# Patient Record
Sex: Female | Born: 1985 | Race: Black or African American | Hispanic: No | Marital: Married | Smoking: Never smoker
Health system: Southern US, Community
[De-identification: ages and names within clinical notes are randomized; demographics above are authoritative.]

## PROBLEM LIST (undated history)

## (undated) ENCOUNTER — Inpatient Hospital Stay (HOSPITAL_COMMUNITY): Payer: Self-pay

## (undated) DIAGNOSIS — F909 Attention-deficit hyperactivity disorder, unspecified type: Secondary | ICD-10-CM

## (undated) DIAGNOSIS — Z8619 Personal history of other infectious and parasitic diseases: Secondary | ICD-10-CM

## (undated) DIAGNOSIS — R519 Headache, unspecified: Secondary | ICD-10-CM

## (undated) DIAGNOSIS — Z8669 Personal history of other diseases of the nervous system and sense organs: Secondary | ICD-10-CM

## (undated) DIAGNOSIS — B019 Varicella without complication: Secondary | ICD-10-CM

## (undated) DIAGNOSIS — R51 Headache: Secondary | ICD-10-CM

## (undated) HISTORY — DX: Attention-deficit hyperactivity disorder, unspecified type: F90.9

## (undated) HISTORY — DX: Varicella without complication: B01.9

## (undated) HISTORY — DX: Headache, unspecified: R51.9

## (undated) HISTORY — DX: Headache: R51

## (undated) HISTORY — PX: CHOLECYSTECTOMY: SHX55

## (undated) SURGERY — Surgical Case
Anesthesia: *Unknown

---

## 2012-03-23 ENCOUNTER — Emergency Department (HOSPITAL_COMMUNITY)
Admission: EM | Admit: 2012-03-23 | Discharge: 2012-03-23 | Disposition: A | Discharge status: Home / Self Care | Payer: Self-pay | Attending: Emergency Medicine | Admitting: Emergency Medicine

## 2012-03-23 ENCOUNTER — Encounter (HOSPITAL_COMMUNITY): Payer: Self-pay | Admitting: Emergency Medicine

## 2012-03-23 DIAGNOSIS — G43909 Migraine, unspecified, not intractable, without status migrainosus: Secondary | ICD-10-CM | POA: Insufficient documentation

## 2012-03-23 HISTORY — DX: Personal history of other diseases of the nervous system and sense organs: Z86.69

## 2012-03-23 MED ORDER — SODIUM CHLORIDE 0.9 % IV BOLUS (SEPSIS)
1000.0000 mL | Freq: Once | INTRAVENOUS | Status: AC
  Administered 2012-03-23: 1000 mL via INTRAVENOUS

## 2012-03-23 MED ORDER — DIPHENHYDRAMINE HCL 50 MG/ML IJ SOLN
25.0000 mg | Freq: Once | INTRAMUSCULAR | Status: AC
  Administered 2012-03-23: 25 mg via INTRAVENOUS
  Filled 2012-03-23: qty 1

## 2012-03-23 MED ORDER — METOCLOPRAMIDE HCL 5 MG/ML IJ SOLN
10.0000 mg | Freq: Once | INTRAMUSCULAR | Status: AC
  Administered 2012-03-23: 10 mg via INTRAVENOUS
  Filled 2012-03-23: qty 2

## 2012-03-23 MED ORDER — SUMATRIPTAN SUCCINATE 50 MG PO TABS: 50.0000 mg | ORAL_TABLET | ORAL | Status: DC | PRN

## 2012-03-23 MED ORDER — KETOROLAC TROMETHAMINE 30 MG/ML IJ SOLN
30.0000 mg | Freq: Once | INTRAMUSCULAR | Status: AC
  Administered 2012-03-23: 30 mg via INTRAVENOUS
  Filled 2012-03-23: qty 1

## 2012-03-23 NOTE — ED Notes (Signed)
Vital signs stable. 

## 2012-03-23 NOTE — ED Notes (Signed)
Anxiety noted upon arrival.

## 2012-03-23 NOTE — ED Notes (Signed)
Family at bedside. 

## 2012-03-23 NOTE — ED Notes (Signed)
MD at bedside. 

## 2012-03-23 NOTE — ED Provider Notes (Signed)
History     CSN: [id number]  Arrival date & time 03/23/12  1144   First MD Initiated Contact with Patient 03/23/12 1147      Chief Complaint  Patient presents with  . Migraine    HPI The patient presents to the emergency room with complaints of migraine headache. She has a history of these headaches and generally just takes ibuprofen. Occasionally the headaches will be more severe like this episode today. She has been to the emergency room for a headache that has been this bad. She denies fever or , vomiting or diarrhea. She does have nausea. This headache started today gradually and has become more severe. It is primarily located on the left side. She's also had some nausea blurred vision and dizziness. Past Medical History  Diagnosis Date  . Hx of migraines     History reviewed. No pertinent past surgical history.  History reviewed. No pertinent family history.  History  Substance Use Topics  . Smoking status: Never Smoker   . Smokeless tobacco: Not on file  . Alcohol Use: No    OB History    Grav Para Term Preterm Abortions TAB SAB Ect Mult Living                  Review of Systems  Constitutional: Negative for fever.  HENT: Negative for neck stiffness.   Neurological: Positive for numbness and headaches. Negative for seizures.  All other systems reviewed and are negative.    Allergies  Review of patient's allergies indicates no known allergies.  Home Medications  No current outpatient prescriptions on file.  BP 130/65  Pulse 79  Resp 16  SpO2 100%  LMP 02/08/2012  Physical Exam  Nursing note and vitals reviewed. Constitutional: She appears well-developed and well-nourished. She appears distressed.  HENT:  Head: Normocephalic and atraumatic.  Right Ear: External ear normal.  Left Ear: External ear normal.  Eyes: Conjunctivae are normal. Right eye exhibits no discharge. Left eye exhibits no discharge. No scleral icterus.  Neck: Neck supple. No  tracheal deviation present.  Cardiovascular: Normal rate, regular rhythm and intact distal pulses.   Pulmonary/Chest: Breath sounds normal. No stridor. No respiratory distress. She has no wheezes. She has no rales.       Hyperventilating  Abdominal: Soft. Bowel sounds are normal. She exhibits no distension. There is no tenderness. There is no rebound and no guarding.  Musculoskeletal: She exhibits no edema and no tenderness.  Neurological: She is alert. She has normal strength. No sensory deficit. Cranial nerve deficit:  no gross defecits noted. She exhibits normal muscle tone. She displays no seizure activity. Coordination normal.       Equal grip strength and plantar flexion strength bilaterally, sensation of light touch is intact throughout  Skin: Skin is warm and dry. No rash noted.  Psychiatric: She has a normal mood and affect.    ED Course  Procedures (including critical care time)  Labs Reviewed - No data to display No results found.   1. Migraine       MDM  Patient is feeling better after treatment at this time.  Her symptoms are consistent with a migraine headache. Doubt subarachnoid hemorrhage, meningitis or other acute neurological emergency. She was discharged home with a prescription for Imitrex. I recommended followup with a primary care Dr.       Celene Kras, MD 03/23/12 1350

## 2012-03-23 NOTE — ED Notes (Signed)
Per EMS: Pt's husband called EMS for wife's migraine.  Pt has hx of same.  Starts with numbness/tingling in mouth.  C/o blurred vision, nausea, dizziness.  Pain is centered over left eye.  Pt states it started when she woke up around 1045.  Took phenergan for nausea.  Denies taking any other meds today.

## 2013-10-18 LAB — OB RESULTS CONSOLE RUBELLA ANTIBODY, IGM: Rubella: IMMUNE

## 2013-10-18 LAB — OB RESULTS CONSOLE RPR: RPR: NONREACTIVE

## 2013-10-18 LAB — OB RESULTS CONSOLE ABO/RH: RH Type: POSITIVE

## 2013-10-18 LAB — OB RESULTS CONSOLE HIV ANTIBODY (ROUTINE TESTING): HIV: NONREACTIVE

## 2013-10-18 LAB — OB RESULTS CONSOLE HEPATITIS B SURFACE ANTIGEN: Hepatitis B Surface Ag: NEGATIVE

## 2013-10-18 LAB — OB RESULTS CONSOLE ANTIBODY SCREEN: Antibody Screen: NEGATIVE

## 2014-01-18 ENCOUNTER — Emergency Department (HOSPITAL_COMMUNITY)
Admission: EM | Admit: 2014-01-18 | Discharge: 2014-01-18 | Disposition: A | Discharge status: Home / Self Care | Payer: Managed Care, Other (non HMO) | Attending: Emergency Medicine | Admitting: Emergency Medicine

## 2014-01-18 ENCOUNTER — Encounter (HOSPITAL_COMMUNITY): Payer: Self-pay | Admitting: Emergency Medicine

## 2014-01-18 DIAGNOSIS — O9989 Other specified diseases and conditions complicating pregnancy, childbirth and the puerperium: Secondary | ICD-10-CM | POA: Insufficient documentation

## 2014-01-18 DIAGNOSIS — Z79899 Other long term (current) drug therapy: Secondary | ICD-10-CM | POA: Insufficient documentation

## 2014-01-18 DIAGNOSIS — G43909 Migraine, unspecified, not intractable, without status migrainosus: Secondary | ICD-10-CM | POA: Insufficient documentation

## 2014-01-18 DIAGNOSIS — K6289 Other specified diseases of anus and rectum: Secondary | ICD-10-CM

## 2014-01-18 LAB — URINALYSIS, ROUTINE W REFLEX MICROSCOPIC
Bilirubin Urine: NEGATIVE
Glucose, UA: NEGATIVE mg/dL
Hgb urine dipstick: NEGATIVE
Ketones, ur: NEGATIVE mg/dL
Leukocytes, UA: NEGATIVE
Nitrite: NEGATIVE
Protein, ur: NEGATIVE mg/dL
Specific Gravity, Urine: 1.021 (ref 1.005–1.030)
Urobilinogen, UA: 0.2 mg/dL (ref 0.0–1.0)
pH: 6.5 (ref 5.0–8.0)

## 2014-01-18 NOTE — Progress Notes (Signed)
Spoke to Dr Harrington Challenger. To keep next scheduled appointment.  Pt has been cleared obstetrically.

## 2014-01-18 NOTE — ED Notes (Signed)
Pt escorted to discharge window. Verbalized understanding discharge instructions. In no acute distress.   

## 2014-01-18 NOTE — ED Notes (Signed)
Pt is [redacted] weeks pregnant, no problems, regular prenatal care

## 2014-01-18 NOTE — ED Notes (Signed)
Pt complains of rectal pressure since Tuesday, now she complains of rectal pain

## 2014-01-18 NOTE — ED Notes (Signed)
Pt alert and oriented x4. Respirations even and unlabored, bilateral symmetrical rise and fall of chest. Skin warm and dry. In no acute distress. Denies needs.   

## 2014-01-18 NOTE — Progress Notes (Signed)
OB Rapid Response: Pt 25 weeks c/o pressure in the rectum, not like a bowel movement, pain when passing gas for last 24-48 hours. G3P2 (two previous c/sections). No c/o uterine contractions, ROM, vag bleeding. Reports baby very active. FH 150 with minimal variability but pt states she has not eaten since last pm, no contractions noted on ext monitor. Gets routine prenatal care at [facility].

## 2014-01-18 NOTE — ED Notes (Signed)
TOCO monitor applied to patient Dr. [name] Punt at bedside

## 2014-01-18 NOTE — ED Provider Notes (Signed)
CSN: [id number]     Arrival date & time 01/18/14  9678 History   First MD Initiated Contact with Patient 01/18/14 [phone]     Chief Complaint  Patient presents with  . Rectal Pain     (Consider location/radiation/quality/duration/timing/severity/associated sxs/prior Treatment) HPI Comments: Patient who is [redacted] weeks pregnant, presents with rectal pressure. She states it started 2 days ago with pressure in her vaginal area and now she feels it's more pressure in her rectum. She denies any pain with bowel movements. She does see she's having normal bowel movements with no constipation or hard stools. She denies any abdominal pain or cramping. She denies any vaginal discharge or leakage of fluid. She denies any nausea or vomiting. She denies any fevers or chills. She denies any history of hemorrhoids.   Past Medical History  Diagnosis Date  . Hx of migraines    Past Surgical History  Procedure Laterality Date  . Cesarean section     History reviewed. No pertinent family history. History  Substance Use Topics  . Smoking status: Never Smoker   . Smokeless tobacco: Not on file  . Alcohol Use: No   OB History   Grav Para Term Preterm Abortions TAB SAB Ect Mult Living   3 2             Review of Systems  Constitutional: Negative for fever, chills, diaphoresis and fatigue.  HENT: Negative for congestion, rhinorrhea and sneezing.   Eyes: Negative.   Respiratory: Negative for cough, chest tightness and shortness of breath.   Cardiovascular: Negative for chest pain and leg swelling.  Gastrointestinal: Positive for rectal pain. Negative for nausea, vomiting, abdominal pain, diarrhea and blood in stool.  Genitourinary: Negative for frequency, hematuria, flank pain and difficulty urinating.  Musculoskeletal: Negative for arthralgias and back pain.  Skin: Negative for rash.  Neurological: Negative for dizziness, speech difficulty, weakness, numbness and headaches.      Allergies  Review  of patient's allergies indicates no known allergies.  Home Medications   Prior to Admission medications   Medication Sig Start Date End Date Taking? Authorizing Provider  Prenatal Vit-Fe Fumarate-FA (PRENATAL MULTIVITAMIN) TABS tablet Take 1 tablet by mouth daily at 12 noon.   Yes Historical Provider, MD  SUMAtriptan (IMITREX) 50 MG tablet Take 50 mg by mouth every 2 (two) hours as needed for migraine or headache. May repeat in 2 hours if headache persists or recurs.   Yes Historical Provider, MD  SUMAtriptan (IMITREX) 50 MG tablet Take 1 tablet (50 mg total) by mouth every 2 (two) hours as needed for migraine (max 2 doses per 24 hours). 03/23/12 03/23/13  Kathalene Frames, MD   BP 129/74  Pulse 97  Temp(Src) 98.9 F (37.2 C) (Oral)  Resp 20  Ht 5\' 6"  (1.676 m)  Wt 243 lb (110.224 kg)  BMI 39.24 kg/m2  SpO2 100% Physical Exam  Constitutional: She is oriented to person, place, and time. She appears well-developed and well-nourished.  HENT:  Head: Normocephalic and atraumatic.  Eyes: Pupils are equal, round, and reactive to light.  Neck: Normal range of motion. Neck supple.  Cardiovascular: Normal rate, regular rhythm and normal heart sounds.   Pulmonary/Chest: Effort normal and breath sounds normal. No respiratory distress. She has no wheezes. She has no rales. She exhibits no tenderness.  Abdominal: Soft. Bowel sounds are normal. There is no tenderness. There is no rebound and no guarding.  Genitourinary:  Rectal exam reveals no hemorrhoids. There's no pain on  rectal exam. There is no pain to the anus or the. Rectal area. There's no significant stool burden in the rectal vault.  Stool is soft and brown without gross blood.  Musculoskeletal: Normal range of motion. She exhibits no edema.  Lymphadenopathy:    She has no cervical adenopathy.  Neurological: She is alert and oriented to person, place, and time.  Skin: Skin is warm and dry. No rash noted.  Psychiatric: She has a normal mood and  affect.    ED Course  Procedures (including critical care time) Labs Review Labs Reviewed  URINALYSIS, ROUTINE W REFLEX MICROSCOPIC - Abnormal; Notable for the following:    APPearance CLOUDY (*)    All other components within normal limits    Imaging Review No results found.   EKG Interpretation None      MDM   Final diagnoses:  Rectal pain    Patient presents with rectal pressure/pain. She had no pain on rectal exam. There is no findings that would be more consistent with a perirectal abscess. I don't visualize any hemorrhoids. There's no pain to be more suggestive of an anal fissure. She was placed on the fetal monitor and the proper response OB nurse has assessed the patient. She is discussed the patient with Dr. Vanessa Kick who is the patient's primary OB/GYN. The patient has been cleared to go home from an OB standpoint. She has an appointment to followup with Dr. Harrington Challenger on May 19. She's been advised to call for sooner appointment if her symptoms are not improving or followup at the [facility] if she has any worsening symptoms. Dr. Harrington Challenger called in a prescription for lidocaine jelly for the patient in case she had some hemorrhoids that were higher up.    Malvin Johns, MD 01/18/14 [phone]

## 2014-01-18 NOTE — ED Notes (Signed)
Rapid response nurse on way

## 2014-01-18 NOTE — ED Notes (Signed)
Patient states that she is [redacted] weeks pregnant and has regular prenatal care with Dr. Ouida Sills at [facility] Per patient, she had her glucose tolerance test last Tuesday and began experiencing both rectal and vaginal pain/pressure Patient states that vaginal pain/pressure has since resolved, but that she continues to experience rectal pressure which she states has become "more pain, than pressure." Patient currently rates pain 6/10 on pain scale Patient states that she has normal BM, but that "it hurts to pass gas." Patient denies rectal bleeding, vaginal bleeding, vaginal discharge or leaking of fluid from the vagina Rapid response OB nurse called and made aware of patient Patient complaints d/w Delsa Sale, RN and per [facility] RN, fetal heart tones via doppler to be obtained and documented

## 2014-02-07 ENCOUNTER — Encounter: Payer: Managed Care, Other (non HMO) | Attending: Obstetrics and Gynecology

## 2014-02-07 VITALS — Ht 66.0 in | Wt 244.9 lb

## 2014-02-07 DIAGNOSIS — Z713 Dietary counseling and surveillance: Secondary | ICD-10-CM | POA: Insufficient documentation

## 2014-02-07 DIAGNOSIS — O99810 Abnormal glucose complicating pregnancy: Secondary | ICD-10-CM | POA: Insufficient documentation

## 2014-02-08 NOTE — Progress Notes (Signed)
  Patient was seen on 02/07/14 for Gestational Diabetes self-management class at the [facility]. The following learning objectives were met by the patient during this course:   States the definition of Gestational Diabetes  States why dietary management is important in controlling blood glucose  Describes the effects of carbohydrates on blood glucose levels  Demonstrates ability to create a balanced meal plan  Demonstrates carbohydrate counting   States when to check blood glucose levels  Demonstrates proper blood glucose monitoring techniques  States the effect of stress and exercise on blood glucose levels  States the importance of limiting caffeine and abstaining from alcohol and smoking  Plan:  Aim for 2 Carb Choices per meal (30 grams) +/- 1 either way for breakfast Aim for 3 Carb Choices per meal (45 grams) +/- 1 either way from lunch and dinner Aim for 1-2 Carbs per snack Begin reading food labels for Total Carbohydrate and sugar grams of foods Consider  increasing your activity level by walking daily as tolerated Begin checking BG before breakfast and 1-2 hours after first bit of breakfast, lunch and dinner after  as directed by MD  Take medication  as directed by MD  Blood glucose monitor given:  One Touch Ultra Mini Self Monitoring Kit Lot # [id number] X Exp: 11/2014 Blood glucose reading: $RemoveBeforeDE'77mg'[id number]$ /dl  Patient instructed to monitor glucose levels: FBS: 60 - <90 2 hour: <120  Patient received the following handouts:  Nutrition Diabetes and Pregnancy  Carbohydrate Counting List  Meal Planning worksheet  Patient will be seen for follow-up as needed.

## 2014-04-16 ENCOUNTER — Encounter (HOSPITAL_COMMUNITY): Payer: Self-pay | Admitting: Pharmacist

## 2014-04-21 DIAGNOSIS — G510 Bell's palsy: Secondary | ICD-10-CM | POA: Insufficient documentation

## 2014-04-27 ENCOUNTER — Encounter (HOSPITAL_COMMUNITY)
Admission: RE | Admit: 2014-04-27 | Discharge: 2014-04-27 | Disposition: A | Discharge status: Home / Self Care | Payer: Managed Care, Other (non HMO) | Source: Ambulatory Visit | Attending: Obstetrics and Gynecology | Admitting: Obstetrics and Gynecology

## 2014-04-27 ENCOUNTER — Encounter (HOSPITAL_COMMUNITY): Payer: Self-pay

## 2014-04-27 LAB — ABO/RH: ABO/RH(D): B POS

## 2014-04-27 LAB — CBC
HCT: 39.2 % (ref 36.0–46.0)
Hemoglobin: 13.2 g/dL (ref 12.0–15.0)
MCH: 26.5 pg (ref 26.0–34.0)
MCHC: 33.7 g/dL (ref 30.0–36.0)
MCV: 78.7 fL (ref 78.0–100.0)
Platelets: 142 10*3/uL — ABNORMAL LOW (ref 150–400)
RBC: 4.98 MIL/uL (ref 3.87–5.11)
RDW: 14.5 % (ref 11.5–15.5)
WBC: 8.9 10*3/uL (ref 4.0–10.5)

## 2014-04-27 LAB — TYPE AND SCREEN
ABO/RH(D): B POS
Antibody Screen: NEGATIVE

## 2014-04-27 NOTE — Patient Instructions (Signed)
[place]  04/27/2014   Your procedure is scheduled on:  04/30/14  Enter through the Main Entrance of [facility] at [place] up the phone at the desk and dial [phone].   Call this number if you have problems the morning of surgery: [phone]   Remember:   Do not eat food:After Midnight.  Do not drink clear liquids: 4 Hours before arrival.  Take these medicines the morning of surgery with A SIP OF WATER: NA   Do not wear jewelry, make-up or nail polish.  Do not wear lotions, powders, or perfumes. You may wear deodorant.  Do not shave 48 hours prior to surgery.  Do not bring valuables to the hospital.  [facility] is not   responsible for any belongings or valuables brought to the hospital.  Contacts, dentures or bridgework may not be worn into surgery.  Leave suitcase in the car. After surgery it may be brought to your room.  For patients admitted to the hospital, checkout time is 11:00 AM the day of              discharge.   Patients discharged the day of surgery will not be allowed to drive             home.  Name and phone number of your driver: NA  Special Instructions:      Please read over the following fact sheets that you were given:   Surgical Site Infection Prevention

## 2014-04-27 NOTE — Pre-Procedure Instructions (Signed)
Platelet count of 142 called to Rudean Curt, MD. No orders given.

## 2014-04-28 LAB — RPR

## 2014-04-30 ENCOUNTER — Inpatient Hospital Stay (HOSPITAL_COMMUNITY)
Admission: RE | Admit: 2014-04-30 | Discharge: 2014-05-02 | DRG: 765 | Disposition: A | Discharge status: Home / Self Care | Payer: Managed Care, Other (non HMO) | Source: Ambulatory Visit | Attending: Obstetrics and Gynecology | Admitting: Obstetrics and Gynecology

## 2014-04-30 ENCOUNTER — Encounter (HOSPITAL_COMMUNITY): Payer: Managed Care, Other (non HMO) | Admitting: Anesthesiology

## 2014-04-30 ENCOUNTER — Inpatient Hospital Stay (HOSPITAL_COMMUNITY): Payer: Managed Care, Other (non HMO) | Admitting: Anesthesiology

## 2014-04-30 ENCOUNTER — Encounter (HOSPITAL_COMMUNITY): Payer: Self-pay | Admitting: Anesthesiology

## 2014-04-30 ENCOUNTER — Encounter (HOSPITAL_COMMUNITY)
Admission: RE | Disposition: A | Discharge status: Home / Self Care | Payer: Self-pay | Source: Ambulatory Visit | Attending: Obstetrics and Gynecology

## 2014-04-30 DIAGNOSIS — E669 Obesity, unspecified: Secondary | ICD-10-CM | POA: Diagnosis present

## 2014-04-30 DIAGNOSIS — O99214 Obesity complicating childbirth: Secondary | ICD-10-CM

## 2014-04-30 DIAGNOSIS — O34219 Maternal care for unspecified type scar from previous cesarean delivery: Principal | ICD-10-CM | POA: Diagnosis present

## 2014-04-30 DIAGNOSIS — Z6841 Body Mass Index (BMI) 40.0 and over, adult: Secondary | ICD-10-CM

## 2014-04-30 DIAGNOSIS — O99814 Abnormal glucose complicating childbirth: Secondary | ICD-10-CM | POA: Diagnosis present

## 2014-04-30 LAB — PLATELET COUNT: Platelets: 154 10*3/uL (ref 150–400)

## 2014-04-30 LAB — GLUCOSE, CAPILLARY
Glucose-Capillary: 85 mg/dL (ref 70–99)
Glucose-Capillary: 71 mg/dL (ref 70–99)

## 2014-04-30 SURGERY — Surgical Case
Anesthesia: Spinal | Site: Abdomen

## 2014-04-30 MED ORDER — LACTATED RINGERS IV SOLN
INTRAVENOUS | Status: DC | PRN
  Administered 2014-04-30: 12:00:00 via INTRAVENOUS

## 2014-04-30 MED ORDER — SCOPOLAMINE 1 MG/3DAYS TD PT72
1.0000 | MEDICATED_PATCH | Freq: Once | TRANSDERMAL | Status: DC
  Administered 2014-04-30: 1.5 mg via TRANSDERMAL

## 2014-04-30 MED ORDER — KETOROLAC TROMETHAMINE 30 MG/ML IJ SOLN
30.0000 mg | Freq: Four times a day (QID) | INTRAMUSCULAR | Status: AC | PRN
  Administered 2014-04-30: 30 mg via INTRAMUSCULAR

## 2014-04-30 MED ORDER — METOCLOPRAMIDE HCL 5 MG/ML IJ SOLN
INTRAMUSCULAR | Status: DC | PRN
  Administered 2014-04-30 (×2): 5 mg via INTRAVENOUS

## 2014-04-30 MED ORDER — PHENYLEPHRINE HCL 10 MG/ML IJ SOLN
INTRAMUSCULAR | Status: DC | PRN
  Administered 2014-04-30: 40 ug via INTRAVENOUS

## 2014-04-30 MED ORDER — DIPHENHYDRAMINE HCL 50 MG/ML IJ SOLN: 25.0000 mg | INTRAMUSCULAR | Status: DC | PRN

## 2014-04-30 MED ORDER — TETANUS-DIPHTH-ACELL PERTUSSIS 5-2.5-18.5 LF-MCG/0.5 IM SUSP
0.5000 mL | Freq: Once | INTRAMUSCULAR | Status: AC
  Administered 2014-05-01: 0.5 mL via INTRAMUSCULAR
  Filled 2014-04-30: qty 0.5

## 2014-04-30 MED ORDER — KETOROLAC TROMETHAMINE 30 MG/ML IJ SOLN
INTRAMUSCULAR | Status: AC
  Filled 2014-04-30: qty 1

## 2014-04-30 MED ORDER — LACTATED RINGERS IV SOLN
INTRAVENOUS | Status: DC
  Administered 2014-04-30 (×3): via INTRAVENOUS

## 2014-04-30 MED ORDER — MEPERIDINE HCL 25 MG/ML IJ SOLN
INTRAMUSCULAR | Status: AC
  Administered 2014-04-30: 6.25 mg via INTRAVENOUS
  Filled 2014-04-30: qty 1

## 2014-04-30 MED ORDER — NALBUPHINE HCL 10 MG/ML IJ SOLN
5.0000 mg | INTRAMUSCULAR | Status: DC | PRN
  Administered 2014-04-30: 10 mg via SUBCUTANEOUS

## 2014-04-30 MED ORDER — SIMETHICONE 80 MG PO CHEW: 80.0000 mg | CHEWABLE_TABLET | ORAL | Status: DC | PRN

## 2014-04-30 MED ORDER — MENTHOL 3 MG MT LOZG: 1.0000 | LOZENGE | OROMUCOSAL | Status: DC | PRN

## 2014-04-30 MED ORDER — DIPHENHYDRAMINE HCL 50 MG/ML IJ SOLN
INTRAMUSCULAR | Status: DC | PRN
  Administered 2014-04-30: 25 mg via INTRAVENOUS

## 2014-04-30 MED ORDER — ONDANSETRON HCL 4 MG PO TABS: 4.0000 mg | ORAL_TABLET | ORAL | Status: DC | PRN

## 2014-04-30 MED ORDER — CEFAZOLIN SODIUM-DEXTROSE 2-3 GM-% IV SOLR
2.0000 g | INTRAVENOUS | Status: AC
  Administered 2014-04-30: 2 g via INTRAVENOUS
  Filled 2014-04-30: qty 50

## 2014-04-30 MED ORDER — SIMETHICONE 80 MG PO CHEW
80.0000 mg | CHEWABLE_TABLET | Freq: Three times a day (TID) | ORAL | Status: DC
  Administered 2014-05-01 – 2014-05-02 (×4): 80 mg via ORAL
  Filled 2014-04-30 (×3): qty 1

## 2014-04-30 MED ORDER — NALOXONE HCL 1 MG/ML IJ SOLN: 1.0000 ug/kg/h | INTRAVENOUS | Status: DC | PRN

## 2014-04-30 MED ORDER — LACTATED RINGERS IV SOLN
INTRAVENOUS | Status: DC
  Administered 2014-04-30: 21:00:00 via INTRAVENOUS

## 2014-04-30 MED ORDER — SCOPOLAMINE 1 MG/3DAYS TD PT72
MEDICATED_PATCH | TRANSDERMAL | Status: AC
  Filled 2014-04-30: qty 1

## 2014-04-30 MED ORDER — DIBUCAINE 1 % RE OINT
1.0000 | TOPICAL_OINTMENT | RECTAL | Status: DC | PRN
Start: 2014-04-30 — End: 2014-05-02

## 2014-04-30 MED ORDER — BUPIVACAINE IN DEXTROSE 0.75-8.25 % IT SOLN
INTRATHECAL | Status: DC | PRN
  Administered 2014-04-30: 1.7 mL via INTRATHECAL

## 2014-04-30 MED ORDER — WITCH HAZEL-GLYCERIN EX PADS
1.0000 | MEDICATED_PAD | CUTANEOUS | Status: DC | PRN
Start: 2014-04-30 — End: 2014-05-02

## 2014-04-30 MED ORDER — LANOLIN HYDROUS EX OINT
1.0000 | TOPICAL_OINTMENT | CUTANEOUS | Status: DC | PRN
Start: 2014-04-30 — End: 2014-05-02

## 2014-04-30 MED ORDER — MORPHINE SULFATE 0.5 MG/ML IJ SOLN
INTRAMUSCULAR | Status: AC
  Filled 2014-04-30: qty 10

## 2014-04-30 MED ORDER — KETOROLAC TROMETHAMINE 30 MG/ML IJ SOLN: 30.0000 mg | Freq: Four times a day (QID) | INTRAMUSCULAR | Status: AC | PRN

## 2014-04-30 MED ORDER — FENTANYL CITRATE 0.05 MG/ML IJ SOLN: 25.0000 ug | INTRAMUSCULAR | Status: DC | PRN

## 2014-04-30 MED ORDER — MORPHINE SULFATE (PF) 0.5 MG/ML IJ SOLN
INTRAMUSCULAR | Status: DC | PRN
  Administered 2014-04-30: .15 mg via INTRATHECAL

## 2014-04-30 MED ORDER — OXYCODONE-ACETAMINOPHEN 5-325 MG PO TABS
1.0000 | ORAL_TABLET | ORAL | Status: DC | PRN
  Administered 2014-05-01 – 2014-05-02 (×5): 1 via ORAL
  Filled 2014-04-30 (×5): qty 1

## 2014-04-30 MED ORDER — FENTANYL CITRATE 0.05 MG/ML IJ SOLN
INTRAMUSCULAR | Status: DC | PRN
  Administered 2014-04-30: 25 ug via INTRATHECAL

## 2014-04-30 MED ORDER — ONDANSETRON HCL 4 MG/2ML IJ SOLN: 4.0000 mg | Freq: Three times a day (TID) | INTRAMUSCULAR | Status: DC | PRN

## 2014-04-30 MED ORDER — PHENYLEPHRINE 8 MG IN D5W 100 ML (0.08MG/ML) PREMIX OPTIME
INJECTION | INTRAVENOUS | Status: DC | PRN
  Administered 2014-04-30: 40 ug/min via INTRAVENOUS

## 2014-04-30 MED ORDER — ONDANSETRON HCL 4 MG/2ML IJ SOLN
INTRAMUSCULAR | Status: DC | PRN
  Administered 2014-04-30: 4 mg via INTRAVENOUS

## 2014-04-30 MED ORDER — OXYTOCIN 10 UNIT/ML IJ SOLN
INTRAMUSCULAR | Status: AC
  Filled 2014-04-30: qty 4

## 2014-04-30 MED ORDER — DIPHENHYDRAMINE HCL 25 MG PO CAPS
25.0000 mg | ORAL_CAPSULE | Freq: Four times a day (QID) | ORAL | Status: DC | PRN
  Administered 2014-05-01 (×2): 25 mg via ORAL
  Filled 2014-04-30 (×2): qty 1

## 2014-04-30 MED ORDER — NALBUPHINE HCL 10 MG/ML IJ SOLN: 5.0000 mg | INTRAMUSCULAR | Status: DC | PRN

## 2014-04-30 MED ORDER — NALBUPHINE HCL 10 MG/ML IJ SOLN
INTRAMUSCULAR | Status: AC
  Administered 2014-04-30: 10 mg via SUBCUTANEOUS
  Filled 2014-04-30: qty 1

## 2014-04-30 MED ORDER — LACTATED RINGERS IV SOLN
40.0000 [IU] | INTRAVENOUS | Status: DC | PRN
  Administered 2014-04-30: 40 [IU] via INTRAVENOUS

## 2014-04-30 MED ORDER — METOCLOPRAMIDE HCL 5 MG/ML IJ SOLN: 10.0000 mg | Freq: Three times a day (TID) | INTRAMUSCULAR | Status: DC | PRN

## 2014-04-30 MED ORDER — DIPHENHYDRAMINE HCL 50 MG/ML IJ SOLN
12.5000 mg | INTRAMUSCULAR | Status: DC | PRN
Start: 2014-04-30 — End: 2014-05-02
  Administered 2014-05-01: 12.5 mg via INTRAVENOUS
  Filled 2014-04-30: qty 1

## 2014-04-30 MED ORDER — DIPHENHYDRAMINE HCL 25 MG PO CAPS: 25.0000 mg | ORAL_CAPSULE | ORAL | Status: DC | PRN

## 2014-04-30 MED ORDER — PHENYLEPHRINE 8 MG IN D5W 100 ML (0.08MG/ML) PREMIX OPTIME
INJECTION | INTRAVENOUS | Status: AC
  Filled 2014-04-30: qty 100

## 2014-04-30 MED ORDER — ONDANSETRON HCL 4 MG/2ML IJ SOLN
INTRAMUSCULAR | Status: AC
  Filled 2014-04-30: qty 2

## 2014-04-30 MED ORDER — NALOXONE HCL 0.4 MG/ML IJ SOLN: 0.4000 mg | INTRAMUSCULAR | Status: DC | PRN

## 2014-04-30 MED ORDER — OXYTOCIN 40 UNITS IN LACTATED RINGERS INFUSION - SIMPLE MED: 62.5000 mL/h | INTRAVENOUS | Status: AC

## 2014-04-30 MED ORDER — MEPERIDINE HCL 25 MG/ML IJ SOLN
6.2500 mg | INTRAMUSCULAR | Status: DC | PRN
  Administered 2014-04-30: 6.25 mg via INTRAVENOUS

## 2014-04-30 MED ORDER — ONDANSETRON HCL 4 MG/2ML IJ SOLN: 4.0000 mg | INTRAMUSCULAR | Status: DC | PRN

## 2014-04-30 MED ORDER — IBUPROFEN 600 MG PO TABS
600.0000 mg | ORAL_TABLET | Freq: Four times a day (QID) | ORAL | Status: DC
  Administered 2014-04-30 – 2014-05-02 (×7): 600 mg via ORAL
  Filled 2014-04-30 (×7): qty 1

## 2014-04-30 MED ORDER — SENNOSIDES-DOCUSATE SODIUM 8.6-50 MG PO TABS
2.0000 | ORAL_TABLET | ORAL | Status: DC
  Administered 2014-04-30 – 2014-05-01 (×2): 2 via ORAL
  Filled 2014-04-30 (×2): qty 2

## 2014-04-30 MED ORDER — CEFAZOLIN SODIUM-DEXTROSE 2-3 GM-% IV SOLR
INTRAVENOUS | Status: AC
  Filled 2014-04-30: qty 50

## 2014-04-30 MED ORDER — SIMETHICONE 80 MG PO CHEW
80.0000 mg | CHEWABLE_TABLET | ORAL | Status: DC
  Administered 2014-04-30 – 2014-05-01 (×2): 80 mg via ORAL
  Filled 2014-04-30 (×2): qty 1

## 2014-04-30 MED ORDER — FENTANYL CITRATE 0.05 MG/ML IJ SOLN
INTRAMUSCULAR | Status: AC
  Filled 2014-04-30: qty 2

## 2014-04-30 MED ORDER — ZOLPIDEM TARTRATE 5 MG PO TABS: 5.0000 mg | ORAL_TABLET | Freq: Every evening | ORAL | Status: DC | PRN

## 2014-04-30 MED ORDER — SODIUM CHLORIDE 0.9 % IJ SOLN: 3.0000 mL | INTRAMUSCULAR | Status: DC | PRN

## 2014-04-30 MED ORDER — PRENATAL MULTIVITAMIN CH
1.0000 | ORAL_TABLET | Freq: Every day | ORAL | Status: DC
  Administered 2014-05-01 – 2014-05-02 (×2): 1 via ORAL
  Filled 2014-04-30 (×2): qty 1

## 2014-04-30 MED ORDER — DIPHENHYDRAMINE HCL 50 MG/ML IJ SOLN
INTRAMUSCULAR | Status: AC
  Filled 2014-04-30: qty 1

## 2014-04-30 SURGICAL SUPPLY — 33 items
BLADE SURG 10 STRL SS (BLADE) ×4 IMPLANT
CLAMP CORD UMBIL (MISCELLANEOUS) IMPLANT
CLOTH BEACON ORANGE TIMEOUT ST (SAFETY) ×2 IMPLANT
DRAPE LG THREE QUARTER DISP (DRAPES) IMPLANT
DRSG OPSITE POSTOP 4X10 (GAUZE/BANDAGES/DRESSINGS) ×2 IMPLANT
DRSG TELFA 3X8 NADH (GAUZE/BANDAGES/DRESSINGS) IMPLANT
DURAPREP 26ML APPLICATOR (WOUND CARE) ×2 IMPLANT
ELECT REM PT RETURN 9FT ADLT (ELECTROSURGICAL) ×2
ELECTRODE REM PT RTRN 9FT ADLT (ELECTROSURGICAL) ×1 IMPLANT
EXTRACTOR VACUUM M CUP 4 TUBE (SUCTIONS) IMPLANT
GLOVE BIOGEL PI IND STRL 6.5 (GLOVE) ×1 IMPLANT
GLOVE BIOGEL PI INDICATOR 6.5 (GLOVE) ×1
GLOVE ECLIPSE 6.5 STRL STRAW (GLOVE) ×2 IMPLANT
GOWN STRL REUS W/TWL LRG LVL3 (GOWN DISPOSABLE) ×4 IMPLANT
HEMOSTAT SURGICEL 4X8 (HEMOSTASIS) ×2 IMPLANT
KIT ABG SYR 3ML LUER SLIP (SYRINGE) IMPLANT
NEEDLE HYPO 25X5/8 SAFETYGLIDE (NEEDLE) IMPLANT
NS IRRIG 1000ML POUR BTL (IV SOLUTION) ×2 IMPLANT
PACK C SECTION WH (CUSTOM PROCEDURE TRAY) ×2 IMPLANT
PAD ABD 7.5X8 STRL (GAUZE/BANDAGES/DRESSINGS) IMPLANT
PAD OB MATERNITY 4.3X12.25 (PERSONAL CARE ITEMS) ×2 IMPLANT
RTRCTR C-SECT PINK 25CM LRG (MISCELLANEOUS) ×2 IMPLANT
STAPLER VISISTAT 35W (STAPLE) IMPLANT
SUT MON AB 2-0 CT1 27 (SUTURE) ×2 IMPLANT
SUT MON AB 4-0 PS1 27 (SUTURE) IMPLANT
SUT PDS AB 0 CTX 60 (SUTURE) IMPLANT
SUT PLAIN 2 0 XLH (SUTURE) IMPLANT
SUT VIC AB 0 CTX 36 (SUTURE) ×4
SUT VIC AB 0 CTX36XBRD ANBCTRL (SUTURE) ×4 IMPLANT
SUT VIC AB 4-0 KS 27 (SUTURE) IMPLANT
TOWEL OR 17X24 6PK STRL BLUE (TOWEL DISPOSABLE) ×2 IMPLANT
TRAY FOLEY CATH 14FR (SET/KITS/TRAYS/PACK) ×2 IMPLANT
WATER STERILE IRR 1000ML POUR (IV SOLUTION) ×2 IMPLANT

## 2014-04-30 NOTE — Progress Notes (Signed)
Ur chart review completed.  

## 2014-04-30 NOTE — Anesthesia Preprocedure Evaluation (Signed)
Anesthesia Evaluation  Patient identified by MRN, date of birth, ID band Patient awake    Reviewed: Allergy & Precautions, H&P , NPO status , Patient's Chart, lab work & pertinent test results  Airway Mallampati: III TM Distance: >3 FB Neck ROM: Full    Dental no notable dental hx. (+) Teeth Intact   Pulmonary neg pulmonary ROS,  breath sounds clear to auscultation  Pulmonary exam normal       Cardiovascular negative cardio ROS  Rhythm:Regular Rate:Normal     Neuro/Psych negative neurological ROS  negative psych ROS   GI/Hepatic negative GI ROS, Neg liver ROS,   Endo/Other  diabetes, GestationalMorbid obesityDiet controlled  Renal/GU negative Renal ROS  negative genitourinary   Musculoskeletal   Abdominal Normal abdominal exam  (+) + obese,   Peds  Hematology   Anesthesia Other Findings   Reproductive/Obstetrics (+) Pregnancy Previous C/Section x 2                           Anesthesia Physical Anesthesia Plan  ASA: III  Anesthesia Plan: Spinal   Post-op Pain Management:    Induction:   Airway Management Planned: Natural Airway  Additional Equipment:   Intra-op Plan:   Post-operative Plan:   Informed Consent: I have reviewed the patients History and Physical, chart, labs and discussed the procedure including the risks, benefits and alternatives for the proposed anesthesia with the patient or authorized representative who has indicated his/her understanding and acceptance.     Plan Discussed with: Anesthesiologist and CRNA  Anesthesia Plan Comments:         Anesthesia Quick Evaluation

## 2014-04-30 NOTE — Transfer of Care (Signed)
Immediate Anesthesia Transfer of Care Note  Patient: [name]  Procedure(s) Performed: Procedure(s): REPEAT CESAREAN SECTION (N/A)  Patient Location: PACU  Anesthesia Type:Spinal  Level of Consciousness: awake, alert  and oriented  Airway & Oxygen Therapy: Patient Spontanous Breathing  Post-op Assessment: Report given to PACU RN and Post -op Vital signs reviewed and stable  Post vital signs: Reviewed and stable  Complications: No apparent anesthesia complications

## 2014-04-30 NOTE — Anesthesia Postprocedure Evaluation (Signed)
  Anesthesia Post-op Note  Patient: [name]  Procedure(s) Performed: Procedure(s): REPEAT CESAREAN SECTION (N/A)  Patient Location: 103  Anesthesia Type:Spinal  Level of Consciousness: awake  Airway and Oxygen Therapy: Patient Spontanous Breathing  Post-op Pain: mild  Post-op Assessment: Patient's Cardiovascular Status Stable and Respiratory Function Stable  Post-op Vital Signs: stable  Last Vitals:  Filed Vitals:   04/30/14 1433  BP: 124/57  Pulse: 73  Temp: 37 C  Resp: 18    Complications: No apparent anesthesia complications

## 2014-04-30 NOTE — Anesthesia Procedure Notes (Signed)
Spinal  Patient location during procedure: OR Start time: 04/30/2014 11:23 AM Staffing Anesthesiologist: Osha Errico A. Performed by: anesthesiologist  Preanesthetic Checklist Completed: patient identified, site marked, surgical consent, pre-op evaluation, timeout performed, IV checked, risks and benefits discussed and monitors and equipment checked Spinal Block Patient position: sitting Prep: site prepped and draped and DuraPrep Patient monitoring: heart rate, cardiac monitor, continuous pulse ox and blood pressure Approach: midline Location: L3-4 Injection technique: single-shot Needle Needle type: Sprotte  Needle gauge: 24 G Needle length: 9 cm Needle insertion depth: 7 cm Assessment Sensory level: T3 Additional Notes Patient tolerated procedure well. Adequate sensory level.

## 2014-04-30 NOTE — Brief Op Note (Signed)
04/30/2014  12:35 PM  PATIENT:  [name]  28 y.o. female  PRE-OPERATIVE DIAGNOSIS:  REPEAT x 3  POST-OPERATIVE DIAGNOSIS:  same  PROCEDURE:  Procedure(s): REPEAT CESAREAN SECTION (N/A)  SURGEON:  Surgeon(s) and Role:    * Allyn Kenner, DO - Primary    * W Delene Loll, MD - Assisting  ANESTHESIA:   spinal  EBL:  Total I/O In: 2000 [I.V.:2000] Out: 1300 [Urine:100; Blood:1200]  SPECIMEN:  Source of Specimen:  cord blood  DISPOSITION OF SPECIMEN:  N/A  COUNTS:  YES  PLAN OF CARE: Admit to inpatient   PATIENT DISPOSITION:  PACU - hemodynamically stable.   FINDINGS: female infant, cephalic presentation, clear fluid, normal tubes and ovaries bilaterally, abundant scar tissue, anterior uterus to parietal peritoneum, involving bladder.

## 2014-04-30 NOTE — Addendum Note (Signed)
Addendum created 04/30/14 1531 by Ignacia Bayley, CRNA   Modules edited: Notes Section   Notes Section:  File: [id number]

## 2014-04-30 NOTE — Anesthesia Postprocedure Evaluation (Signed)
Anesthesia Post Note  Patient: [name]  Procedure(s) Performed: Procedure(s) (LRB): REPEAT CESAREAN SECTION (N/A)  Anesthesia type: Spinal  Patient location: PACU  Post pain: Pain level controlled  Post assessment: Post-op Vital signs reviewed  Last Vitals:  Filed Vitals:   04/30/14 1315  BP: 123/50  Pulse: 85  Temp:   Resp: 22    Post vital signs: Reviewed  Level of consciousness: awake  Complications: No apparent anesthesia complications

## 2014-04-30 NOTE — Anesthesia Postprocedure Evaluation (Signed)
  Anesthesia Post-op Note  Patient: [name]  Procedure(s) Performed: Procedure(s): REPEAT CESAREAN SECTION (N/A)  Patient Location: PACU  Anesthesia Type:Spinal  Level of Consciousness: awake, alert  and oriented  Airway and Oxygen Therapy: Patient Spontanous Breathing  Post-op Pain: none  Post-op Assessment: Post-op Vital signs reviewed, Patient's Cardiovascular Status Stable, Respiratory Function Stable, Patent Airway, No signs of Nausea or vomiting, Pain level controlled, No headache and No backache  Post-op Vital Signs: Reviewed and stable  Last Vitals:  Filed Vitals:   04/30/14 1315  BP: 123/50  Pulse: 85  Temp:   Resp: 22    Complications: No apparent anesthesia complications

## 2014-04-30 NOTE — H&P (Addendum)
28 y.o. G3P2002 at 39.5 presents for repeat c/s x 3.   Past Medical History  Diagnosis Date  . Hx of migraines   . Gestational diabetes mellitus, antepartum    Past Surgical History  Procedure Laterality Date  . Cesarean section      History   Social History  . Marital Status: Married    Spouse Name: N/A    Number of Children: N/A  . Years of Education: N/A   Occupational History  . Not on file.   Social History Main Topics  . Smoking status: Never Smoker   . Smokeless tobacco: Not on file  . Alcohol Use: No  . Drug Use: No  . Sexual Activity: Yes    Birth Control/ Protection: Implant   Other Topics Concern  . Not on file   Social History Narrative  . No narrative on file    No current facility-administered medications on file prior to encounter.   Current Outpatient Prescriptions on File Prior to Encounter  Medication Sig Dispense Refill  . Prenatal Vit-Fe Fumarate-FA (PRENATAL MULTIVITAMIN) TABS tablet Take 1 tablet by mouth daily at 12 noon.        No Known Allergies  Prenatal care:  GDMA 1, well controlled  Lungs: clear to ascultation Cor:  RRR Abdomen:  Soft, gravid Ex:  no cords, erythema Pelvic: def to OR  Prenatal Transfer tool: Diabetic: GDMA 1 Genetic screening - performed, wnl Fetal US: wnl Additional  [place]: none Medications: none Testing: GBS neg  A:  Repeat cesarean section  All risks, benefits and alternatives d/w patient and she desires to proceed. Ancef 2g pree-op. SCDs to be placed in OR.  Other routine pre-op care.Allyn Kenner

## 2014-05-01 ENCOUNTER — Encounter (HOSPITAL_COMMUNITY): Payer: Self-pay | Admitting: Obstetrics and Gynecology

## 2014-05-01 LAB — CBC
HCT: 34.7 % — ABNORMAL LOW (ref 36.0–46.0)
Hemoglobin: 11.6 g/dL — ABNORMAL LOW (ref 12.0–15.0)
MCH: 26.3 pg (ref 26.0–34.0)
MCHC: 33.4 g/dL (ref 30.0–36.0)
MCV: 78.7 fL (ref 78.0–100.0)
Platelets: 126 10*3/uL — ABNORMAL LOW (ref 150–400)
RBC: 4.41 MIL/uL (ref 3.87–5.11)
RDW: 14.7 % (ref 11.5–15.5)
WBC: 11.5 10*3/uL — ABNORMAL HIGH (ref 4.0–10.5)

## 2014-05-01 LAB — BIRTH TISSUE RECOVERY COLLECTION (PLACENTA DONATION)

## 2014-05-01 NOTE — Progress Notes (Signed)
  Patient is eating, ambulating, voiding.  Pain control is good.  Filed Vitals:   04/30/14 2009 04/30/14 2211 04/30/14 2350 05/01/14 0405  BP: 121/70 130/65 112/53 128/51  Pulse: 106 96 93 96  Temp:  98.6 F (37 C)    TempSrc:  Oral    Resp:      Weight:      SpO2: 98% 96% 97% 96%    lungs:   clear to auscultation cor:    RRR Abdomen:  soft, appropriate tenderness, incisions intact and without erythema or exudate ex:    no cords   Lab Results  Component Value Date   WBC 11.5* 05/01/2014   HGB 11.6* 05/01/2014   HCT 34.7* 05/01/2014   MCV 78.7 05/01/2014   PLT 126* 05/01/2014    --/--/B POS, B POS (08/07 1120)/RI  A/P    Post operative day 1.  Routine post op and postpartum care.  Expect d/c routine.  Percocet for pain control.

## 2014-05-02 ENCOUNTER — Encounter (HOSPITAL_COMMUNITY): Payer: Self-pay | Admitting: *Deleted

## 2014-05-02 MED ORDER — OXYCODONE-ACETAMINOPHEN 5-325 MG PO TABS: 1.0000 | ORAL_TABLET | ORAL | Status: DC | PRN

## 2014-05-02 NOTE — Discharge Summary (Signed)
Obstetric Discharge Summary Reason for Admission: cesarean section Prenatal Procedures: ultrasound Intrapartum Procedures: cesarean: low cervical, transverse Postpartum Procedures: none Complications-Operative and Postpartum: none Hemoglobin  Date Value Ref Range Status  05/01/2014 11.6* 12.0 - 15.0 g/dL Final     HCT  Date Value Ref Range Status  05/01/2014 34.7* 36.0 - 46.0 % Final    Physical Exam:  General: alert and cooperative Lochia: appropriate Uterine Fundus: firm Incision: healing well, no significant drainage, no significant erythema DVT Evaluation: No evidence of DVT seen on physical exam.  Discharge Diagnoses: Term Pregnancy-delivered  Discharge Information: Date: 05/02/2014 Activity: pelvic rest Diet: routine Medications: PNV, Ibuprofen, Colace and Percocet Condition: stable Instructions: refer to practice specific booklet Discharge to: home Follow-up Information   Follow up with Chamille Werntz, DO In 2 weeks.   Specialty:  Obstetrics and Gynecology   Contact information:   [address] [place] [address] [phone]       Newborn Data: Live born female  Birth Weight: 9 lb 12.6 oz (4440 g) APGAR: 9, 9  Home with mother.  [name] 05/02/2014, 10:23 AM

## 2014-05-02 NOTE — Op Note (Signed)
[name], White NO.:  [id number]  MEDICAL RECORD NO.:  [id number]  LOCATION:  9103                          FACILITY:  [facility]  PHYSICIAN:  Allyn Kenner, DO    DATE OF BIRTH:  Feb 14, 1986  DATE OF PROCEDURE:  04/30/2014 DATE OF DISCHARGE:                              OPERATIVE REPORT   PREOPERATIVE DIAGNOSIS:  Repeat cesarean section x 3  POSTOPERATIVE DIAGNOSIS:  Repeat cesarean section x3  PROCEDURE:  Low-transverse cesarean section, lysis of adhesions  SURGEON:  Allyn Kenner, DO.  ASSISTANT:  Barbaraann Rondo, M.D.  ANESTHESIA:  Spinal.  IV FLUIDS:  2000 mL.  URINE OUTPUT:  100 mL.  ESTIMATED BLOOD LOSS:  1200 mL.  SPECIMENS:  Cord blood.  FINDINGS:  Female infant, cephalic presentation, clear fluid.  Normal tubes and ovaries bilaterally.  Evident scar tissue from anterior uterus to parietal peritoneum involving the bladder.  COMPLICATIONS:  None.  CONDITION:  Stable to PACU.  DESCRIPTION OF PROCEDURE:  The patient was taken to the operating room, where spinal anesthesia was administered and found to be adequate.  She was prepped and draped in the normal sterile fashion in dorsal supine position with a leftward tilt.  Pfannenstiel skin incision was made with a scalpel and carried down to the underlying layer of fascia with Bovie cautery.  Fascia was incised at the midline.  Rectus muscles were not well visualized.  Abundant scar tissue was present.  Hemostats were used to separate scar tissue and blunt dissection was used to dissect into the peritoneum.  Clearly the peritoneum was taken down with Bovie cautery and Metzenbaum scissors and the scar tissue including the fascia was extended laterally by manual traction.  The abdomen was manually surveyed and no scar tissue was noted at the fundus of the uterus, however, adhesions were present from the anterior uterus to the anterior abdominal wall.  These were taken down with Bovie  cautery. Vesicouterine peritoneum was involving abundant scar tissue as well. Alexis self retractor was placed and with good visualization of bladder, a low-transverse cesarean incision was created.  Allis clamps were used to elevate the inferior and superior aspect of the uterine incision and this was extended  by cephalic and caudal traction.  Amniotic sac emanated from this and was entered bluntly.  Infant's head was located, elevated, and delivered without difficulty followed by the remainder of the infant's body.  The cord was clamped and cut and infant was handed off to awaiting Neonatology.  Gentle traction was placed on the umbilical cord and external uterine massage was performed.  The umbilical cord did avulse somewhat and placental bleeding was noted temporarily, this was controlled with placement of a Kelly clamp and the remaining amniotic sac was removed.  The uterine cavity was cleared of all clots and debris and the uterine incision was reapproximated and closed with Vicryl in a running, locked fashion.  Second layer of horizontal Lembert imbrication was performed and Bovie cautery was used to control multiple small bleeders along the uterine surface where scar tissue had been present.  The incision was examined and found to be hemostatic.  Both ovaries and tubes were visualized  and found to be normal.  The peritoneum was involved with scar tissue to the fascia. Attempt at closure was performed of both the peritoneum and the uterine scar tissue.  Fascia was then reapproximated and closed with looped PDS in a running fashion.  Subcutaneous tissue was irrigated, dried, and minimal use of Bovie cautery was needed for control of any bleeding. Hemostasis was achieved.  Chromic was used to place 4 interrupted sutures to close the dead space.  Skin was then reapproximated and closed with staples.  The patient tolerated the procedure well.  Sponge, lap, and needle counts were  correct x2.  The patient was taken to recovery in stable condition.          ______________________________ Allyn Kenner, DO     /MEDQ  D:  05/01/2014  T:  05/02/2014  Job:  [id number]

## 2014-07-23 ENCOUNTER — Encounter (HOSPITAL_COMMUNITY): Payer: Self-pay | Admitting: *Deleted

## 2014-08-07 LAB — HM PAP SMEAR

## 2014-11-06 ENCOUNTER — Emergency Department (HOSPITAL_COMMUNITY): Payer: Managed Care, Other (non HMO)

## 2014-11-06 ENCOUNTER — Encounter (HOSPITAL_COMMUNITY): Payer: Self-pay

## 2014-11-06 ENCOUNTER — Emergency Department (HOSPITAL_COMMUNITY)
Admission: EM | Admit: 2014-11-06 | Discharge: 2014-11-06 | Disposition: A | Discharge status: Home / Self Care | Payer: Managed Care, Other (non HMO) | Attending: Emergency Medicine | Admitting: Emergency Medicine

## 2014-11-06 DIAGNOSIS — Z3202 Encounter for pregnancy test, result negative: Secondary | ICD-10-CM | POA: Diagnosis not present

## 2014-11-06 DIAGNOSIS — R1032 Left lower quadrant pain: Secondary | ICD-10-CM | POA: Insufficient documentation

## 2014-11-06 DIAGNOSIS — R1030 Lower abdominal pain, unspecified: Secondary | ICD-10-CM

## 2014-11-06 DIAGNOSIS — R109 Unspecified abdominal pain: Secondary | ICD-10-CM

## 2014-11-06 DIAGNOSIS — Z8632 Personal history of gestational diabetes: Secondary | ICD-10-CM | POA: Diagnosis not present

## 2014-11-06 DIAGNOSIS — R1031 Right lower quadrant pain: Secondary | ICD-10-CM | POA: Insufficient documentation

## 2014-11-06 DIAGNOSIS — Z8679 Personal history of other diseases of the circulatory system: Secondary | ICD-10-CM | POA: Diagnosis not present

## 2014-11-06 DIAGNOSIS — Z79899 Other long term (current) drug therapy: Secondary | ICD-10-CM | POA: Diagnosis not present

## 2014-11-06 DIAGNOSIS — Z9889 Other specified postprocedural states: Secondary | ICD-10-CM | POA: Diagnosis not present

## 2014-11-06 LAB — COMPREHENSIVE METABOLIC PANEL
ALT: 32 U/L (ref 0–35)
AST: 21 U/L (ref 0–37)
Albumin: 3.8 g/dL (ref 3.5–5.2)
Alkaline Phosphatase: 73 U/L (ref 39–117)
Anion gap: 6 (ref 5–15)
BUN: 13 mg/dL (ref 6–23)
CO2: 25 mmol/L (ref 19–32)
Calcium: 9.5 mg/dL (ref 8.4–10.5)
Chloride: 108 mmol/L (ref 96–112)
Creatinine, Ser: 0.57 mg/dL (ref 0.50–1.10)
GFR calc Af Amer: >90 mL/min (ref >90)
GFR calc non Af Amer: >90 mL/min (ref >90)
Glucose, Bld: 128 mg/dL — ABNORMAL HIGH (ref 70–99)
Potassium: 3.9 mmol/L (ref 3.5–5.1)
Sodium: 139 mmol/L (ref 135–145)
Total Bilirubin: 0.3 mg/dL (ref 0.3–1.2)
Total Protein: 7.5 g/dL (ref 6.0–8.3)

## 2014-11-06 LAB — URINALYSIS, ROUTINE W REFLEX MICROSCOPIC
Bilirubin Urine: NEGATIVE
Glucose, UA: NEGATIVE mg/dL
Hgb urine dipstick: NEGATIVE
Ketones, ur: NEGATIVE mg/dL
Leukocytes, UA: NEGATIVE
Nitrite: NEGATIVE
Protein, ur: NEGATIVE mg/dL
Specific Gravity, Urine: 1.023 (ref 1.005–1.030)
Urobilinogen, UA: 0.2 mg/dL (ref 0.0–1.0)
pH: 6 (ref 5.0–8.0)

## 2014-11-06 LAB — CBC
HCT: 42.7 % (ref 36.0–46.0)
Hemoglobin: 14 g/dL (ref 12.0–15.0)
MCH: 25.6 pg — ABNORMAL LOW (ref 26.0–34.0)
MCHC: 32.8 g/dL (ref 30.0–36.0)
MCV: 78.2 fL (ref 78.0–100.0)
Platelets: 269 10*3/uL (ref 150–400)
RBC: 5.46 MIL/uL — ABNORMAL HIGH (ref 3.87–5.11)
RDW: 12.5 % (ref 11.5–15.5)
WBC: 6.3 10*3/uL (ref 4.0–10.5)

## 2014-11-06 LAB — WET PREP, GENITAL
Clue Cells Wet Prep HPF POC: NONE SEEN
Trich, Wet Prep: NONE SEEN
Yeast Wet Prep HPF POC: NONE SEEN

## 2014-11-06 LAB — POC URINE PREG, ED: Preg Test, Ur: NEGATIVE

## 2014-11-06 LAB — LIPASE, BLOOD: Lipase: 28 U/L (ref 11–59)

## 2014-11-06 MED ORDER — OXYCODONE-ACETAMINOPHEN 5-325 MG PO TABS: 1.0000 | ORAL_TABLET | Freq: Four times a day (QID) | ORAL | Status: DC | PRN

## 2014-11-06 MED ORDER — IOHEXOL 300 MG/ML  SOLN
100.0000 mL | Freq: Once | INTRAMUSCULAR | Status: AC | PRN
  Administered 2014-11-06: 100 mL via INTRAVENOUS

## 2014-11-06 MED ORDER — HYDROMORPHONE HCL 1 MG/ML IJ SOLN
1.0000 mg | Freq: Once | INTRAMUSCULAR | Status: AC
  Administered 2014-11-06: 1 mg via INTRAVENOUS
  Filled 2014-11-06: qty 1

## 2014-11-06 MED ORDER — IOHEXOL 300 MG/ML  SOLN
50.0000 mL | Freq: Once | INTRAMUSCULAR | Status: AC | PRN
  Administered 2014-11-06: 50 mL via ORAL

## 2014-11-06 MED ORDER — OXYCODONE-ACETAMINOPHEN 5-325 MG PO TABS
2.0000 | ORAL_TABLET | Freq: Once | ORAL | Status: AC
  Administered 2014-11-06: 2 via ORAL
  Filled 2014-11-06: qty 2

## 2014-11-06 NOTE — ED Notes (Signed)
Pt states lower abdominal pain since 5:30 am.  Has not taken any meds for pain.  Nausea no vomiting.  No change in urination or bowels.  No vaginal discharge or odor

## 2014-11-06 NOTE — ED Provider Notes (Signed)
CSN: [id number]     Arrival date & time 11/06/14  4680 History   First MD Initiated Contact with Patient 11/06/14 [phone]     Chief Complaint  Patient presents with  . Abdominal Pain     (Consider location/radiation/quality/duration/timing/severity/associated sxs/prior Treatment) Patient is a 29 y.o. female presenting with abdominal pain. The history is provided by the patient.  Abdominal Pain Pain location:  Suprapubic, LLQ and RLQ Pain quality: tugging   Pain radiates to:  Does not radiate Pain severity:  Moderate Onset quality:  Sudden Timing:  Constant Progression:  Unchanged Chronicity:  New Context: awakening from sleep   Relieved by:  Nothing Worsened by:  Nothing tried Associated symptoms: no cough, no fever, no nausea, no shortness of breath and no vomiting   Risk factors: multiple surgeries (2 prior C-sections) and obesity     Past Medical History  Diagnosis Date  . Hx of migraines   . Gestational diabetes mellitus, antepartum    Past Surgical History  Procedure Laterality Date  . Cesarean section    . Cesarean section N/A 04/30/2014    Procedure: REPEAT CESAREAN SECTION;  Surgeon: Allyn Kenner, DO;  Location: [facility];  Service: Obstetrics;  Laterality: N/A;   Family History  Problem Relation Age of Onset  . Hypertension Other   . Hyperlipidemia Other   . Diabetes Other   . Obesity Other    History  Substance Use Topics  . Smoking status: Never Smoker   . Smokeless tobacco: Not on file  . Alcohol Use: No   OB History    Gravida Para Term Preterm AB TAB SAB Ectopic Multiple Living   4 3 1       1      Review of Systems  Constitutional: Negative for fever.  Respiratory: Negative for cough and shortness of breath.   Gastrointestinal: Positive for abdominal pain. Negative for nausea and vomiting.  All other systems reviewed and are negative.     Allergies  Review of patient's allergies indicates no known allergies.  Home Medications   Prior to  Admission medications   Medication Sig Start Date End Date Taking? Authorizing Provider  etonogestrel (NEXPLANON) 68 MG IMPL implant 1 each by Subdermal route once. 09/2014   Yes Historical Provider, MD  ibuprofen (ADVIL,MOTRIN) 200 MG tablet Take 800 mg by mouth every 4 (four) hours as needed for fever, headache, moderate pain or cramping.   Yes Historical Provider, MD  Prenatal Vit-Fe Fumarate-FA (PRENATAL MULTIVITAMIN) TABS tablet Take 1 tablet by mouth daily.    Yes Historical Provider, MD  oxyCODONE-acetaminophen (PERCOCET/ROXICET) 5-325 MG per tablet Take 1-2 tablets by mouth every 4 (four) hours as needed for severe pain (moderate - severe pain). Patient not taking: Reported on 11/06/2014 05/02/14   Allyn Kenner, DO   BP 125/58 mmHg  Pulse 88  Temp(Src) 98.5 F (36.9 C) (Oral)  Resp 18  SpO2 100%  LMP 09/05/2014 (Approximate) Physical Exam  Constitutional: She is oriented to person, place, and time. She appears well-developed and well-nourished. No distress.  HENT:  Head: Normocephalic and atraumatic.  Mouth/Throat: Oropharynx is clear and moist.  Eyes: EOM are normal. Pupils are equal, round, and reactive to light.  Neck: Normal range of motion. Neck supple.  Cardiovascular: Normal rate and regular rhythm.  Exam reveals no friction rub.   No murmur heard. Pulmonary/Chest: Effort normal and breath sounds normal. No respiratory distress. She has no wheezes. She has no rales.  Abdominal: Soft. She exhibits no distension.  There is no tenderness. There is no rebound.  Genitourinary: There is no rash, tenderness or lesion on the right labia. There is tenderness on the left labia. There is no rash or lesion on the left labia.  Musculoskeletal: Normal range of motion. She exhibits no edema.  Neurological: She is alert and oriented to person, place, and time.  Skin: Skin is warm and dry. No rash noted. She is not diaphoretic.  Nursing note and vitals reviewed.   ED Course   Procedures (including critical care time) Labs Review Labs Reviewed  WET PREP, GENITAL  CBC  COMPREHENSIVE METABOLIC PANEL  LIPASE, BLOOD  URINALYSIS, ROUTINE W REFLEX MICROSCOPIC  GC/CHLAMYDIA PROBE AMP ([place])    Imaging Review US Transvaginal Non-ob  11/06/2014   CLINICAL DATA:  Left adnexal pain.  EXAM: TRANSABDOMINAL AND TRANSVAGINAL ULTRASOUND OF PELVIS  DOPPLER ULTRASOUND OF OVARIES  TECHNIQUE: Both transabdominal and transvaginal ultrasound examinations of the pelvis were performed. Transabdominal technique was performed for global imaging of the pelvis including uterus, ovaries, adnexal regions, and pelvic cul-de-sac.  It was necessary to proceed with endovaginal exam following the transabdominal exam to visualize the uterus and ovaries. Color and duplex Doppler ultrasound was utilized to evaluate blood flow to the ovaries.  COMPARISON:  None.  FINDINGS: Uterus  Measurements: 8.1 x 4.0 x 5.3 cm. No fibroids or other mass visualized.  Endometrium  Thickness: 9.4 mm.  No focal abnormality visualized.  Right ovary  Measurements: 4.0 x 1.9 x 2.6 cm. Small 1 cm follicular cyst. No significant abnormality. Tiny adjacent ovarian punctate calcification with no associated mass. This is nonspecific.  Left ovary  Measurements: 4.3 x 2.1 x 2.5 cm. Normal appearance/no adnexal mass.  Pulsed Doppler evaluation of both ovaries demonstrates normal low-resistance arterial and venous waveforms.  Other findings  No free fluid.  IMPRESSION: No significant abnormality identified.   Electronically Signed   By: Marcello Moores  Register   On: 11/06/2014 09:53   US Pelvis Complete  11/06/2014   CLINICAL DATA:  Left adnexal pain.  EXAM: TRANSABDOMINAL AND TRANSVAGINAL ULTRASOUND OF PELVIS  DOPPLER ULTRASOUND OF OVARIES  TECHNIQUE: Both transabdominal and transvaginal ultrasound examinations of the pelvis were performed. Transabdominal technique was performed for global imaging of the pelvis including uterus, ovaries,  adnexal regions, and pelvic cul-de-sac.  It was necessary to proceed with endovaginal exam following the transabdominal exam to visualize the uterus and ovaries. Color and duplex Doppler ultrasound was utilized to evaluate blood flow to the ovaries.  COMPARISON:  None.  FINDINGS: Uterus  Measurements: 8.1 x 4.0 x 5.3 cm. No fibroids or other mass visualized.  Endometrium  Thickness: 9.4 mm.  No focal abnormality visualized.  Right ovary  Measurements: 4.0 x 1.9 x 2.6 cm. Small 1 cm follicular cyst. No significant abnormality. Tiny adjacent ovarian punctate calcification with no associated mass. This is nonspecific.  Left ovary  Measurements: 4.3 x 2.1 x 2.5 cm. Normal appearance/no adnexal mass.  Pulsed Doppler evaluation of both ovaries demonstrates normal low-resistance arterial and venous waveforms.  Other findings  No free fluid.  IMPRESSION: No significant abnormality identified.   Electronically Signed   By: Marcello Moores  Register   On: 11/06/2014 09:53   Ct Abdomen Pelvis W Contrast  11/06/2014   CLINICAL DATA:  Left lower quadrant pain since early this morning.  EXAM: CT ABDOMEN AND PELVIS WITH CONTRAST  TECHNIQUE: Multidetector CT imaging of the abdomen and pelvis was performed using the standard protocol following bolus administration of intravenous contrast.  CONTRAST:  33mL OMNIPAQUE IOHEXOL 300 MG/ML SOLN, 19mL OMNIPAQUE IOHEXOL 300 MG/ML SOLN  COMPARISON:  None.  FINDINGS: The liver, biliary tree, spleen, pancreas, adrenal glands, and kidneys are normal. The bowel is normal including the terminal ileum and appendix. Uterus and ovaries and bladder are normal. No adenopathy. No free air or free fluid. No acute osseous abnormality. Small central disc protrusion at L4-5. Slight degenerative changes of the sacroiliac joints, probably related to child birth.  There is stretching of the linea alba in the midline at the level of the umbilicus but there is no true defect in the anterior abdominal wall.   IMPRESSION: Benign appearing abdomen and pelvis. Small disc protrusion at L4-5 with the midline.   Electronically Signed   By: Lorriane Shire M.D.   On: 11/06/2014 11:53   [place] Art/ven Flow Abd Pelv Doppler  11/06/2014   CLINICAL DATA:  Left adnexal pain.  EXAM: TRANSABDOMINAL AND TRANSVAGINAL ULTRASOUND OF PELVIS  DOPPLER ULTRASOUND OF OVARIES  TECHNIQUE: Both transabdominal and transvaginal ultrasound examinations of the pelvis were performed. Transabdominal technique was performed for global imaging of the pelvis including uterus, ovaries, adnexal regions, and pelvic cul-de-sac.  It was necessary to proceed with endovaginal exam following the transabdominal exam to visualize the uterus and ovaries. Color and duplex Doppler ultrasound was utilized to evaluate blood flow to the ovaries.  COMPARISON:  None.  FINDINGS: Uterus  Measurements: 8.1 x 4.0 x 5.3 cm. No fibroids or other mass visualized.  Endometrium  Thickness: 9.4 mm.  No focal abnormality visualized.  Right ovary  Measurements: 4.0 x 1.9 x 2.6 cm. Small 1 cm follicular cyst. No significant abnormality. Tiny adjacent ovarian punctate calcification with no associated mass. This is nonspecific.  Left ovary  Measurements: 4.3 x 2.1 x 2.5 cm. Normal appearance/no adnexal mass.  Pulsed Doppler evaluation of both ovaries demonstrates normal low-resistance arterial and venous waveforms.  Other findings  No free fluid.  IMPRESSION: No significant abnormality identified.   Electronically Signed   By: Marcello Moores  Register   On: 11/06/2014 09:53     EKG Interpretation None      MDM   Final diagnoses:  Lower abdominal pain    71F here with abdominal pain. Awoke her from sleep. Described as pulling sensation in her lower abdomen. Does not radiate. No nausea, vomiting, diarrhea, constipation, hematuria, hematochezia, vaginal discharge, vaginal bleeding. Recently had a next will not put in last month. She is 6 months post a C-section. No fevers and vitals are  stable here. On exam is diffuse lower abdominal pain. We'll perform pelvic exam. Pelvic with mild L adnexal tenderness. Pelvic US normal. CT normal.  Patient given pain medicine. Instructed to f/u with pain medicine.      Evelina Bucy, MD 11/06/14 [phone]

## 2014-11-06 NOTE — Discharge Instructions (Signed)
Abdominal Pain, Women °Abdominal (stomach, pelvic, or belly) pain can be caused by many things. It is important to tell your doctor: °· The location of the pain. °· Does it come and go or is it present all the time? °· Are there things that start the pain (eating certain foods, exercise)? °· Are there other symptoms associated with the pain (fever, nausea, vomiting, diarrhea)? °All of this is helpful to know when trying to find the cause of the pain. °CAUSES  °· Stomach: virus or bacteria infection, or ulcer. °· Intestine: appendicitis (inflamed appendix), regional ileitis (Crohn's disease), ulcerative colitis (inflamed colon), irritable bowel syndrome, diverticulitis (inflamed diverticulum of the colon), or cancer of the stomach or intestine. °· Gallbladder disease or stones in the gallbladder. °· Kidney disease, kidney stones, or infection. °· Pancreas infection or cancer. °· Fibromyalgia (pain disorder). °· Diseases of the female organs: °¨ Uterus: fibroid (non-cancerous) tumors or infection. °¨ Fallopian tubes: infection or tubal pregnancy. °¨ Ovary: cysts or tumors. °¨ Pelvic adhesions (scar tissue). °¨ Endometriosis (uterus lining tissue growing in the pelvis and on the pelvic organs). °¨ Pelvic congestion syndrome (female organs filling up with blood just before the menstrual period). °¨ Pain with the menstrual period. °¨ Pain with ovulation (producing an egg). °¨ Pain with an IUD (intrauterine device, birth control) in the uterus. °¨ Cancer of the female organs. °· Functional pain (pain not caused by a disease, may improve without treatment). °· Psychological pain. °· Depression. °DIAGNOSIS  °Your doctor will decide the seriousness of your pain by doing an examination. °· Blood tests. °· X-rays. °· Ultrasound. °· CT scan (computed tomography, special type of X-ray). °· MRI (magnetic resonance imaging). °· Cultures, for infection. °· Barium enema (dye inserted in the large intestine, to better view it with  X-rays). °· Colonoscopy (looking in intestine with a lighted tube). °· Laparoscopy (minor surgery, looking in abdomen with a lighted tube). °· Major abdominal exploratory surgery (looking in abdomen with a large incision). °TREATMENT  °The treatment will depend on the cause of the pain.  °· Many cases can be observed and treated at home. °· Over-the-counter medicines recommended by your caregiver. °· Prescription medicine. °· Antibiotics, for infection. °· Birth control pills, for painful periods or for ovulation pain. °· Hormone treatment, for endometriosis. °· Nerve blocking injections. °· Physical therapy. °· Antidepressants. °· Counseling with a psychologist or psychiatrist. °· Minor or major surgery. °HOME CARE INSTRUCTIONS  °· Do not take laxatives, unless directed by your caregiver. °· Take over-the-counter pain medicine only if ordered by your caregiver. Do not take aspirin because it can cause an upset stomach or bleeding. °· Try a clear liquid diet (broth or water) as ordered by your caregiver. Slowly move to a bland diet, as tolerated, if the pain is related to the stomach or intestine. °· Have a thermometer and take your temperature several times a day, and record it. °· Bed rest and sleep, if it helps the pain. °· Avoid sexual intercourse, if it causes pain. °· Avoid stressful situations. °· Keep your follow-up appointments and tests, as your caregiver orders. °· If the pain does not go away with medicine or surgery, you may try: °¨ Acupuncture. °¨ Relaxation exercises (yoga, meditation). °¨ Group therapy. °¨ Counseling. °SEEK MEDICAL CARE IF:  °· You notice certain foods cause stomach pain. °· Your home care treatment is not helping your pain. °· You need stronger pain medicine. °· You want your IUD removed. °· You feel faint or   lightheaded.  You develop nausea and vomiting.  You develop a rash.  You are having side effects or an allergy to your medicine. SEEK IMMEDIATE MEDICAL CARE IF:   Your  pain does not go away or gets worse.  You have a fever.  Your pain is felt only in portions of the abdomen. The right side could possibly be appendicitis. The left lower portion of the abdomen could be colitis or diverticulitis.  You are passing blood in your stools (bright red or black tarry stools, with or without vomiting).  You have blood in your urine.  You develop chills, with or without a fever.  You pass out. MAKE SURE YOU:   Understand these instructions.  Will watch your condition.  Will get help right away if you are not doing well or get worse. Document Released: 07/05/2007 Document Revised: 01/22/2014 Document Reviewed: 07/25/2009 [facility] Patient Information 2015 [place], [place]. This information is not intended to replace advice given to you by your health care provider. Make sure you discuss any questions you have with your health care provider.   Emergency Department Resource Guide 1) Find a Doctor and Pay Out of Pocket Although you won't have to find out who is covered by your insurance plan, it is a good idea to ask around and get recommendations. You will then need to call the office and see if the doctor you have chosen will accept you as a new patient and what types of options they offer for patients who are self-pay. Some doctors offer discounts or will set up payment plans for their patients who do not have insurance, but you will need to ask so you aren't surprised when you get to your appointment.  2) Contact Your Local Health Department Not all health departments have doctors that can see patients for sick visits, but many do, so it is worth a call to see if yours does. If you don't know where your local health department is, you can check in your phone book. The CDC also has a tool to help you locate your state's health department, and many state websites also have listings of all of their local health departments.  3) Find a [facility] If your illness  is not likely to be very severe or complicated, you may want to try a walk in clinic. These are popping up all over the country in pharmacies, drugstores, and shopping centers. They're usually staffed by nurse practitioners or physician assistants that have been trained to treat common illnesses and complaints. They're usually fairly quick and inexpensive. However, if you have serious medical issues or chronic medical problems, these are probably not your best option.  No Primary Care Doctor: - Call Health Connect at  [phone] - they can help you locate a primary care doctor that  accepts your insurance, provides certain services, etc. - Physician Referral Service- [phone]  Chronic Pain Problems: Organization         Address  Phone   Notes  [facility]  [phone] Patients need to be referred by their primary care doctor.   Medication Assistance: Organization         Address  Phone   Notes  [facility] Medication [facility] [address] [place], [place] [address] [phone] --Must be a resident of [facility] -- Must have NO insurance coverage whatsoever (no Medicaid/ Medicare, etc.) -- The pt. MUST have a primary care doctor that directs their care  regularly and follows them in the community   MedAssist  [phone]   [employer]  [phone]    Agencies that provide inexpensive medical care: Organization         Address  Phone   Notes  [place]  [phone]   [facility]    [phone]   [facility] [place], [place] [address] [phone]   [place] [address] [place] [phone]   Planned Parenthood    [phone]   [facility]    [phone]   [place] and [place] [address], [place] Phone:  [phone], Fax:  [phone] Hours of Operation:  9 am - 6  pm, M-F.  Also accepts Medicaid/Medicare and self-pay.  [facility] for [place] [address], [address], [place] Phone: [phone], Fax: [phone]. Hours of Operation:  8:30 am - 5:30 pm, M-F.  Also accepts Medicaid and self-pay.  [facility] [address] [place] Phone: [phone]   [place], [place], [place] [phone], Ext. [phone] Mondays & Thursdays: 7-9 AM.  First 15 patients are seen on a first come, first serve basis.    [place] Providers:  Organization         Address  Phone   Notes  [facility] [address], [place] [phone] Also accepts self-pay patients.  [facility] [address], [place]  [phone]   [place], [address], [place] [phone]   [facility] [address] [place] [phone]   Lucianne Lei [address], [place]   [phone] Only accepts [place] Access [place] patients after they have their name applied to their card.   Self-Pay (no insurance) in [facility]:  Organization         Address  Phone   Notes  Sickle Cell Patients, [facility] [place] [phone]   [facility] [place] [phone]   [facility] [place]  [place], [place], [place] [phone]   [facility]/Dr. Osei-Bonsu  [address], [place] or [address], [place] [phone] Phone number for both [place] and [place] locations is the same.  [facility] and [facility] [address] [place] [phone]   [facility] [address] [place] or [address] [phone]   [facility] [address], [place] [phone], phone; [phone], fax Sees patients 1st and 3rd Saturday of every month.  Must not qualify for public or private insurance (i.e. Medicaid, Medicare, [place] Health Choice, Veterans' Benefits)  Household income should be no more than 200% of the poverty level The clinic cannot treat you if you are pregnant or think you are pregnant  Sexually transmitted diseases are not treated at the clinic.    Dental Care: Organization         Address  Phone  Notes  [facility] Department of [facility] [address] [place], [place] [phone] Accepts children up to age 37  who are enrolled in Medicaid or [place] Health Choice; pregnant women with a Medicaid card; and children who have applied for Medicaid or [place] Health Choice, but were declined, whose parents can pay a reduced fee at time of service.  [facility] Department of [facility]  [address], [place] [phone] Accepts children up to age 50 who are enrolled in [place] or [place]; pregnant women with a Medicaid card; and children who have applied for Medicaid or [place] Health Choice, but were declined, whose parents can pay a reduced fee at time of service.  [place] Adult Dental Access PROGRAM  [place] [phone] Patients are seen by appointment only. Walk-ins are not accepted. [place] will see patients 34 years of age and older. Monday - Tuesday (8am-5pm) Most Wednesdays (8:30-5pm) $30 per visit, cash only  [facility] Adult Dental Access PROGRAM  [address], [facility] [phone] Patients are seen by appointment only. Walk-ins are not accepted. [place] will see patients 55 years of age and older. One Wednesday Evening (Monthly: Volunteer Based).  $30 per visit, cash only  [place]  [phone] for adults; Children under age 55, call Graduate Pediatric Dentistry at [phone]. Children aged 46-14, please call [phone] to request a pediatric application.  Dental services are provided in all areas of dental care including fillings, crowns and bridges, complete and partial dentures, implants, gum treatment, root canals, and extractions. Preventive care is also provided. Treatment is provided to both adults and children. Patients are selected via a lottery and there is often a waiting list.   [facility] [address] [place]  [phone] [email or web address]   [facility] [address] [place], [place] [phone], Ext. [phone] Second and Fourth Thursday of each month, opens at 6:30 AM; Clinic ends at 9 AM.  Patients are seen on a first-come first-served basis, and a limited number are seen during each clinic.   [facility]  [address] [place] Danker [place], [place] [phone]   Eligibility Requirements You must have lived in [place], [place], or [place] counties for at least the last three months.   You cannot be eligible for state or federal sponsored [employer], including [employer], [place], or [employer].   You generally cannot be eligible for healthcare insurance through your employer.    How to apply: Eligibility screenings are held every Tuesday and Wednesday afternoon from 1:00 pm until 4:00 pm. You do not need an appointment for the interview!  [facility] [address], [place], [place]   [place]  [place] Department  [place]  [phone]    Behavioral Health Resources in the Community: Intensive Outpatient Programs Organization         Address  Phone  Notes  [place]. [address] [place], [place] [phone]   [facility] Outpatient [address], [place], [place]   ADS: Alcohol & Drug Svcs  [address], [place], [place]   [place] [address],  [place], [place] or [phone]   Substance Abuse Resources Organization         Address  Phone  Notes  Al[facility] Drug Services  [phone]   [facility]  [place]   Chinita Pester  [phone]   Residential & Outpatient Substance Abuse Program  [phone]   Psychological Services Organization         Address  Phone  Notes  [place]  [place]  [phone]   [place] [address] [place] or [phone]    Mobile Crisis Teams Organization         Address  Phone  Notes  [facility], Mobile Crisis Care Unit  [phone]   [facility]  [address] [place], [place]   [place] [address], [place] [phone]    Self-Help/Support Groups Organization         Address  Phone             Notes  [place] of [place] - variety of support groups  [place] Call for more information  Narcotics Anonymous (NA), [facility] [address], [employer] [place]  2 meetings at this location   Special educational needs teacher         Address  Phone  Notes  [facility] [place],    [place]  [phone]   [facility]  [address] [place] [phone], [place], [place]   [place], [place] [phone] Admissions: 8am-3pm M-F  Incentives Substance [place] [address]    [place], [place] [phone]   [facility] [address] [place], [place], [place]   [facility] [address],  [place], [place]   [facility] - Intensive Outpatient [address], [place], [place]   [facility] ([place] [address].,  [place], [place] [phone] or [phone]   [facility] (RTS) [address], [place], [place] Accepts Medicaid  [place] [address],  [place] [phone] Substance Abuse/Addiction Treatment   [facility] Organization         Address  Phone  Notes  [facility]  [phone]   Domenic Schwab, PhD [address] [place], [place]   [phone] or [phone]   [place], [place] [phone]   [facility] [address], [place], [place] [phone] Insurance/Medicaid/sponsorship through [facility] and Families [address]                                    [place], [place] [phone] [place] [address], [place] [phone]    Dr. Adele Schilder  [phone]   [facility] Dept. 1) [address], [place] 2) [place] 3)  [place] [address], [place] [phone]  [phone]   [place] [phone] or [phone] (After Hours)

## 2014-11-07 LAB — GC/CHLAMYDIA PROBE AMP (~~LOC~~) NOT AT ARMC
Chlamydia: NEGATIVE
Neisseria Gonorrhea: NEGATIVE

## 2015-08-18 ENCOUNTER — Emergency Department (HOSPITAL_COMMUNITY)
Admission: EM | Admit: 2015-08-18 | Discharge: 2015-08-18 | Disposition: A | Discharge status: Home / Self Care | Payer: Managed Care, Other (non HMO) | Attending: Emergency Medicine | Admitting: Emergency Medicine

## 2015-08-18 ENCOUNTER — Encounter (HOSPITAL_COMMUNITY): Payer: Self-pay | Admitting: Nurse Practitioner

## 2015-08-18 DIAGNOSIS — Z8679 Personal history of other diseases of the circulatory system: Secondary | ICD-10-CM | POA: Diagnosis not present

## 2015-08-18 DIAGNOSIS — Z8632 Personal history of gestational diabetes: Secondary | ICD-10-CM | POA: Insufficient documentation

## 2015-08-18 DIAGNOSIS — R0981 Nasal congestion: Secondary | ICD-10-CM | POA: Diagnosis present

## 2015-08-18 DIAGNOSIS — Z79899 Other long term (current) drug therapy: Secondary | ICD-10-CM | POA: Diagnosis not present

## 2015-08-18 DIAGNOSIS — J069 Acute upper respiratory infection, unspecified: Secondary | ICD-10-CM | POA: Diagnosis not present

## 2015-08-18 MED ORDER — PSEUDOEPHEDRINE-GUAIFENESIN ER 60-600 MG PO TB12: 1.0000 | ORAL_TABLET | Freq: Two times a day (BID) | ORAL | Status: DC

## 2015-08-18 NOTE — Discharge Instructions (Signed)
Follow-up with your doctor as needed for reevaluation. Take your medications as prescribed. Return to ED for any new or worsened symptoms.  Upper Respiratory Infection, Adult Most upper respiratory infections (URIs) are a viral infection of the air passages leading to the lungs. A URI affects the nose, throat, and upper air passages. The most common type of URI is nasopharyngitis and is typically referred to as "the common cold." URIs run their course and usually go away on their own. Most of the time, a URI does not require medical attention, but sometimes a bacterial infection in the upper airways can follow a viral infection. This is called a secondary infection. Sinus and middle ear infections are common types of secondary upper respiratory infections. Bacterial pneumonia can also complicate a URI. A URI can worsen asthma and chronic obstructive pulmonary disease (COPD). Sometimes, these complications can require emergency medical care and may be life threatening.  CAUSES Almost all URIs are caused by viruses. A virus is a type of germ and can spread from one person to another.  RISKS FACTORS You may be at risk for a URI if:   You smoke.   You have chronic heart or lung disease.  You have a weakened defense (immune) system.   You are very young or very old.   You have nasal allergies or asthma.  You work in crowded or poorly ventilated areas.  You work in health care facilities or schools. SIGNS AND SYMPTOMS  Symptoms typically develop 2-3 days after you come in contact with a cold virus. Most viral URIs last 7-10 days. However, viral URIs from the influenza virus (flu virus) can last 14-18 days and are typically more severe. Symptoms may include:   Runny or stuffy (congested) nose.   Sneezing.   Cough.   Sore throat.   Headache.   Fatigue.   Fever.   Loss of appetite.   Pain in your forehead, behind your eyes, and over your cheekbones (sinus pain).  Muscle  aches.  DIAGNOSIS  Your health care provider may diagnose a URI by:  Physical exam.  Tests to check that your symptoms are not due to another condition such as:  Strep throat.  Sinusitis.  Pneumonia.  Asthma. TREATMENT  A URI goes away on its own with time. It cannot be cured with medicines, but medicines may be prescribed or recommended to relieve symptoms. Medicines may help:  Reduce your fever.  Reduce your cough.  Relieve nasal congestion. HOME CARE INSTRUCTIONS   Take medicines only as directed by your health care provider.   Gargle warm saltwater or take cough drops to comfort your throat as directed by your health care provider.  Use a warm mist humidifier or inhale steam from a shower to increase air moisture. This may make it easier to breathe.  Drink enough fluid to keep your urine clear or pale yellow.   Eat soups and other clear broths and maintain good nutrition.   Rest as needed.   Return to work when your temperature has returned to normal or as your health care provider advises. You may need to stay home longer to avoid infecting others. You can also use a face mask and careful hand washing to prevent spread of the virus.  Increase the usage of your inhaler if you have asthma.   Do not use any tobacco products, including cigarettes, chewing tobacco, or electronic cigarettes. If you need help quitting, ask your health care provider. PREVENTION  The best way  to protect yourself from getting a cold is to practice good hygiene.   Avoid oral or hand contact with people with cold symptoms.   Wash your hands often if contact occurs.  There is no clear evidence that vitamin C, vitamin E, echinacea, or exercise reduces the chance of developing a cold. However, it is always recommended to get plenty of rest, exercise, and practice good nutrition.  SEEK MEDICAL CARE IF:   You are getting worse rather than better.   Your symptoms are not controlled by  medicine.   You have chills.  You have worsening shortness of breath.  You have brown or red mucus.  You have yellow or brown nasal discharge.  You have pain in your face, especially when you bend forward.  You have a fever.  You have swollen neck glands.  You have pain while swallowing.  You have white areas in the back of your throat. SEEK IMMEDIATE MEDICAL CARE IF:   You have severe or persistent:  Headache.  Ear pain.  Sinus pain.  Chest pain.  You have chronic lung disease and any of the following:  Wheezing.  Prolonged cough.  Coughing up blood.  A change in your usual mucus.  You have a stiff neck.  You have changes in your:  Vision.  Hearing.  Thinking.  Mood. MAKE SURE YOU:   Understand these instructions.  Will watch your condition.  Will get help right away if you are not doing well or get worse.   This information is not intended to replace advice given to you by your health care provider. Make sure you discuss any questions you have with your health care provider.   Document Released: 03/03/2001 Document Revised: 01/22/2015 Document Reviewed: 12/13/2013 Elsevier Interactive Patient Education [employer].

## 2015-08-18 NOTE — ED Provider Notes (Signed)
CSN: [id number]     Arrival date & time 08/18/15  1136 History  By signing my name below, I, Starleen Arms, attest that this documentation has been prepared under the direction and in the presence of [employer], PA-C. Electronically Signed: Starleen Arms ED Scribe. 08/18/2015. 11:57 AM.    Chief Complaint  Patient presents with  . URI   The history is provided by the patient. No language interpreter was used.   HPI Comments: [name] is a 29 y.o. female with no chronic conditions who presents to the Emergency Department complaining of worsening nasal congestion onset 2 days ago with associated sore throat, an intermittently productive cough, and chest discomfort with cough.  The patient's young child was seen in the ED today for similar symptoms and dx'd with pneumonia.  At that time, it was suggested the patient check-in to the ED and be evaluated  Patient has taken Mucinex with some improvement as well as Airborne.  She denies recent travel, surgery.  She denies hx of DVT/PE.  She denies SOB, fever.  NKA.   Past Medical History  Diagnosis Date  . Hx of migraines   . Gestational diabetes mellitus, antepartum    Past Surgical History  Procedure Laterality Date  . Cesarean section    . Cesarean section N/A 04/30/2014    Procedure: REPEAT CESAREAN SECTION;  Surgeon: Allyn Kenner, DO;  Location: [facility];  Service: Obstetrics;  Laterality: N/A;   Family History  Problem Relation Age of Onset  . Hypertension Other   . Hyperlipidemia Other   . Diabetes Other   . Obesity Other    Social History  Substance Use Topics  . Smoking status: Never Smoker   . Smokeless tobacco: None  . Alcohol Use: No   OB History    Gravida Para Term Preterm AB TAB SAB Ectopic Multiple Living   4 3 1       1      Review of Systems A complete 10 system review of systems was obtained and all systems are negative except as noted in the HPI and PMH.   Allergies  Review of patient's allergies  indicates no known allergies.  Home Medications   Prior to Admission medications   Medication Sig Start Date End Date Taking? Authorizing Provider  etonogestrel (NEXPLANON) 68 MG IMPL implant 1 each by Subdermal route once. 09/2014    Historical Provider, MD  ibuprofen (ADVIL,MOTRIN) 200 MG tablet Take 800 mg by mouth every 4 (four) hours as needed for fever, headache, moderate pain or cramping.    Historical Provider, MD  oxyCODONE-acetaminophen (PERCOCET/ROXICET) 5-325 MG per tablet Take 1-2 tablets by mouth every 4 (four) hours as needed for severe pain (moderate - severe pain). Patient not taking: Reported on 11/06/2014 05/02/14   Allyn Kenner, DO  oxyCODONE-acetaminophen (PERCOCET/ROXICET) 5-325 MG per tablet Take 1 tablet by mouth every 6 (six) hours as needed for severe pain. 11/06/14   Evelina Bucy, MD  Prenatal Vit-Fe Fumarate-FA (PRENATAL MULTIVITAMIN) TABS tablet Take 1 tablet by mouth daily.     Historical Provider, MD   BP 131/75 mmHg  Pulse 86  Temp(Src) 97.7 F (36.5 C) (Oral)  Resp 16  Ht 5\' 6"  (1.676 m)  Wt 247 lb 4.8 oz (112.175 kg)  BMI 39.93 kg/m2  SpO2 100%  LMP 08/15/2015 Physical Exam  Constitutional: She is oriented to person, place, and time. She appears well-developed and well-nourished. No distress.  HENT:  Head: Normocephalic and atraumatic.  Mouth/Throat: Oropharynx  is clear and moist.  Eyes: Conjunctivae and EOM are normal.  Neck: Neck supple. No tracheal deviation present.  No meningismus or nuchal rigidity.   Cardiovascular: Normal rate, regular rhythm and normal heart sounds.   Pulmonary/Chest: Effort normal. No respiratory distress.  Lungs CTA.  Abdominal: Soft. There is no tenderness.  Musculoskeletal: Normal range of motion. She exhibits no edema.  No peripheral edema.   Neurological: She is alert and oriented to person, place, and time.  Skin: Skin is warm and dry.  Psychiatric: She has a normal mood and affect. Her behavior is normal.   Nursing note and vitals reviewed.   ED Course  Procedures (including critical care time)  DIAGNOSTIC STUDIES: Oxygen Saturation is 100% on RA, normal by my interpretation.    COORDINATION OF CARE:  12:09 PM Discussed suspicion of viral etiology.  Will prescribe Mucinex D.  Patient should f/u with her PCP.  Patient acknowledges and agrees with plan.    Labs Review Labs Reviewed - No data to display  Imaging Review No results found. I have personally reviewed and evaluated these images and lab results as part of my medical decision-making.   EKG Interpretation None     Meds given in ED:  Medications - No data to display  New Prescriptions   PSEUDOEPHEDRINE-GUAIFENESIN (MUCINEX D) 60-600 MG 12 HR TABLET    Take 1 tablet by mouth every 12 (twelve) hours.   Filed Vitals:   08/18/15 1149  BP: 131/75  Pulse: 86  Temp: 97.7 F (36.5 C)  TempSrc: Oral  Resp: 16  Height: 5\' 6"  (1.676 m)  Weight: 112.175 kg  SpO2: 100%    MDM  [name] is a 29 y.o. female who presents for evaluation of URI symptoms. She was concerned because her daughter was just diagnosed with pneumonia one to get checked out. On arrival, patient is hemodynamically stable, normal vital signs and is afebrile. She has a benign cardiopulmonary exam and physical exam is otherwise unremarkable. Low suspicion for pneumonia, meningitis or other bacterial infection. Continue symptomatically care home with Tylenol and Motrin, we will discharge with Mucinex D. Follow-up with PCP as needed for reevaluation. Strict return precautions given. Patient verbalizes understanding and agrees with this plan. Overall, patient appears well, nontoxic, hemodynamically stable with normal vital signs is afebrile and is appropriate for discharge. Final diagnoses:  None    I personally performed the services described in this documentation, which was scribed in my presence. The recorded information has been reviewed and is  accurate.    Comer Locket, PA-C 08/18/15 [place], MD 08/24/15 [phone]

## 2015-08-18 NOTE — ED Notes (Signed)
She c/o 2 day history headaches, body aches, cough. Denies fevers. Her daughter was just dx with pneumonia and staff told her she should be checked.

## 2015-08-18 NOTE — ED Notes (Signed)
In to see pt with PA.

## 2015-10-21 ENCOUNTER — Other Ambulatory Visit: Payer: Self-pay | Admitting: Obstetrics and Gynecology

## 2015-10-23 DIAGNOSIS — E669 Obesity, unspecified: Secondary | ICD-10-CM | POA: Insufficient documentation

## 2015-10-23 DIAGNOSIS — E66812 Obesity, class 2: Secondary | ICD-10-CM | POA: Insufficient documentation

## 2015-10-23 DIAGNOSIS — Z6836 Body mass index (BMI) 36.0-36.9, adult: Secondary | ICD-10-CM | POA: Insufficient documentation

## 2015-11-14 LAB — OB RESULTS CONSOLE ANTIBODY SCREEN: Antibody Screen: NEGATIVE

## 2015-11-14 LAB — OB RESULTS CONSOLE ABO/RH: RH Type: POSITIVE

## 2015-11-14 LAB — OB RESULTS CONSOLE HIV ANTIBODY (ROUTINE TESTING): HIV: NONREACTIVE

## 2015-11-14 LAB — OB RESULTS CONSOLE GC/CHLAMYDIA
Chlamydia: NEGATIVE
Gonorrhea: NEGATIVE

## 2015-11-14 LAB — OB RESULTS CONSOLE HEPATITIS B SURFACE ANTIGEN: Hepatitis B Surface Ag: NEGATIVE

## 2015-11-14 LAB — OB RESULTS CONSOLE RPR: RPR: NONREACTIVE

## 2015-11-14 LAB — OB RESULTS CONSOLE RUBELLA ANTIBODY, IGM: Rubella: IMMUNE

## 2015-11-26 ENCOUNTER — Encounter (HOSPITAL_COMMUNITY): Payer: Self-pay | Admitting: *Deleted

## 2015-11-26 ENCOUNTER — Inpatient Hospital Stay (HOSPITAL_COMMUNITY)
Admission: AD | Admit: 2015-11-26 | Discharge: 2015-11-26 | Disposition: A | Discharge status: Home / Self Care | Payer: Managed Care, Other (non HMO) | Source: Ambulatory Visit | Attending: Obstetrics and Gynecology | Admitting: Obstetrics and Gynecology

## 2015-11-26 DIAGNOSIS — O26892 Other specified pregnancy related conditions, second trimester: Secondary | ICD-10-CM | POA: Diagnosis present

## 2015-11-26 DIAGNOSIS — R109 Unspecified abdominal pain: Secondary | ICD-10-CM | POA: Insufficient documentation

## 2015-11-26 DIAGNOSIS — N949 Unspecified condition associated with female genital organs and menstrual cycle: Secondary | ICD-10-CM | POA: Diagnosis not present

## 2015-11-26 DIAGNOSIS — O9989 Other specified diseases and conditions complicating pregnancy, childbirth and the puerperium: Secondary | ICD-10-CM | POA: Diagnosis not present

## 2015-11-26 DIAGNOSIS — Z3A14 14 weeks gestation of pregnancy: Secondary | ICD-10-CM | POA: Diagnosis not present

## 2015-11-26 LAB — URINALYSIS, ROUTINE W REFLEX MICROSCOPIC
Bilirubin Urine: NEGATIVE
Glucose, UA: NEGATIVE mg/dL
Hgb urine dipstick: NEGATIVE
Ketones, ur: NEGATIVE mg/dL
Leukocytes, UA: NEGATIVE
Nitrite: NEGATIVE
Protein, ur: NEGATIVE mg/dL
Specific Gravity, Urine: 1.02 (ref 1.005–1.030)
pH: 6 (ref 5.0–8.0)

## 2015-11-26 LAB — WET PREP, GENITAL
Sperm: NONE SEEN
Trich, Wet Prep: NONE SEEN
Yeast Wet Prep HPF POC: NONE SEEN

## 2015-11-26 NOTE — MAU Note (Signed)
Pt reports cramping in right lower abd that radiates down in her groin since 2am, denies bleeding.

## 2015-11-26 NOTE — Discharge Instructions (Signed)

## 2015-11-26 NOTE — MAU Provider Note (Signed)
History     CSN: [id number]  Arrival date and time: 11/26/15 2000   First Provider Initiated Contact with Patient 11/26/15 2024      No chief complaint on file.  HPI Ms. [name] is a 30 y.o. G4P1003 at [redacted]w[redacted]d who presents to MAU today with complaint of lower abdominal cramping. The patient states pain started this morning around 0200. She has not taken anything for pain. She rates pain at 6/10 now. She denies vaginal bleeding, discharge, UTI symptoms, N/V/D or constipation or fever.    OB History    Gravida Para Term Preterm AB TAB SAB Ectopic Multiple Living   4 3 1       3       Past Medical History  Diagnosis Date  . Hx of migraines   . Gestational diabetes mellitus, antepartum     Past Surgical History  Procedure Laterality Date  . Cesarean section    . Cesarean section N/A 04/30/2014    Procedure: REPEAT CESAREAN SECTION;  Surgeon: Allyn Kenner, DO;  Location: [facility];  Service: Obstetrics;  Laterality: N/A;    Family History  Problem Relation Age of Onset  . Hypertension Other   . Hyperlipidemia Other   . Diabetes Other   . Obesity Other     Social History  Substance Use Topics  . Smoking status: Never Smoker   . Smokeless tobacco: None  . Alcohol Use: No    Allergies: No Known Allergies  Prescriptions prior to admission  Medication Sig Dispense Refill Last Dose  . butalbital-acetaminophen-caffeine (FIORICET, ESGIC) 50-325-40 MG tablet Take 1 tablet by mouth every 4 (four) hours as needed for headache.   Past Week at Unknown time    Review of Systems  Constitutional: Negative for fever and malaise/fatigue.  Gastrointestinal: Positive for abdominal pain. Negative for nausea, vomiting, diarrhea and constipation.  Genitourinary: Negative for dysuria, urgency and frequency.       Neg -vaginal bleeding, discharge   Physical Exam   Blood pressure 123/72, pulse 87, temperature 98.5 F (36.9 C), temperature source Oral, resp. rate 18, height 5\' 6"   (1.676 m), weight 250 lb (113.399 kg), last menstrual period 08/15/2015, SpO2 100 %, unknown if currently breastfeeding.  Physical Exam  Nursing note and vitals reviewed. Constitutional: She is oriented to person, place, and time. She appears well-developed and well-nourished. No distress.  HENT:  Head: Normocephalic and atraumatic.  Cardiovascular: Normal rate.   Respiratory: Effort normal.  GI: Soft. She exhibits no distension and no mass. There is tenderness (mild lower abdominal tenderness to palpation). There is no rebound and no guarding.  Genitourinary: Uterus is enlarged. Uterus is not tender. Cervix exhibits no motion tenderness, no discharge and no friability. No bleeding in the vagina. Vaginal discharge (small amount of thin, white discharge without odor) found.  Neurological: She is alert and oriented to person, place, and time.  Skin: Skin is warm and dry. No erythema.  Psychiatric: She has a normal mood and affect.    Results for orders placed or performed during the hospital encounter of 11/26/15 (from the past 24 hour(s))  Urinalysis, Routine w reflex microscopic (not at [facility])     Status: None   Collection Time: 11/26/15  8:06 PM  Result Value Ref Range   Color, Urine YELLOW YELLOW   APPearance CLEAR CLEAR   Specific Gravity, Urine 1.020 1.005 - 1.030   pH 6.0 5.0 - 8.0   Glucose, UA NEGATIVE NEGATIVE mg/dL   Hgb urine dipstick  NEGATIVE NEGATIVE   Bilirubin Urine NEGATIVE NEGATIVE   Ketones, ur NEGATIVE NEGATIVE mg/dL   Protein, ur NEGATIVE NEGATIVE mg/dL   Nitrite NEGATIVE NEGATIVE   Leukocytes, UA NEGATIVE NEGATIVE  Wet prep, genital     Status: Abnormal   Collection Time: 11/26/15  8:32 PM  Result Value Ref Range   Yeast Wet Prep HPF POC NONE SEEN NONE SEEN   Trich, Wet Prep NONE SEEN NONE SEEN   Clue Cells Wet Prep HPF POC PRESENT (A) NONE SEEN   WBC, Wet Prep HPF POC MODERATE (A) NONE SEEN   Sperm NONE SEEN     MAU Course  Procedures None  MDM FHR -  160 bpm with doppler Declines pain medication Discussed patient with Dr. Rogue Bussing. Agrees with plan for discharge at this time. Will wait to treat BV at this time as patient is asymptomatic.   Assessment and Plan  A: SIUP at [redacted]w[redacted]d Abdominal pain in pregnancy  P: Discharge home Second trimester precautions discussed Patient advised to try abdominal binder, warm bath/shower and Tylenol PRN for pain Patient advised to follow-[facility] with [facility] as scheduled for routine prenatal care Patient may return to MAU as needed or if her condition were to change or worsen   Luvenia Redden, PA-C  11/26/2015, 8:45 PM

## 2015-11-26 NOTE — MAU Note (Addendum)
PT SAYS SHE STARTED  CRAMPING THAT COMES/ GOES   AT 0200   TODAY. -  NO MEDS.-      DID NOT  CALL DR    .  LAST VISIT  AT OFFICE  11-14-2015-   ALL OK.     NO VAG  BLEEDING.   LAST SEX-   FEB.         CRAMPS  ARE  [place] / GOES.

## 2016-03-11 ENCOUNTER — Encounter: Payer: Managed Care, Other (non HMO) | Attending: Obstetrics and Gynecology | Admitting: Skilled Nursing Facility1

## 2016-03-11 VITALS — Ht 66.0 in | Wt 257.0 lb

## 2016-03-11 DIAGNOSIS — O24410 Gestational diabetes mellitus in pregnancy, diet controlled: Secondary | ICD-10-CM

## 2016-03-11 DIAGNOSIS — O99810 Abnormal glucose complicating pregnancy: Secondary | ICD-10-CM | POA: Diagnosis not present

## 2016-03-12 ENCOUNTER — Encounter: Payer: Self-pay | Admitting: Skilled Nursing Facility1

## 2016-03-12 NOTE — Progress Notes (Signed)
  Patient was seen on 03/11/2016 for Gestational Diabetes self-management class at the [facility]. The following learning objectives were met by the patient during this course:   States the definition of Gestational Diabetes  States why dietary management is important in controlling blood glucose  Describes the effects each nutrient has on blood glucose levels  Demonstrates ability to create a balanced meal plan  Demonstrates carbohydrate counting   States when to check blood glucose levels involving a total of 4 separate occurences in a day  Demonstrates proper blood glucose monitoring techniques  States the effect of stress and exercise on blood glucose levels  States the importance of limiting caffeine and abstaining from alcohol and smoking  Demonstrates the knowledge the glucometer provided in class may not be covered by their insurance and to call their insurance provider immediately after class to know which glucometer their insurance provider does cover as well as calling their physician the next day for a prescription to the glucometer their insurance does cover (if the one provided is not) as well as the lancets and strips for that meter.  Blood glucose monitor given: Pt has her own meter already: reading 91  Patient instructed to monitor glucose levels: FBS: 60 - <90 1 hour: <140 2 hour: <120  *Patient received handouts:  Nutrition Diabetes and Pregnancy  Carbohydrate Counting List  Patient will be seen for follow-up as needed.

## 2016-04-22 ENCOUNTER — Other Ambulatory Visit: Payer: Self-pay | Admitting: Obstetrics and Gynecology

## 2016-04-22 LAB — OB RESULTS CONSOLE GBS: GBS: NEGATIVE

## 2016-05-07 ENCOUNTER — Encounter (HOSPITAL_COMMUNITY): Payer: Self-pay

## 2016-05-14 ENCOUNTER — Encounter (HOSPITAL_COMMUNITY)
Admission: RE | Admit: 2016-05-14 | Discharge: 2016-05-14 | Disposition: A | Discharge status: Home / Self Care | Payer: Managed Care, Other (non HMO) | Source: Ambulatory Visit | Attending: Obstetrics and Gynecology | Admitting: Obstetrics and Gynecology

## 2016-05-14 HISTORY — DX: Personal history of other infectious and parasitic diseases: Z86.19

## 2016-05-14 LAB — BASIC METABOLIC PANEL
Anion gap: 8 (ref 5–15)
BUN: <5 mg/dL — ABNORMAL LOW (ref 6–20)
CO2: 24 mmol/L (ref 22–32)
Calcium: 9.2 mg/dL (ref 8.9–10.3)
Chloride: 103 mmol/L (ref 101–111)
Creatinine, Ser: 0.54 mg/dL (ref 0.44–1.00)
GFR calc Af Amer: >60 mL/min (ref >60)
GFR calc non Af Amer: >60 mL/min (ref >60)
Glucose, Bld: 72 mg/dL (ref 65–99)
Potassium: 3.5 mmol/L (ref 3.5–5.1)
Sodium: 135 mmol/L (ref 135–145)

## 2016-05-14 LAB — CBC
HCT: 36.7 % (ref 36.0–46.0)
Hemoglobin: 12.5 g/dL (ref 12.0–15.0)
MCH: 26.2 pg (ref 26.0–34.0)
MCHC: 34.1 g/dL (ref 30.0–36.0)
MCV: 76.8 fL — ABNORMAL LOW (ref 78.0–100.0)
Platelets: 172 10*3/uL (ref 150–400)
RBC: 4.78 MIL/uL (ref 3.87–5.11)
RDW: 13.8 % (ref 11.5–15.5)
WBC: 7.7 10*3/uL (ref 4.0–10.5)

## 2016-05-14 LAB — TYPE AND SCREEN
ABO/RH(D): B POS
Antibody Screen: NEGATIVE

## 2016-05-14 NOTE — Patient Instructions (Signed)
[place]  05/14/2016   Your procedure is scheduled on:  05/15/2016   Enter through the Main Entrance of [facility] [place] up the phone at the desk and dial [phone].   Call this number if you have problems the morning of surgery: [phone]   Remember:   Do not eat food:After Midnight.  Do not drink clear liquids: After Midnight.  Take these medicines the morning of surgery with A SIP OF WATER: none   Do not wear jewelry, make-up or nail polish.  Do not wear lotions, powders, or perfumes. Do not wear deodorant.  Do not shave 48 hours prior to surgery.  Do not bring valuables to the hospital.  [facility] is not   responsible for any belongings or valuables brought to the hospital.  Contacts, dentures or bridgework may not be worn into surgery.  Leave suitcase in the car. After surgery it may be brought to your room.  For patients admitted to the hospital, checkout time is 11:00 AM the day of              discharge.   Patients discharged the day of surgery will not be allowed to drive             home.  Name and phone number of your driver: na  Special Instructions:   N/A   Please read over the following fact sheets that you were given:   Surgical Site Infection Prevention

## 2016-05-15 ENCOUNTER — Inpatient Hospital Stay (HOSPITAL_COMMUNITY): Payer: Managed Care, Other (non HMO) | Admitting: Anesthesiology

## 2016-05-15 ENCOUNTER — Encounter (HOSPITAL_COMMUNITY)
Admission: RE | Disposition: A | Discharge status: Home / Self Care | Payer: Self-pay | Source: Ambulatory Visit | Attending: Obstetrics and Gynecology

## 2016-05-15 ENCOUNTER — Inpatient Hospital Stay (HOSPITAL_COMMUNITY)
Admission: RE | Admit: 2016-05-15 | Discharge: 2016-05-17 | DRG: 765 | Disposition: A | Discharge status: Home / Self Care | Payer: Managed Care, Other (non HMO) | Source: Ambulatory Visit | Attending: Obstetrics and Gynecology | Admitting: Obstetrics and Gynecology

## 2016-05-15 ENCOUNTER — Encounter (HOSPITAL_COMMUNITY): Payer: Self-pay | Admitting: *Deleted

## 2016-05-15 DIAGNOSIS — Z3A39 39 weeks gestation of pregnancy: Secondary | ICD-10-CM | POA: Diagnosis not present

## 2016-05-15 DIAGNOSIS — O34211 Maternal care for low transverse scar from previous cesarean delivery: Secondary | ICD-10-CM | POA: Diagnosis present

## 2016-05-15 DIAGNOSIS — O24420 Gestational diabetes mellitus in childbirth, diet controlled: Secondary | ICD-10-CM | POA: Diagnosis present

## 2016-05-15 DIAGNOSIS — O99214 Obesity complicating childbirth: Secondary | ICD-10-CM | POA: Diagnosis present

## 2016-05-15 DIAGNOSIS — Z302 Encounter for sterilization: Secondary | ICD-10-CM

## 2016-05-15 DIAGNOSIS — Z6841 Body Mass Index (BMI) 40.0 and over, adult: Secondary | ICD-10-CM | POA: Diagnosis not present

## 2016-05-15 HISTORY — PX: TUBAL LIGATION: SHX77

## 2016-05-15 LAB — GLUCOSE, CAPILLARY
Glucose-Capillary: 90 mg/dL (ref 65–99)
Glucose-Capillary: 84 mg/dL (ref 65–99)

## 2016-05-15 LAB — RPR: RPR Ser Ql: NONREACTIVE

## 2016-05-15 SURGERY — Surgical Case
Anesthesia: Spinal

## 2016-05-15 MED ORDER — SCOPOLAMINE 1 MG/3DAYS TD PT72
MEDICATED_PATCH | TRANSDERMAL | Status: AC
  Administered 2016-05-15: 1.5 mg via TRANSDERMAL
  Filled 2016-05-15: qty 1

## 2016-05-15 MED ORDER — FENTANYL CITRATE (PF) 100 MCG/2ML IJ SOLN: 25.0000 ug | INTRAMUSCULAR | Status: DC | PRN

## 2016-05-15 MED ORDER — LACTATED RINGERS IV SOLN
Freq: Once | INTRAVENOUS | Status: AC
  Administered 2016-05-15 (×3): via INTRAVENOUS

## 2016-05-15 MED ORDER — SENNOSIDES-DOCUSATE SODIUM 8.6-50 MG PO TABS
2.0000 | ORAL_TABLET | ORAL | Status: DC
  Administered 2016-05-15 – 2016-05-17 (×2): 2 via ORAL
  Filled 2016-05-15 (×2): qty 2

## 2016-05-15 MED ORDER — SCOPOLAMINE 1 MG/3DAYS TD PT72
1.0000 | MEDICATED_PATCH | Freq: Once | TRANSDERMAL | Status: DC
  Administered 2016-05-15: 1.5 mg via TRANSDERMAL

## 2016-05-15 MED ORDER — WITCH HAZEL-GLYCERIN EX PADS: 1.0000 "application " | MEDICATED_PAD | CUTANEOUS | Status: DC | PRN

## 2016-05-15 MED ORDER — LACTATED RINGERS IV SOLN
INTRAVENOUS | Status: DC
  Administered 2016-05-15: 12:00:00 via INTRAVENOUS

## 2016-05-15 MED ORDER — METOCLOPRAMIDE HCL 5 MG/ML IJ SOLN
INTRAMUSCULAR | Status: DC | PRN
  Administered 2016-05-15: 10 mg via INTRAVENOUS

## 2016-05-15 MED ORDER — SIMETHICONE 80 MG PO CHEW
80.0000 mg | CHEWABLE_TABLET | ORAL | Status: DC
  Administered 2016-05-15 – 2016-05-17 (×2): 80 mg via ORAL
  Filled 2016-05-15 (×2): qty 1

## 2016-05-15 MED ORDER — ONDANSETRON HCL 4 MG/2ML IJ SOLN: 4.0000 mg | Freq: Three times a day (TID) | INTRAMUSCULAR | Status: DC | PRN

## 2016-05-15 MED ORDER — COCONUT OIL OIL: 1.0000 "application " | TOPICAL_OIL | Status: DC | PRN

## 2016-05-15 MED ORDER — LACTATED RINGERS IV BOLUS (SEPSIS)
500.0000 mL | Freq: Once | INTRAVENOUS | Status: AC
  Administered 2016-05-15: 22:00:00 via INTRAVENOUS

## 2016-05-15 MED ORDER — NALOXONE HCL 0.4 MG/ML IJ SOLN: 0.4000 mg | INTRAMUSCULAR | Status: DC | PRN

## 2016-05-15 MED ORDER — MORPHINE SULFATE-NACL 0.5-0.9 MG/ML-% IV SOSY
PREFILLED_SYRINGE | INTRAVENOUS | Status: AC
  Filled 2016-05-15: qty 1

## 2016-05-15 MED ORDER — TETANUS-DIPHTH-ACELL PERTUSSIS 5-2.5-18.5 LF-MCG/0.5 IM SUSP: 0.5000 mL | Freq: Once | INTRAMUSCULAR | Status: DC

## 2016-05-15 MED ORDER — NALBUPHINE HCL 10 MG/ML IJ SOLN: 5.0000 mg | INTRAMUSCULAR | Status: DC | PRN

## 2016-05-15 MED ORDER — FENTANYL CITRATE (PF) 100 MCG/2ML IJ SOLN
INTRAMUSCULAR | Status: DC | PRN
  Administered 2016-05-15: 15 ug via INTRATHECAL

## 2016-05-15 MED ORDER — DIPHENHYDRAMINE HCL 50 MG/ML IJ SOLN: 12.5000 mg | INTRAMUSCULAR | Status: DC | PRN

## 2016-05-15 MED ORDER — NALBUPHINE HCL 10 MG/ML IJ SOLN: 5.0000 mg | Freq: Once | INTRAMUSCULAR | Status: DC | PRN

## 2016-05-15 MED ORDER — OXYTOCIN 10 UNIT/ML IJ SOLN
INTRAVENOUS | Status: DC | PRN
  Administered 2016-05-15: 40 [IU] via INTRAVENOUS

## 2016-05-15 MED ORDER — KETOROLAC TROMETHAMINE 30 MG/ML IJ SOLN: 30.0000 mg | Freq: Four times a day (QID) | INTRAMUSCULAR | Status: DC | PRN

## 2016-05-15 MED ORDER — MORPHINE SULFATE (PF) 0.5 MG/ML IJ SOLN
INTRAMUSCULAR | Status: DC | PRN
Start: 2016-05-15 — End: 2016-05-15
  Administered 2016-05-15: .1 mg via INTRATHECAL

## 2016-05-15 MED ORDER — OXYTOCIN 10 UNIT/ML IJ SOLN
INTRAMUSCULAR | Status: AC
  Filled 2016-05-15: qty 4

## 2016-05-15 MED ORDER — KETOROLAC TROMETHAMINE 30 MG/ML IJ SOLN: 30.0000 mg | Freq: Four times a day (QID) | INTRAMUSCULAR | Status: AC | PRN

## 2016-05-15 MED ORDER — SIMETHICONE 80 MG PO CHEW
80.0000 mg | CHEWABLE_TABLET | Freq: Three times a day (TID) | ORAL | Status: DC
  Administered 2016-05-16 – 2016-05-17 (×3): 80 mg via ORAL
  Filled 2016-05-15 (×3): qty 1

## 2016-05-15 MED ORDER — MEPERIDINE HCL 25 MG/ML IJ SOLN
INTRAMUSCULAR | Status: DC | PRN
  Administered 2016-05-15 (×2): 12.5 mg via INTRAVENOUS

## 2016-05-15 MED ORDER — MENTHOL 3 MG MT LOZG: 1.0000 | LOZENGE | OROMUCOSAL | Status: DC | PRN

## 2016-05-15 MED ORDER — SODIUM CHLORIDE 0.9% FLUSH: 3.0000 mL | INTRAVENOUS | Status: DC | PRN

## 2016-05-15 MED ORDER — NALOXONE HCL 2 MG/2ML IJ SOSY
1.0000 ug/kg/h | PREFILLED_SYRINGE | INTRAVENOUS | Status: DC | PRN
  Filled 2016-05-15: qty 2

## 2016-05-15 MED ORDER — PRENATAL MULTIVITAMIN CH
1.0000 | ORAL_TABLET | Freq: Every day | ORAL | Status: DC
  Administered 2016-05-16 – 2016-05-17 (×2): 1 via ORAL
  Filled 2016-05-15 (×2): qty 1

## 2016-05-15 MED ORDER — BUPIVACAINE IN DEXTROSE 0.75-8.25 % IT SOLN
INTRATHECAL | Status: DC | PRN
  Administered 2016-05-15: 1.4 mL via INTRATHECAL

## 2016-05-15 MED ORDER — DIBUCAINE 1 % RE OINT: 1.0000 "application " | TOPICAL_OINTMENT | RECTAL | Status: DC | PRN

## 2016-05-15 MED ORDER — MEPERIDINE HCL 25 MG/ML IJ SOLN: 6.2500 mg | INTRAMUSCULAR | Status: DC | PRN

## 2016-05-15 MED ORDER — IBUPROFEN 600 MG PO TABS
600.0000 mg | ORAL_TABLET | Freq: Four times a day (QID) | ORAL | Status: DC
  Administered 2016-05-16 – 2016-05-17 (×7): 600 mg via ORAL
  Filled 2016-05-15 (×7): qty 1

## 2016-05-15 MED ORDER — FENTANYL CITRATE (PF) 100 MCG/2ML IJ SOLN
INTRAMUSCULAR | Status: AC
  Filled 2016-05-15: qty 2

## 2016-05-15 MED ORDER — DIPHENHYDRAMINE HCL 25 MG PO CAPS: 25.0000 mg | ORAL_CAPSULE | ORAL | Status: DC | PRN

## 2016-05-15 MED ORDER — MEPERIDINE HCL 25 MG/ML IJ SOLN
INTRAMUSCULAR | Status: AC
  Filled 2016-05-15: qty 1

## 2016-05-15 MED ORDER — DEXTROSE 5 % IV SOLN: 1.0000 ug/kg/h | INTRAVENOUS | Status: DC | PRN

## 2016-05-15 MED ORDER — KETOROLAC TROMETHAMINE 30 MG/ML IJ SOLN
30.0000 mg | Freq: Four times a day (QID) | INTRAMUSCULAR | Status: AC | PRN
  Administered 2016-05-15: 30 mg via INTRAMUSCULAR
  Filled 2016-05-15: qty 1

## 2016-05-15 MED ORDER — OXYCODONE-ACETAMINOPHEN 5-325 MG PO TABS
1.0000 | ORAL_TABLET | ORAL | Status: DC | PRN
  Administered 2016-05-16: 1 via ORAL
  Filled 2016-05-15: qty 1

## 2016-05-15 MED ORDER — LACTATED RINGERS IV SOLN
INTRAVENOUS | Status: DC
Start: 2016-05-15 — End: 2016-05-17
  Administered 2016-05-16: 01:00:00 via INTRAVENOUS

## 2016-05-15 MED ORDER — LACTATED RINGERS IV SOLN: INTRAVENOUS | Status: DC

## 2016-05-15 MED ORDER — OXYCODONE-ACETAMINOPHEN 5-325 MG PO TABS
2.0000 | ORAL_TABLET | ORAL | Status: DC | PRN
  Administered 2016-05-16 – 2016-05-17 (×7): 2 via ORAL
  Filled 2016-05-15 (×7): qty 2

## 2016-05-15 MED ORDER — DEXTROSE 5 % IV SOLN
3.0000 g | INTRAVENOUS | Status: AC
  Administered 2016-05-15: 3 g via INTRAVENOUS
  Filled 2016-05-15: qty 3000

## 2016-05-15 MED ORDER — PHENYLEPHRINE 8 MG IN D5W 100 ML (0.08MG/ML) PREMIX OPTIME
INJECTION | INTRAVENOUS | Status: DC | PRN
  Administered 2016-05-15: 60 ug/min via INTRAVENOUS

## 2016-05-15 MED ORDER — DIPHENHYDRAMINE HCL 25 MG PO CAPS: 25.0000 mg | ORAL_CAPSULE | Freq: Four times a day (QID) | ORAL | Status: DC | PRN

## 2016-05-15 MED ORDER — OXYTOCIN 40 UNITS IN LACTATED RINGERS INFUSION - SIMPLE MED: 2.5000 [IU]/h | INTRAVENOUS | Status: AC

## 2016-05-15 MED ORDER — DEXAMETHASONE SODIUM PHOSPHATE 4 MG/ML IJ SOLN
INTRAMUSCULAR | Status: AC
  Filled 2016-05-15: qty 1

## 2016-05-15 MED ORDER — METOCLOPRAMIDE HCL 5 MG/ML IJ SOLN: 10.0000 mg | Freq: Once | INTRAMUSCULAR | Status: DC | PRN

## 2016-05-15 MED ORDER — SODIUM CHLORIDE 0.9 % IR SOLN
Status: DC | PRN
Start: 2016-05-15 — End: 2016-05-15
  Administered 2016-05-15: 1

## 2016-05-15 MED ORDER — DIPHENHYDRAMINE HCL 25 MG PO CAPS
25.0000 mg | ORAL_CAPSULE | ORAL | Status: DC | PRN
  Filled 2016-05-15: qty 1

## 2016-05-15 MED ORDER — PHENYLEPHRINE 8 MG IN D5W 100 ML (0.08MG/ML) PREMIX OPTIME
INJECTION | INTRAVENOUS | Status: AC
  Filled 2016-05-15: qty 100

## 2016-05-15 MED ORDER — ZOLPIDEM TARTRATE 5 MG PO TABS: 5.0000 mg | ORAL_TABLET | Freq: Every evening | ORAL | Status: DC | PRN

## 2016-05-15 MED ORDER — ACETAMINOPHEN 325 MG PO TABS
650.0000 mg | ORAL_TABLET | ORAL | Status: DC | PRN
  Administered 2016-05-15: 650 mg via ORAL
  Filled 2016-05-15: qty 2

## 2016-05-15 MED ORDER — SIMETHICONE 80 MG PO CHEW
80.0000 mg | CHEWABLE_TABLET | ORAL | Status: DC | PRN
Start: 2016-05-15 — End: 2016-05-17

## 2016-05-15 MED ORDER — DEXAMETHASONE SODIUM PHOSPHATE 4 MG/ML IJ SOLN
INTRAMUSCULAR | Status: DC | PRN
  Administered 2016-05-15: 4 mg via INTRAVENOUS

## 2016-05-15 MED ORDER — ONDANSETRON HCL 4 MG/2ML IJ SOLN
INTRAMUSCULAR | Status: AC
  Filled 2016-05-15: qty 2

## 2016-05-15 MED ORDER — ONDANSETRON HCL 4 MG/2ML IJ SOLN
INTRAMUSCULAR | Status: DC | PRN
  Administered 2016-05-15: 4 mg via INTRAVENOUS

## 2016-05-15 SURGICAL SUPPLY — 34 items
BENZOIN TINCTURE PRP APPL 2/3 (GAUZE/BANDAGES/DRESSINGS) ×3 IMPLANT
CHLORAPREP W/TINT 26ML (MISCELLANEOUS) ×3 IMPLANT
CLAMP CORD UMBIL (MISCELLANEOUS) IMPLANT
CLIP FILSHIE TUBAL LIGA STRL (Clip) ×3 IMPLANT
CLOSURE STERI STRIP 1/2 X4 (GAUZE/BANDAGES/DRESSINGS) ×3 IMPLANT
CLOTH BEACON ORANGE TIMEOUT ST (SAFETY) ×3 IMPLANT
DRSG OPSITE POSTOP 4X10 (GAUZE/BANDAGES/DRESSINGS) ×3 IMPLANT
ELECT REM PT RETURN 9FT ADLT (ELECTROSURGICAL) ×3
ELECTRODE REM PT RTRN 9FT ADLT (ELECTROSURGICAL) ×2 IMPLANT
EXTRACTOR VACUUM M CUP 4 TUBE (SUCTIONS) IMPLANT
GLOVE BIOGEL PI IND STRL 6.5 (GLOVE) ×2 IMPLANT
GLOVE BIOGEL PI IND STRL 7.0 (GLOVE) ×2 IMPLANT
GLOVE BIOGEL PI INDICATOR 6.5 (GLOVE) ×1
GLOVE BIOGEL PI INDICATOR 7.0 (GLOVE) ×1
GLOVE ECLIPSE 6.5 STRL STRAW (GLOVE) ×3 IMPLANT
GOWN STRL REUS W/TWL LRG LVL3 (GOWN DISPOSABLE) ×6 IMPLANT
KIT ABG SYR 3ML LUER SLIP (SYRINGE) IMPLANT
NEEDLE HYPO 25X5/8 SAFETYGLIDE (NEEDLE) IMPLANT
NS IRRIG 1000ML POUR BTL (IV SOLUTION) ×3 IMPLANT
PACK C SECTION WH (CUSTOM PROCEDURE TRAY) ×3 IMPLANT
PAD ABD 7.5X8 STRL (GAUZE/BANDAGES/DRESSINGS) ×3 IMPLANT
PAD OB MATERNITY 4.3X12.25 (PERSONAL CARE ITEMS) ×3 IMPLANT
PENCIL SMOKE EVAC W/HOLSTER (ELECTROSURGICAL) ×3 IMPLANT
RTRCTR C-SECT PINK 25CM LRG (MISCELLANEOUS) ×3 IMPLANT
STAPLER VISISTAT 35W (STAPLE) ×3 IMPLANT
SUT MON AB 2-0 CT1 27 (SUTURE) ×3 IMPLANT
SUT MON AB 4-0 PS1 27 (SUTURE) IMPLANT
SUT PDS AB 0 CTX 60 (SUTURE) ×3 IMPLANT
SUT PLAIN 2 0 XLH (SUTURE) ×3 IMPLANT
SUT VIC AB 0 CTX 36 (SUTURE) ×2
SUT VIC AB 0 CTX36XBRD ANBCTRL (SUTURE) ×4 IMPLANT
SUT VIC AB 4-0 KS 27 (SUTURE) IMPLANT
TOWEL OR 17X24 6PK STRL BLUE (TOWEL DISPOSABLE) ×3 IMPLANT
TRAY FOLEY CATH SILVER 14FR (SET/KITS/TRAYS/PACK) ×3 IMPLANT

## 2016-05-15 NOTE — Anesthesia Postprocedure Evaluation (Signed)
Anesthesia Post Note  Patient: [name]  Procedure(s) Performed: Procedure(s) (LRB): CESAREAN SECTION (N/A) BILATERAL TUBAL LIGATION (Bilateral)  Patient location during evaluation: Mother Baby Anesthesia Type: Spinal Level of consciousness: awake and alert and oriented Pain management: satisfactory to patient Vital Signs Assessment: post-procedure vital signs reviewed and stable Respiratory status: spontaneous breathing and nonlabored ventilation Cardiovascular status: stable Postop Assessment: no headache, no backache, patient able to bend at knees, no signs of nausea or vomiting and adequate PO intake Anesthetic complications: no     Last Vitals:  Vitals:   05/15/16 1650 05/15/16 1750  BP: 129/66 122/64  Pulse: 81 77  Resp: 18 16  Temp: 37 C 37.2 C    Last Pain:  Vitals:   05/15/16 1750  TempSrc: Oral  PainSc: 2    Pain Goal: Patients Stated Pain Goal: 2 (05/15/16 1650)               Willa Rough

## 2016-05-15 NOTE — H&P (Signed)
30 y.o. [redacted]w[redacted]d  G4P1003 comes in for scheduled repeat c/s.  Otherwise has good fetal movement and no bleeding.  Past Medical History:  Diagnosis Date  . Gestational diabetes    diet controlled  . Gestational diabetes mellitus, antepartum   . Hx of migraines   . Hx of varicella     Past Surgical History:  Procedure Laterality Date  . CESAREAN SECTION    . CESAREAN SECTION N/A 04/30/2014   Procedure: REPEAT CESAREAN SECTION;  Surgeon: Allyn Kenner, DO;  Location: [facility];  Service: Obstetrics;  Laterality: N/A;    OB History  Gravida Para Term Preterm AB Living  4 3 1     3   SAB TAB Ectopic Multiple Live Births          1    # Outcome Date GA Lbr Len/2nd Weight Sex Delivery Anes PTL Lv  4 Current           3 Term 04/30/14 [redacted]w[redacted]d  4.44 kg (9 lb 12.6 oz) F CS-LTranv Spinal  LIV  2 Para 09/08/05     CS-LTranv     1 Para 08/12/02     CS-LTranv         Social History   Social History  . Marital status: Married    Spouse name: N/A  . Number of children: N/A  . Years of education: N/A   Occupational History  . Not on file.   Social History Main Topics  . Smoking status: Never Smoker  . Smokeless tobacco: Never Used  . Alcohol use No  . Drug use: No  . Sexual activity: Yes    Birth control/ protection: None   Other Topics Concern  . Not on file   Social History Narrative  . No narrative on file   Review of patient's allergies indicates no known allergies.    Prenatal Transfer Tool  Maternal Diabetes: Yes:  Diabetes Type:  Diet controlled Genetic Screening: Normal Maternal Ultrasounds/Referrals: Normal Fetal Ultrasounds or other Referrals:  None Maternal Substance Abuse:  No Significant Maternal Medications:  None Significant Maternal Lab Results: Lab values include: Group B Strep negative  Other PNC: GDMA1    There were no vitals filed for this visit.   Lungs/Cor:  NAD Abdomen:  soft, gravid Ex:  no cords, erythema    A/P   Admit for schedule R  c/s  GBS neg  3g Ancef  Other routine pre-op care ordered  Allyn Kenner

## 2016-05-15 NOTE — Anesthesia Procedure Notes (Signed)
Spinal  Patient location during procedure: OR Staffing Anesthesiologist: Montez Hageman Performed: anesthesiologist  Preanesthetic Checklist Completed: patient identified, site marked, surgical consent, pre-op evaluation, timeout performed, IV checked, risks and benefits discussed and monitors and equipment checked Spinal Block Patient position: sitting Prep: ChloraPrep Patient monitoring: heart rate, continuous pulse ox and blood pressure Approach: midline Location: L3-4 Injection technique: single-shot Needle Needle type: Sprotte  Needle gauge: 24 G Needle length: 9 cm Additional Notes Expiration date of kit checked and confirmed.  Attempt x1 by srna  Patient tolerated procedure well, without complications.

## 2016-05-15 NOTE — Brief Op Note (Signed)
05/15/2016  1:46 PM  PATIENT:  [name]  30 y.o. female  PRE-OPERATIVE DIAGNOSIS:  REPEAT with tubal ligation EDD: 05/21/16 NKDA  POST-OPERATIVE DIAGNOSIS:  REPEAT with tubal ligation  PROCEDURE:  Procedure(s): CESAREAN SECTION (N/A) BILATERAL TUBAL LIGATION (Bilateral)  SURGEON:  Surgeon(s) and Role: Panel 1:    * Allyn Kenner, DO - Primary  Panel 2:   Ileene Rubens, MD- assist  ANESTHESIA:   spinal  EBL:  Total I/O In: 3200 [I.V.:3200] Out: 1100 [Urine:200; Blood:900]  BLOOD ADMINISTERED:none  SPECIMEN:  Source of Specimen:  cord blood  DISPOSITION OF SPECIMEN:  N/A  COUNTS:  YES  TOURNIQUET:  * No tourniquets in log *  PLAN OF CARE: Admit to inpatient   PATIENT DISPOSITION:  PACU - hemodynamically stable.   Delay start of Pharmacological VTE agent (>24hrs) due to surgical blood loss or risk of bleeding: not applicable

## 2016-05-15 NOTE — Lactation Note (Signed)
This note was copied from a baby's chart. Lactation Consultation Note Initial visit at 36 hours of age.  Mom reports a good feeding a few hours ago with baby latched well on each breast for 30 minutes each.  Mom is holding baby STS while baby is asleep now.  Mom has attempted to latch and baby is sleepy.  Mom denies other concerns at this time.  Rn gave mom a LATCh score of 10 with last feeding. [facility] LC resources given and discussed.  Encouraged to feed with early cues on demand.  Early newborn behavior discussed.  Hand expression reported by mom with colostrum visible and 33mls of EBM given with syringe for first feeding in nursery.  Mom to call for assist as needed.     Patient Name: Girl [name] [id number] Date: 05/15/2016 Reason for consult: Initial assessment   Maternal Data Has patient been taught Hand Expression?: Yes Does the patient have breastfeeding experience prior to this delivery?: Yes  Feeding Feeding Type: Breast Fed Length of feed: 15 min  LATCH Score/Interventions Latch: Grasps breast easily, tongue down, lips flanged, rhythmical sucking.  Audible Swallowing: Spontaneous and intermittent  Type of Nipple: Everted at rest and after stimulation  Comfort (Breast/Nipple): Soft / non-tender     Hold (Positioning): No assistance needed to correctly position infant at breast.  LATCH Score: 10  Lactation Tools Discussed/Used WIC Program: No   Consult Status Consult Status: Follow-up Date: 05/16/16 Follow-up type: In-patient    Justice Britain 05/15/2016, 10:23 PM

## 2016-05-15 NOTE — Transfer of Care (Signed)
Immediate Anesthesia Transfer of Care Note  Patient: [name]  Procedure(s) Performed: Procedure(s): CESAREAN SECTION (N/A) BILATERAL TUBAL LIGATION (Bilateral)  Patient Location: PACU  Anesthesia Type:Spinal  Level of Consciousness: awake, alert  and oriented  Airway & Oxygen Therapy: Patient Spontanous Breathing  Post-op Assessment: Report given to RN and Post -op Vital signs reviewed and stable  Post vital signs: Reviewed and stable  Last Vitals:  Vitals:   05/15/16 1029  BP: 118/72  Pulse: 91  Resp: 18  Temp: 37 C    Last Pain:  Vitals:   05/15/16 1029  TempSrc: Oral      Patients Stated Pain Goal: 3 ([id number])  Complications: No apparent anesthesia complications

## 2016-05-15 NOTE — Anesthesia Preprocedure Evaluation (Signed)
Anesthesia Evaluation  Patient identified by MRN, date of birth, ID band Patient awake    Reviewed: Allergy & Precautions, NPO status , Patient's Chart, lab work & pertinent test results  Airway Mallampati: II  TM Distance: >3 FB Neck ROM: Full    Dental no notable dental hx.    Pulmonary neg pulmonary ROS,    Pulmonary exam normal breath sounds clear to auscultation       Cardiovascular negative cardio ROS Normal cardiovascular exam Rhythm:Regular Rate:Normal     Neuro/Psych negative neurological ROS  negative psych ROS   GI/Hepatic negative GI ROS, Neg liver ROS,   Endo/Other  diabetes, GestationalMorbid obesity  Renal/GU negative Renal ROS  negative genitourinary   Musculoskeletal negative musculoskeletal ROS (+)   Abdominal   Peds negative pediatric ROS (+)  Hematology negative hematology ROS (+)   Anesthesia Other Findings   Reproductive/Obstetrics negative OB ROS                             Anesthesia Physical Anesthesia Plan  ASA: II  Anesthesia Plan: Spinal   Post-op Pain Management:    Induction:   Airway Management Planned: Natural Airway  Additional Equipment:   Intra-op Plan:   Post-operative Plan:   Informed Consent: I have reviewed the patients History and Physical, chart, labs and discussed the procedure including the risks, benefits and alternatives for the proposed anesthesia with the patient or authorized representative who has indicated his/her understanding and acceptance.   Dental advisory given  Plan Discussed with: CRNA  Anesthesia Plan Comments:         Anesthesia Quick Evaluation

## 2016-05-16 LAB — CBC
HCT: 25.9 % — ABNORMAL LOW (ref 36.0–46.0)
Hemoglobin: 8.8 g/dL — ABNORMAL LOW (ref 12.0–15.0)
MCH: 26 pg (ref 26.0–34.0)
MCHC: 34 g/dL (ref 30.0–36.0)
MCV: 76.6 fL — ABNORMAL LOW (ref 78.0–100.0)
Platelets: 177 10*3/uL (ref 150–400)
RBC: 3.38 MIL/uL — ABNORMAL LOW (ref 3.87–5.11)
RDW: 13.9 % (ref 11.5–15.5)
WBC: 15.1 10*3/uL — ABNORMAL HIGH (ref 4.0–10.5)

## 2016-05-16 LAB — BIRTH TISSUE RECOVERY COLLECTION (PLACENTA DONATION)

## 2016-05-16 MED ORDER — BUTORPHANOL TARTRATE 1 MG/ML IJ SOLN
1.0000 mg | Freq: Once | INTRAMUSCULAR | Status: AC
  Administered 2016-05-16: 1 mg via INTRAVENOUS
  Filled 2016-05-16: qty 1

## 2016-05-16 MED ORDER — FERROUS SULFATE 325 (65 FE) MG PO TABS
325.0000 mg | ORAL_TABLET | Freq: Every day | ORAL | Status: DC
  Administered 2016-05-17: 325 mg via ORAL
  Filled 2016-05-16: qty 1

## 2016-05-16 NOTE — Progress Notes (Signed)
Called Dr. Philis Pique regarding mom's increased heart rate of 155 . Dr. Philis Pique ordered 500cc bolus of lactated ringers and said to call back if output did not increase.

## 2016-05-16 NOTE — Lactation Note (Signed)
This note was copied from a baby's chart. Lactation Consultation Note  Patient Name: Girl Areej Flook [id number] Date: 05/16/2016 Reason for consult: Follow-up assessment Baby at 29 hr of life. Mom denies breast or nipple pain. She is concerned because baby has been sleepy today. Discussed baby behavior, feeding frequency, baby belly size, voids, wt loss, breast changes, and nipple care. Demonstrated manual expression, colostrum noted bilaterally, spoon in room. Mom is aware of lactation services and support group. She will call as needed.      Maternal Data    Feeding Feeding Type: Breast Fed Length of feed: 5 min  LATCH Score/Interventions Latch: Grasps breast easily, tongue down, lips flanged, rhythmical sucking.  Audible Swallowing: Spontaneous and intermittent Intervention(s): Alternate breast massage;Hand expression;Skin to skin  Type of Nipple: Everted at rest and after stimulation  Comfort (Breast/Nipple): Soft / non-tender     Hold (Positioning): No assistance needed to correctly position infant at breast.  LATCH Score: 10  Lactation Tools Discussed/Used     Consult Status Consult Status: Follow-up Date: 05/17/16 Follow-up type: In-patient    Denzil Hughes 05/16/2016, 5:48 PM

## 2016-05-16 NOTE — Progress Notes (Signed)
Rn called Dr. Philis Pique regarding patient's right shoulder pain. An order was given to give 1 perocet early. In addition, if pain did not decrease to give stadol 1 mg in 1 to 2 hours.

## 2016-05-16 NOTE — Progress Notes (Addendum)
  Patient is eating, ambulating, voiding.  Pain control is good.  Vitals:   05/15/16 2136 05/15/16 2145 05/16/16 0040 05/16/16 0445  BP: 96/66  (!) 104/54 (!) 103/55  Pulse: (!) 155 (!) 121 98 89  Resp: 20 20 20 18   Temp:   98 F (36.7 C) 98.3 F (36.8 C)  TempSrc:   Oral Oral  SpO2:   99% 99%    lungs:   clear to auscultation cor:    RRR Abdomen:  soft, appropriate tenderness, incisions intact and without erythema or exudate ex:    no cords   Lab Results  Component Value Date   WBC 15.1 (H) 05/16/2016   HGB 8.8 (L) 05/16/2016   HCT 25.9 (L) 05/16/2016   MCV 76.6 (L) 05/16/2016   PLT 177 05/16/2016    --/--/B POS (08/24 1030)/RI  A/P    Post operative day 1.  Routine post op and postpartum care.  Expect d/c routine.  Percocet for pain control.  Pt had a episode of orthostatic changes last night- feeling better today and H/H stable although slightly lower at 8.8.  Anemia precautions and iron.

## 2016-05-16 NOTE — Discharge Summary (Signed)
Obstetric Discharge Summary Reason for Admission: cesarean section Prenatal Procedures: none Intrapartum Procedures: cesarean: low cervical, transverse and tubal ligation Postpartum Procedures: none Complications-Operative and Postpartum: none Hemoglobin  Date Value Ref Range Status  05/16/2016 8.8 (L) 12.0 - 15.0 g/dL Final    Comment:    DELTA CHECK NOTED REPEATED TO VERIFY    HCT  Date Value Ref Range Status  05/16/2016 25.9 (L) 36.0 - 46.0 % Final    Discharge Diagnoses: Term Pregnancy-delivered  Discharge Information: Date: 05/16/2016 Activity: pelvic rest Diet: routine Medications: PNV, Iron and Percocet Condition: stable Instructions: refer to practice specific booklet Discharge to: home Follow-up Information    CALLAHAN, SIDNEY, DO Follow up in 4 week(s).   Specialty:  Obstetrics and Gynecology Contact information: [address] [place] [address] [phone]           Newborn Data: Live born female  Birth Weight: 7 lb 3.3 oz (3270 g) APGAR: 8, 8  Home with mother.  Jaja Switalski A 05/16/2016, 9:23 AM

## 2016-05-17 MED ORDER — OXYCODONE-ACETAMINOPHEN 5-325 MG PO TABS: 1.0000 | ORAL_TABLET | ORAL | 0 refills | Status: DC | PRN

## 2016-05-17 NOTE — Progress Notes (Signed)
  Patient is eating, ambulating, voiding.  Pain control is good.  Vitals:   05/16/16 0040 05/16/16 0445 05/16/16 1712 05/17/16 0530  BP: (!) 104/54 (!) 103/55 116/60 122/63  Pulse: 98 89 (!) 111 85  Resp: 20 18 18 18   Temp: 98 F (36.7 C) 98.3 F (36.8 C) 98 F (36.7 C) 98.2 F (36.8 C)  TempSrc: Oral Oral Oral   SpO2: 99% 99%      lungs:   clear to auscultation cor:    RRR Abdomen:  soft, appropriate tenderness, incisions intact and without erythema or exudate ex:    no cords   Lab Results  Component Value Date   WBC 15.1 (H) 05/16/2016   HGB 8.8 (L) 05/16/2016   HCT 25.9 (L) 05/16/2016   MCV 76.6 (L) 05/16/2016   PLT 177 05/16/2016    --/--/B POS (08/24 1030)/RI  A/P    Post operative day 2.  Routine post op and postpartum care.  Expect d/c routine.  Percocet for pain control.

## 2016-05-25 ENCOUNTER — Emergency Department (HOSPITAL_COMMUNITY): Payer: Managed Care, Other (non HMO)

## 2016-05-25 ENCOUNTER — Emergency Department (HOSPITAL_COMMUNITY)
Admission: EM | Admit: 2016-05-25 | Discharge: 2016-05-25 | Disposition: A | Discharge status: Home / Self Care | Payer: Managed Care, Other (non HMO) | Attending: Emergency Medicine | Admitting: Emergency Medicine

## 2016-05-25 ENCOUNTER — Other Ambulatory Visit: Payer: Self-pay | Admitting: Physician Assistant

## 2016-05-25 ENCOUNTER — Encounter (HOSPITAL_COMMUNITY): Payer: Self-pay

## 2016-05-25 DIAGNOSIS — R0600 Dyspnea, unspecified: Secondary | ICD-10-CM | POA: Diagnosis not present

## 2016-05-25 DIAGNOSIS — R0602 Shortness of breath: Secondary | ICD-10-CM

## 2016-05-25 DIAGNOSIS — R600 Localized edema: Secondary | ICD-10-CM

## 2016-05-25 LAB — COMPREHENSIVE METABOLIC PANEL
ALT: 22 U/L (ref 14–54)
AST: 23 U/L (ref 15–41)
Albumin: 3.1 g/dL — ABNORMAL LOW (ref 3.5–5.0)
Alkaline Phosphatase: 91 U/L (ref 38–126)
Anion gap: 5 (ref 5–15)
BUN: 8 mg/dL (ref 6–20)
CO2: 24 mmol/L (ref 22–32)
Calcium: 8.7 mg/dL — ABNORMAL LOW (ref 8.9–10.3)
Chloride: 112 mmol/L — ABNORMAL HIGH (ref 101–111)
Creatinine, Ser: 0.69 mg/dL (ref 0.44–1.00)
GFR calc Af Amer: >60 mL/min (ref >60)
GFR calc non Af Amer: >60 mL/min (ref >60)
Glucose, Bld: 82 mg/dL (ref 65–99)
Potassium: 3.8 mmol/L (ref 3.5–5.1)
Sodium: 141 mmol/L (ref 135–145)
Total Bilirubin: 0.6 mg/dL (ref 0.3–1.2)
Total Protein: 5.6 g/dL — ABNORMAL LOW (ref 6.5–8.1)

## 2016-05-25 LAB — CBC WITH DIFFERENTIAL/PLATELET
Basophils Absolute: 0 10*3/uL (ref 0.0–0.1)
Basophils Relative: 0 %
Eosinophils Absolute: 0.2 10*3/uL (ref 0.0–0.7)
Eosinophils Relative: 2 %
HCT: 31.6 % — ABNORMAL LOW (ref 36.0–46.0)
Hemoglobin: 9.9 g/dL — ABNORMAL LOW (ref 12.0–15.0)
Lymphocytes Relative: 18 %
Lymphs Abs: 1.4 10*3/uL (ref 0.7–4.0)
MCH: 25.6 pg — ABNORMAL LOW (ref 26.0–34.0)
MCHC: 31.3 g/dL (ref 30.0–36.0)
MCV: 81.9 fL (ref 78.0–100.0)
Monocytes Absolute: 0.6 10*3/uL (ref 0.1–1.0)
Monocytes Relative: 8 %
Neutro Abs: 5.6 10*3/uL (ref 1.7–7.7)
Neutrophils Relative %: 72 %
Platelets: 237 10*3/uL (ref 150–400)
RBC: 3.86 MIL/uL — ABNORMAL LOW (ref 3.87–5.11)
RDW: 14.9 % (ref 11.5–15.5)
WBC: 7.8 10*3/uL (ref 4.0–10.5)

## 2016-05-25 LAB — I-STAT TROPONIN, ED: Troponin i, poc: 0 ng/mL (ref 0.00–0.08)

## 2016-05-25 LAB — PROTEIN / CREATININE RATIO, URINE
Creatinine, Urine: 36.25 mg/dL
Total Protein, Urine: <6 mg/dL

## 2016-05-25 LAB — BRAIN NATRIURETIC PEPTIDE
B Natriuretic Peptide: 201.2 pg/mL — ABNORMAL HIGH (ref 0.0–100.0)
B Natriuretic Peptide: 259.2 pg/mL — ABNORMAL HIGH (ref 0.0–100.0)

## 2016-05-25 LAB — MAGNESIUM: Magnesium: 1.9 mg/dL (ref 1.7–2.4)

## 2016-05-25 LAB — TSH: TSH: 1.12 u[IU]/mL (ref 0.350–4.500)

## 2016-05-25 MED ORDER — IPRATROPIUM-ALBUTEROL 0.5-2.5 (3) MG/3ML IN SOLN
3.0000 mL | Freq: Once | RESPIRATORY_TRACT | Status: AC
  Administered 2016-05-25: 3 mL via RESPIRATORY_TRACT
  Filled 2016-05-25: qty 3

## 2016-05-25 MED ORDER — HYDROCHLOROTHIAZIDE 25 MG PO TABS
25.0000 mg | ORAL_TABLET | Freq: Every day | ORAL | 0 refills | Status: DC
Start: 2016-05-25 — End: 2016-10-29

## 2016-05-25 MED ORDER — IOPAMIDOL (ISOVUE-370) INJECTION 76%
INTRAVENOUS | Status: AC
  Administered 2016-05-25: 100 mL
  Filled 2016-05-25: qty 100

## 2016-05-25 MED ORDER — NITROGLYCERIN 2 % TD OINT
1.0000 [in_us] | TOPICAL_OINTMENT | Freq: Once | TRANSDERMAL | Status: AC
  Administered 2016-05-25: 1 [in_us] via TOPICAL
  Filled 2016-05-25: qty 1

## 2016-05-25 NOTE — Consult Note (Signed)
Primary cardiologist: N/A Consulting cardiologist: Dr Carlyle Dolly MD Requesting physician: Dr Regenia Skeeter Indication: SOB  Clinical Summary [name] is a 30 y.o.female 10 days postpartum delivery via c-section with uncomplicated pregnancy. She has had 3 prior deliveries she reports without troubles other than episode of gestational diabetes. She presents with a 2 day history of increasing LE edema and orthopnea. She has not noticed significant DOE, however has been fairly sedentary. Highest level of activity was sweeping her kitchen which she tolerated without troubles.   BNP 259, Hgb 9.9, Plt 237, K 3.8, Cr 0.69, Mg 1.9,  CT PE: no acute PE, patchy infiltrates edema vs infection CXR: borderline cardiomegaly, mild pulm edema EKG: Sinus bradycardia   No Known Allergies  Medications Scheduled Medications:    Infusions:    PRN Medications:   Past Medical History:  Diagnosis Date  . Hx of migraines   . Hx of varicella     Past Surgical History:  Procedure Laterality Date  . CESAREAN SECTION    . CESAREAN SECTION N/A 04/30/2014   Procedure: REPEAT CESAREAN SECTION;  Surgeon: Allyn Kenner, DO;  Location: [facility];  Service: Obstetrics;  Laterality: N/A;  . CESAREAN SECTION N/A 05/15/2016   Procedure: CESAREAN SECTION;  Surgeon: Allyn Kenner, DO;  Location: [facility];  Service: Obstetrics;  Laterality: N/A;  . TUBAL LIGATION Bilateral 05/15/2016   Procedure: BILATERAL TUBAL LIGATION;  Surgeon: Allyn Kenner, DO;  Location: [facility];  Service: Obstetrics;  Laterality: Bilateral;    Family History  Problem Relation Age of Onset  . Diabetes Mother   . Hypertension Mother   . Hyperlipidemia Other   . Obesity Other   . Diabetes Maternal Aunt   . Hypertension Maternal Aunt   . Diabetes Paternal Aunt   . Diabetes Maternal Grandmother   . Diabetes Paternal Grandfather     Social History Ms. Lindblom reports that she has never smoked. She has never  used smokeless tobacco. Ms. Tufte reports that she does not drink alcohol.  Review of Systems CONSTITUTIONAL: No weight loss, fever, chills, weakness or fatigue.  HEENT: Eyes: No visual loss, blurred vision, double vision or yellow sclerae. No hearing loss, sneezing, congestion, runny nose or sore throat.  SKIN: No rash or itching.  CARDIOVASCULAR: per HPI RESPIRATORY: per HPI GASTROINTESTINAL: No anorexia, nausea, vomiting or diarrhea. No abdominal pain or blood.  GENITOURINARY: no polyuria, no dysuria NEUROLOGICAL: No headache, dizziness, syncope, paralysis, ataxia, numbness or tingling in the extremities. No change in bowel or bladder control.  MUSCULOSKELETAL: No muscle, back pain, joint pain or stiffness.  HEMATOLOGIC: No anemia, bleeding or bruising.  LYMPHATICS: No enlarged nodes. No history of splenectomy.  PSYCHIATRIC: No history of depression or anxiety.      Physical Examination Blood pressure 130/68, pulse (!) 58, temperature 99.1 F (37.3 C), temperature source Oral, resp. rate 12, height 5\' 6"  (1.676 m), weight 251 lb (113.9 kg), last menstrual period 08/15/2015, SpO2 98 %, unknown if currently breastfeeding.  Intake/Output Summary (Last 24 hours) at 05/25/16 1618 Last data filed at 05/25/16 1345  Gross per 24 hour  Intake                0 ml  Output              400 ml  Net             -400 ml    HEENT: scleara clear, throat clear  Cardiovascular: RRR, 2/6 systolic murmur  at apex, no jvd  Respiratory: faint crackles bilateral bases  GI: abdomen soft, NT, ND  MSK: 1+ bilateral LE edema.   Neuro: no focal deficits  Psych: appropriate affect  Lab Results  Basic Metabolic Panel:  Recent Labs Lab 05/25/16 1200 05/25/16 1332  NA 141  --   K 3.8  --   CL 112*  --   CO2 24  --   GLUCOSE 82  --   BUN 8  --   CREATININE 0.69  --   CALCIUM 8.7*  --   MG  --  1.9    Liver Function Tests:  Recent Labs Lab 05/25/16 1200  AST 23  ALT 22  ALKPHOS  91  BILITOT 0.6  PROT 5.6*  ALBUMIN 3.1*    CBC:  Recent Labs Lab 05/25/16 1200  WBC 7.8  NEUTROABS 5.6  HGB 9.9*  HCT 31.6*  MCV 81.9  PLT 237    Cardiac Enzymes: No results for input(s): CKTOTAL, CKMB, CKMBINDEX, TROPONINI in the last 168 hours.  BNP: Invalid input(s): POCBNP      Impression/Recommendations 1. LE edema/orthopnea - evidence of fairly mild volume overload, fairly mild symptoms though she has been fairly sedentary, though was able to sweep her kitchen floor without significant troubles - do not see strong indication for inpatient evaluation, will arrange outpatient echo and cardiology f/u - she is breast feeding, from literature review low dose HCTZ would be preferred diuretic. Recommend HCTZ 25mg  daily x 3 days then only take prn daily as need for swelling. Pending echo results may need consideration for addition medical therapy. We will work for echo this week and outpt f/u in 2 weeks. [place] for discharge from ER.     Carlyle Dolly, M.D.

## 2016-05-25 NOTE — ED Provider Notes (Signed)
[facility] DEPT Provider Note   CSN: [id number] Arrival date & time: 05/25/16  1131     History   Chief Complaint Chief Complaint  Patient presents with  . Shortness of Breath    HPI  Blood pressure 127/79, pulse (!) 54, temperature 99.1 F (37.3 C), temperature source Oral, resp. rate 19, height 5\' 6"  (1.676 m), weight 113.9 kg, last menstrual period 08/15/2015, SpO2 99 %, unknown if currently breastfeeding.  [name] is a 30 y.o. female complaining of this of breath worsening over the course of 2 days and lower extremity peripheral edema onset 6 days ago after she was released from the hospital. Patient is 10 days postpartum delivery via C-section, uncomplicated term birth, there was no hypertension in pregnancy no history of hypertension. She denies fever, chills, cough, palpitations, headache, change in vision, dysarthria, ataxia.  OB Rogue Bussing  HPI  Past Medical History:  Diagnosis Date  . Hx of migraines   . Hx of varicella     Patient Active Problem List   Diagnosis Date Noted  . Cesarean delivery delivered 05/01/2014  . Term pregnancy delivered 05/01/2014    Past Surgical History:  Procedure Laterality Date  . CESAREAN SECTION    . CESAREAN SECTION N/A 04/30/2014   Procedure: REPEAT CESAREAN SECTION;  Surgeon: Allyn Kenner, DO;  Location: [facility];  Service: Obstetrics;  Laterality: N/A;  . CESAREAN SECTION N/A 05/15/2016   Procedure: CESAREAN SECTION;  Surgeon: Allyn Kenner, DO;  Location: [facility];  Service: Obstetrics;  Laterality: N/A;  . TUBAL LIGATION Bilateral 05/15/2016   Procedure: BILATERAL TUBAL LIGATION;  Surgeon: Allyn Kenner, DO;  Location: [facility];  Service: Obstetrics;  Laterality: Bilateral;    OB History    Gravida Para Term Preterm AB Living   4 4 2     4    SAB TAB Ectopic Multiple Live Births         0 2       Home Medications    Prior to Admission medications   Medication Sig Start Date End Date  Taking? Authorizing Provider  butalbital-acetaminophen-caffeine (FIORICET, ESGIC) 50-325-40 MG tablet Take 1 tablet by mouth every 4 (four) hours as needed for headache.   Yes Historical Provider, MD  ibuprofen (ADVIL,MOTRIN) 100 MG tablet Take 800 mg by mouth every 8 (eight) hours as needed for fever.   Yes Historical Provider, MD  oxyCODONE-acetaminophen (PERCOCET/ROXICET) 5-325 MG tablet Take 1 tablet by mouth every 4 (four) hours as needed (pain scale 4-7). 05/17/16  Yes Bobbye Charleston, MD  Prenatal Vit-Fe Fumarate-FA (MULTIVITAMIN-PRENATAL) 27-0.8 MG TABS tablet Take 1 tablet by mouth daily at 12 noon.   Yes Historical Provider, MD    Family History Family History  Problem Relation Age of Onset  . Diabetes Mother   . Hypertension Mother   . Hyperlipidemia Other   . Obesity Other   . Diabetes Maternal Aunt   . Hypertension Maternal Aunt   . Diabetes Paternal Aunt   . Diabetes Maternal Grandmother   . Diabetes Paternal Grandfather     Social History Social History  Substance Use Topics  . Smoking status: Never Smoker  . Smokeless tobacco: Never Used  . Alcohol use No     Allergies   Review of patient's allergies indicates no known allergies.   Review of Systems Review of Systems  10 systems reviewed and found to be negative, except as noted in the HPI.   Physical Exam Updated Vital Signs BP 138/81  Pulse 62   Temp 99.1 F (37.3 C) (Oral)   Resp 22   Ht 5\' 6"  (1.676 m)   Wt 113.9 kg   LMP 08/15/2015   SpO2 100%   BMI 40.51 kg/m   Physical Exam  Constitutional: She is oriented to person, place, and time. She appears well-developed and well-nourished. No distress.  HENT:  Head: Normocephalic and atraumatic.  Mouth/Throat: Oropharynx is clear and moist.  Eyes: Conjunctivae and EOM are normal. Pupils are equal, round, and reactive to light.  Neck: Normal range of motion. No JVD present. No tracheal deviation present.  Cardiovascular: Normal rate, regular  rhythm and intact distal pulses.   Radial pulse equal bilaterally  Pulmonary/Chest: Effort normal and breath sounds normal. No stridor. No respiratory distress. She has no wheezes. She has no rales. She exhibits no tenderness.  Abdominal: Soft. She exhibits no distension and no mass. There is no tenderness. There is no rebound and no guarding.  Musculoskeletal: Normal range of motion. She exhibits edema. She exhibits no tenderness.  2+ bilateral lower extremity pitting edema to midshin  No calf asymmetry, superficial collaterals, palpable cords, edema, Homans sign negative bilaterally.    Neurological: She is alert and oriented to person, place, and time.  Skin: Skin is warm. She is not diaphoretic.  Psychiatric: She has a normal mood and affect.  Nursing note and vitals reviewed.     ED Treatments / Results  Labs (all labs ordered are listed, but only abnormal results are displayed) Labs Reviewed  BRAIN NATRIURETIC PEPTIDE - Abnormal; Notable for the following:       Result Value   B Natriuretic Peptide 201.2 (*)    All other components within normal limits  CBC WITH DIFFERENTIAL/PLATELET - Abnormal; Notable for the following:    RBC 3.86 (*)    Hemoglobin 9.9 (*)    HCT 31.6 (*)    MCH 25.6 (*)    All other components within normal limits  COMPREHENSIVE METABOLIC PANEL - Abnormal; Notable for the following:    Chloride 112 (*)    Calcium 8.7 (*)    Total Protein 5.6 (*)    Albumin 3.1 (*)    All other components within normal limits  BRAIN NATRIURETIC PEPTIDE - Abnormal; Notable for the following:    B Natriuretic Peptide 259.2 (*)    All other components within normal limits  PROTEIN / CREATININE RATIO, URINE  MAGNESIUM  TSH  I-STAT TROPOININ, ED    EKG  EKG Interpretation  Date/Time:  Monday May 25 2016 11:40:20 EDT Ventricular Rate:  57 PR Interval:  120 QRS Duration: 96 QT Interval:  410 QTC Calculation: 399 R Axis:   73 Text Interpretation:  Sinus  bradycardia with sinus arrhythmia Otherwise normal ECG No significant change since last tracing Confirmed by Winfred Leeds  MD, SAM ([id number]) on 05/25/2016 11:56:20 AM       Radiology Ct Angio Chest Pe W And/or Wo Contrast  Result Date: 05/25/2016 CLINICAL DATA:  Tightness in chest and shortness of breath for 10 days, postpartum 05/13/2016 EXAM: CT ANGIOGRAPHY CHEST WITH CONTRAST TECHNIQUE: Multidetector CT imaging of the chest was performed using the standard protocol during bolus administration of intravenous contrast. Multiplanar CT image reconstructions and MIPs were obtained to evaluate the vascular anatomy. CONTRAST:  100 cc Isovue 370 IV COMPARISON:  None FINDINGS: Cardiovascular: Aorta normal caliber without aneurysm or dissection. No pericardial effusion. Pulmonary arteries well opacified and patent. No evidence of pulmonary embolism. Mediastinum/Nodes: 18 x 15  mm nodule in LEFT breast image 32. Scattered normal sized axillary nodes. Mediastinum clear. Lungs/Pleura: Central patchy airspace infiltrates throughout both lungs question pneumonia though edema could cause a similar appearance. No pneumothorax. Small BILATERAL pleural effusions. Upper Abdomen: Normal appearance Musculoskeletal: Normal appearance Review of the MIP images confirms the above findings. IMPRESSION: No evidence of pulmonary embolism. Patchy central airspace infiltrates throughout the lungs bilaterally which could represent infection or edema. Small BILATERAL pleural effusions. Electronically Signed   By: Lavonia Dana M.D.   On: 05/25/2016 13:53   Dg Chest Port 1 View  Result Date: 05/25/2016 CLINICAL DATA:  Acute shortness of breath. EXAM: PORTABLE CHEST 1 VIEW COMPARISON:  None. FINDINGS: Upper limits normal heart size noted. Mild pulmonary vascular congestion identified. There is no evidence of focal airspace disease, pulmonary edema, suspicious pulmonary nodule/mass, pleural effusion, or pneumothorax. No acute bony abnormalities  are identified. IMPRESSION: Upper limits normal heart size with mild pulmonary vascular congestion. Electronically Signed   By: Margarette Canada M.D.   On: 05/25/2016 12:13    Procedures Procedures (including critical care time)  Medications Ordered in ED Medications  ipratropium-albuterol (DUONEB) 0.5-2.5 (3) MG/3ML nebulizer solution 3 mL (3 mLs Nebulization Given 05/25/16 1416)  iopamidol (ISOVUE-370) 76 % injection (100 mLs  Contrast Given 05/25/16 1305)  nitroGLYCERIN (NITROGLYN) 2 % ointment 1 inch (1 inch Topical Given 05/25/16 1517)     Initial Impression / Assessment and Plan / ED Course  I have reviewed the triage vital signs and the nursing notes.  Pertinent labs & imaging results that were available during my care of the patient were reviewed by me and considered in my medical decision making (see chart for details).  Clinical Course    Vitals:   05/25/16 1445 05/25/16 1457 05/25/16 1500 05/25/16 1515  BP: 143/74 143/74 141/82 138/81  Pulse: (!) 57 64 81 62  Resp:  23 16 22   Temp:      TempSrc:      SpO2: 99% 98% 98% 100%  Weight:      Height:        Medications  ipratropium-albuterol (DUONEB) 0.5-2.5 (3) MG/3ML nebulizer solution 3 mL (3 mLs Nebulization Given 05/25/16 1416)  iopamidol (ISOVUE-370) 76 % injection (100 mLs  Contrast Given 05/25/16 1305)  nitroGLYCERIN (NITROGLYN) 2 % ointment 1 inch (1 inch Topical Given 05/25/16 1517)    [name] is 30 y.o. female presenting with Increasing peripheral edema status post C-section 10 days ago with associated shortness of breath onset 2 days ago. Patient has pitting edema, JVD, crackles at bilateral bases consistent with the postpartum cardiomyopathy. Also consideration is given for help/preeclampsia given the slightly elevated blood pressure, also consider PE.  EKG with no acute findings, urine protein to creatinine ratio is normal, no transaminitis, platelets normal. BNP is elevated at over 200, chest x-rays consistent with  slight CHF.  CT with no pulmonary embolism but diffuse edema also consistent with CHF.  Discussed with Dr. Philis Pique of OB/GYN who states that hospitalist will have to manage postpartum cardiomyopathy and that they don't have telemetry monitoring at [facility] hospital.  Discussed case with cardiologist Dr. Meyer Cory who will evaluate the patient. Patient will be given Nitropaste,  D/w Merrilee Seashore ED pharmacist: should be okay to give a topical nitroglycerin placed in pregnancy.  Case signed out to Dr. Olen Pel who requests TSH and will f/u Cardiology evaluation   Final Clinical Impressions(s) / ED Diagnoses   Final diagnoses:  SOB (shortness of breath)  New Prescriptions New Prescriptions   No medications on file     Monico Blitz, PA-C 05/25/16 [place], MD 05/25/16 1725

## 2016-05-25 NOTE — ED Triage Notes (Addendum)
Patient complains of shortness of breath, " I cant breath x 2 days" . 10 days post partum. Denies CP. Swelling to ankles noted with wheezing, no history of respiratory illness. Alert and oriented, accessory muscle use noted following ambulation into triage

## 2016-05-25 NOTE — ED Notes (Signed)
Manual bp was 130/68

## 2016-05-25 NOTE — ED Notes (Signed)
Pt stable, ambulatory, states understanding of discharge instructions 

## 2016-05-25 NOTE — ED Notes (Signed)
Patient came in today with c/o shortness of breath for the last two days. Onset of shortness of breath occurred when patient laid down to go to sleep. Patient states when she lays down she feel severe pressure in her chest that makes her feel like she can't breathe. When patient is sitting up she states the chest pressure is mild. Pt a/ox4. O2 sats are 100% on room air. Patient had c-section 10 days ago. Abd incision healing well. No hx of asthma, obstructive sleep apnea, or COPD.

## 2016-05-25 NOTE — ED Provider Notes (Signed)
Complains of shortness of breath sudden onset 2 nights ago. Symptoms worse with lying flat improved with sitting up. No cough no fever no chest pain. Denies pain anywhere. No other associated symptoms on exam alert speaks in paragraphs. HEENT exam no facial asymmetry neck supple positive JVD lungs scant extra wheezes. Tachypnea. Abdomen obese nontender. Bilateral lower extremities with 1+ pretibial pitting edema. Chest x-ray viewed by me   Orlie Dakin, MD 05/25/16 1725

## 2016-05-25 NOTE — ED Provider Notes (Signed)
Assumed care from [facility] at 1600. Pt 10 days post partum. Awaiting cards recs.  Per cardiology consult note, cardiology recommends HCTZ 25 mg daily for 3 days. Patient is then only to take HCTZ when necessary for lower extremity edema. Pt agreeable to discharge at this time.    Chapman Moss, MD 05/28/16 [id number]    Sherwood Gambler, MD 06/03/16 [phone]

## 2016-06-09 ENCOUNTER — Other Ambulatory Visit (HOSPITAL_COMMUNITY): Payer: Managed Care, Other (non HMO)

## 2016-06-15 NOTE — Op Note (Deleted)
  The note originally documented on this encounter has been moved the the encounter in which it belongs.  

## 2016-06-15 NOTE — Op Note (Signed)
[name], [name] NO.:  [id number]  MEDICAL RECORD NO.:  [id number]  LOCATION:                                 FACILITY:  PHYSICIAN:  Allyn Kenner, DO    DATE OF BIRTH:  [birth year]  DATE OF PROCEDURE:  05/15/2016 DATE OF DISCHARGE:                              OPERATIVE REPORT   PREOPERATIVE DIAGNOSIS:  Fourth C-section with tubal ligation.  POSTOPERATIVE DIAGNOSIS:  Fourth C-section with tubal ligation.  PROCEDURE:  Repeat cesarean section with bilateral tubal ligation.  ASSISTANT:  Alden Hipp, M.D.  ANESTHESIA:  Spinal.  IV FLUIDS:  3200 mL.  URINE OUTPUT:  200 mL.  ESTIMATED BLOOD LOSS:  900 mL.  FINDINGS:  Normal ovaries and tubes bilaterally.  Significant anterior abdominal wall scar tissue with some involvement of the bladder, otherwise, serosal surfaces of uterus intact and normal.  Female infant in cephalic presentation with Apgars of 8 and 8 and weight 7 pounds 3 ounces.  DESCRIPTION OF PROCEDURE:  The patient was taken to the operating room, where anesthesia was administered and found to be adequate.  She was prepped and draped in the normal sterile fashion in dorsal supine position with a leftward tilt.  A scalpel was used to create a Pfannenstiel skin incision, which was carried down to the underlying layer of fascia with Bovie cautery.  As noted above, the anterior abdominal wall layers were very scarred and difficult to identify, so very careful methodical dissection was performed to the peritoneum which was also adherent to the fascia and anterior abdominal wall through scar tissue.  Once entrance into the peritoneum was performed, gentle lateral traction was then performed to expand the abdominal incision.  Manual palpation of the uterus down to the lower uterine segment was performed. An Alexis self retractor was then placed and Metzenbaum scissors were used to incise tented vesicouterine peritoneum and a bladder flap  were attempted to create digitally, although scarring made this difficult. The bladder was found to be low enough to be out of the operative field. A scalpel was then used to enter the lower uterine segment, which was quite thin.  The amniotic sac protruded from this incision and gentle traction was performed caudal and cephalad was then performed.  The infant's head was located, elevated, and delivered without difficulty followed by the body.  The cord was clamped and cut, and the infant was handed off to awaiting Neonatology.  Cord blood was collected, and external massage of the uterus was performed to improve tone while gentle traction was placed on the umbilical cord.  The placenta was removed without difficulty, and the uterus was cleared of all clot and debris.  Attention was turned to the tube.  The left tube was first identified, followed to its fimbriated end, and a Filshie clip applied surrounding the entire tube.  The same was performed on the right side, and the Alexis self retractor was then removed.  No active bleeding was noted.  Excellent hemostasis was achieved.  The anterior abdominal wall tissues were reapproximated and closed in a mass fashion in a running stitch.  The subcutaneous tissue was irrigated, dried, and minimal use  of Bovie cautery was needed.  The skin was then reapproximated and closed with staples.  Sponge, lap, and needle counts were correct x2.  The patient tolerated the procedure well.  The patient was taken to recovery in stable condition.    ______________________________ Allyn Kenner, DO   ______________________________ Allyn Kenner, DO    [place]/MEDQ  D:  06/14/2016  T:  06/14/2016  Job:  [id number]

## 2016-06-22 ENCOUNTER — Other Ambulatory Visit (HOSPITAL_COMMUNITY): Payer: Managed Care, Other (non HMO)

## 2016-08-07 ENCOUNTER — Encounter: Payer: Self-pay | Admitting: *Deleted

## 2016-10-08 ENCOUNTER — Ambulatory Visit: Payer: Managed Care, Other (non HMO) | Admitting: Nurse Practitioner

## 2016-10-29 ENCOUNTER — Other Ambulatory Visit (INDEPENDENT_AMBULATORY_CARE_PROVIDER_SITE_OTHER): Payer: Managed Care, Other (non HMO)

## 2016-10-29 ENCOUNTER — Ambulatory Visit (INDEPENDENT_AMBULATORY_CARE_PROVIDER_SITE_OTHER): Payer: Managed Care, Other (non HMO) | Admitting: Nurse Practitioner

## 2016-10-29 ENCOUNTER — Encounter: Payer: Self-pay | Admitting: Nurse Practitioner

## 2016-10-29 VITALS — BP 110/70 | HR 74 | Temp 98.7°F | Ht 66.0 in | Wt 236.0 lb

## 2016-10-29 DIAGNOSIS — R202 Paresthesia of skin: Secondary | ICD-10-CM

## 2016-10-29 DIAGNOSIS — O24410 Gestational diabetes mellitus in pregnancy, diet controlled: Secondary | ICD-10-CM

## 2016-10-29 DIAGNOSIS — D649 Anemia, unspecified: Secondary | ICD-10-CM | POA: Diagnosis not present

## 2016-10-29 DIAGNOSIS — O24419 Gestational diabetes mellitus in pregnancy, unspecified control: Secondary | ICD-10-CM | POA: Insufficient documentation

## 2016-10-29 DIAGNOSIS — Z0000 Encounter for general adult medical examination without abnormal findings: Secondary | ICD-10-CM | POA: Diagnosis not present

## 2016-10-29 LAB — MICROALBUMIN / CREATININE URINE RATIO
Creatinine,U: 171.7 mg/dL
Microalb Creat Ratio: 0.5 mg/g (ref 0.0–30.0)
Microalb, Ur: 0.8 mg/dL (ref 0.0–1.9)

## 2016-10-29 LAB — CBC WITH DIFFERENTIAL/PLATELET
Basophils Absolute: 0 10*3/uL (ref 0.0–0.1)
Basophils Relative: 0.5 % (ref 0.0–3.0)
Eosinophils Absolute: 0.1 10*3/uL (ref 0.0–0.7)
Eosinophils Relative: 2.3 % (ref 0.0–5.0)
HCT: 37.5 % (ref 36.0–46.0)
Hemoglobin: 12.2 g/dL (ref 12.0–15.0)
Lymphocytes Relative: 30.3 % (ref 12.0–46.0)
Lymphs Abs: 1.6 10*3/uL (ref 0.7–4.0)
MCHC: 32.6 g/dL (ref 30.0–36.0)
MCV: 79.6 fl (ref 78.0–100.0)
Monocytes Absolute: 0.4 10*3/uL (ref 0.1–1.0)
Monocytes Relative: 8.4 % (ref 3.0–12.0)
Neutro Abs: 3 10*3/uL (ref 1.4–7.7)
Neutrophils Relative %: 58.5 % (ref 43.0–77.0)
Platelets: 224 10*3/uL (ref 150.0–400.0)
RBC: 4.71 Mil/uL (ref 3.87–5.11)
RDW: 12.9 % (ref 11.5–15.5)
WBC: 5.1 10*3/uL (ref 4.0–10.5)

## 2016-10-29 LAB — TSH: TSH: 0.04 u[IU]/mL — ABNORMAL LOW (ref 0.35–4.50)

## 2016-10-29 LAB — COMPREHENSIVE METABOLIC PANEL
ALT: 39 U/L — ABNORMAL HIGH (ref 0–35)
AST: 49 U/L — ABNORMAL HIGH (ref 0–37)
Albumin: 4.2 g/dL (ref 3.5–5.2)
Alkaline Phosphatase: 69 U/L (ref 39–117)
BUN: 8 mg/dL (ref 6–23)
CO2: 30 mEq/L (ref 19–32)
Calcium: 9.4 mg/dL (ref 8.4–10.5)
Chloride: 107 mEq/L (ref 96–112)
Creatinine, Ser: 0.67 mg/dL (ref 0.40–1.20)
GFR: 131.55 mL/min (ref >60.00)
Glucose, Bld: 84 mg/dL (ref 70–99)
Potassium: 3.9 mEq/L (ref 3.5–5.1)
Sodium: 141 mEq/L (ref 135–145)
Total Bilirubin: 0.4 mg/dL (ref 0.2–1.2)
Total Protein: 7.1 g/dL (ref 6.0–8.3)

## 2016-10-29 LAB — VITAMIN B12: Vitamin B-12: 474 pg/mL (ref 211–911)

## 2016-10-29 LAB — HEMOGLOBIN A1C: Hgb A1c MFr Bld: 5.1 % (ref 4.6–6.5)

## 2016-10-29 NOTE — Progress Notes (Signed)
Pre visit review using our clinic review tool, if applicable. No additional management support is needed unless otherwise documented below in the visit note. 

## 2016-10-29 NOTE — Progress Notes (Signed)
Subjective:    Patient ID: [name], female    DOB: 02-01-1986, 31 y.o.   MRN: [id number]  Patient presents today for establish care (new patient)  HPI  Generalized numbness: Ongoing since 06/2016, intermittent. No aggravating factor. No associated symptoms.  Immunizations: (TDAP, Hep C screen, Pneumovax, Influenza, zoster)  Health Maintenance  Topic Date Due  . Hemoglobin A1C  1985/09/18  . Pneumococcal vaccine (1) 04/10/1987  . Complete foot exam   04/10/1995  . Eye exam for diabetics  04/10/1995  . Urine Protein Check  04/10/1995  . Flu Shot  10/22/2017*  . Pap Smear  05/23/2019  . Tetanus Vaccine  05/01/2024  . HIV Screening  Completed  *Topic was postponed. The date shown is not the original due date.   Diet:regular Weight:  Wt Readings from Last 3 Encounters:  10/29/16 236 lb (107 kg)  05/25/16 251 lb (113.9 kg)  05/07/16 248 lb (112.5 kg)   Exercise:none Fall Risk: Fall Risk  10/29/2016 03/12/2016 02/08/2014  Falls in the past year? No No No   Home Safety:home with husband and 4children Depression/Suicide: Depression screen [facility] 2/9 10/29/2016 03/12/2016 02/08/2014  Decreased Interest 0 0 0  Down, Depressed, Hopeless 0 0 -  PHQ - 2 Score 0 0 0   No flowsheet data found. Pap Smear (every 99yrs for >21-29 without HPV, every 65yrs for >30-62yrs with HPV):up to date, last done 05/2016 (normal per patient) Vision:needed Dental:needed Advanced Directive: Advanced Directives 05/25/2016  Does Patient Have a Medical Advance Directive? No  Would patient like information on creating a medical advance directive? No - patient declined information   Sexual History (birth control, marital status, STD):married, sexuallt active, tubal ligation, has 96months old girl, currently breastfeeding  Medications and allergies reviewed with patient and updated if appropriate.  Patient Active Problem List   Diagnosis Date Noted  . Gestational diabetes 10/29/2016  . Paresthesia of both  lower extremities 10/29/2016    Current Outpatient Prescriptions on File Prior to Visit  Medication Sig Dispense Refill  . ibuprofen (ADVIL,MOTRIN) 100 MG tablet Take 800 mg by mouth every 8 (eight) hours as needed for fever.    . Prenatal Vit-Fe Fumarate-FA (MULTIVITAMIN-PRENATAL) 27-0.8 MG TABS tablet Take 1 tablet by mouth daily at 12 noon.     No current facility-administered medications on file prior to visit.     Past Medical History:  Diagnosis Date  . Chicken pox   . Headaches   . Hx of migraines   . Hx of varicella     Past Surgical History:  Procedure Laterality Date  . CESAREAN SECTION  2003 & 2006  . CESAREAN SECTION N/A 04/30/2014   Procedure: REPEAT CESAREAN SECTION;  Surgeon: Allyn Kenner, DO;  Location: [facility];  Service: Obstetrics;  Laterality: N/A;  . CESAREAN SECTION N/A 05/15/2016   Procedure: CESAREAN SECTION;  Surgeon: Allyn Kenner, DO;  Location: [facility];  Service: Obstetrics;  Laterality: N/A;  . TUBAL LIGATION Bilateral 05/15/2016   Procedure: BILATERAL TUBAL LIGATION;  Surgeon: Allyn Kenner, DO;  Location: [facility];  Service: Obstetrics;  Laterality: Bilateral;    Social History   Social History  . Marital status: Married    Spouse name: N/A  . Number of children: N/A  . Years of education: N/A   Social History Main Topics  . Smoking status: Never Smoker  . Smokeless tobacco: Never Used  . Alcohol use No  . Drug use: No  . Sexual activity: Yes  Birth control/ protection: None   Other Topics Concern  . None   Social History Narrative  . None    Family History  Problem Relation Age of Onset  . Diabetes Mother   . Hypertension Mother   . Alcohol abuse Mother   . Hyperlipidemia Mother   . Cancer Mother 39    breast cancer  . Hyperlipidemia Other   . Obesity Other   . Diabetes Maternal Aunt   . Hypertension Maternal Aunt   . Kidney disease Maternal Aunt   . Diabetes Paternal Aunt   . Hypertension  Paternal Aunt   . Diabetes Maternal Grandmother   . Heart disease Maternal Grandmother   . Diabetes Paternal Grandfather   . Hypertension Maternal Grandfather         Review of Systems  Constitutional: Negative for fever, malaise/fatigue and weight loss.  HENT: Negative for congestion and sore throat.   Eyes:       Negative for visual changes  Respiratory: Negative for cough and shortness of breath.   Cardiovascular: Negative for chest pain, palpitations and leg swelling.  Gastrointestinal: Negative for blood in stool, constipation, diarrhea and heartburn.  Genitourinary: Negative for dysuria, frequency and urgency.  Musculoskeletal: Negative for falls, joint pain and myalgias.  Skin: Negative for rash.  Neurological: Positive for tingling. Negative for dizziness, sensory change and headaches.  Endo/Heme/Allergies: Does not bruise/bleed easily.  Psychiatric/Behavioral: Negative for depression, substance abuse and suicidal ideas. The patient is not nervous/anxious.     Objective:   Vitals:   10/29/16 1113  BP: 110/70  Pulse: 74  Temp: 98.7 F (37.1 C)    Body mass index is 38.09 kg/m.   Physical Examination:  Physical Exam  Constitutional: She is oriented to person, place, and time and well-developed, well-nourished, and in no distress. No distress.  HENT:  Right Ear: External ear normal.  Left Ear: External ear normal.  Nose: Nose normal.  Mouth/Throat: Oropharynx is clear and moist. No oropharyngeal exudate.  Eyes: Conjunctivae and EOM are normal. Pupils are equal, round, and reactive to light. No scleral icterus.  Neck: Normal range of motion. Neck supple. No thyromegaly present.  Cardiovascular: Normal rate, normal heart sounds and intact distal pulses.   Pulmonary/Chest: Effort normal and breath sounds normal. She exhibits no tenderness.  Abdominal: Soft. Bowel sounds are normal. She exhibits no distension. There is no tenderness.  Musculoskeletal: Normal  range of motion. She exhibits no edema or tenderness.  Lymphadenopathy:    She has no cervical adenopathy.  Neurological: She is alert and oriented to person, place, and time. Gait normal.  Skin: Skin is warm and dry.  Psychiatric: Affect and judgment normal.    ASSESSMENT and PLAN:  [name] was seen today for establish care.  Diagnoses and all orders for this visit:  Encounter for medical examination to establish care  Diet controlled gestational diabetes mellitus (GDM), antepartum -     Hemoglobin A1c; Future -     Microalbumin / creatinine urine ratio; Future -     Comprehensive metabolic panel; Future  Paresthesia of both lower extremities -     TSH; Future -     B12; Future -     Comprehensive metabolic panel; Future  Anemia, unspecified type -     CBC w/Diff; Future   No problem-specific Assessment & Plan notes found for this encounter.     Follow up: Return in about 3 months (around 01/26/2017) for DM and paresthesia.  Wilfred Lacy, NP

## 2016-10-29 NOTE — Patient Instructions (Addendum)
Go to basement for blood draw and to provide urine sample. You will be called with results.  schedule eye exam and bring report.

## 2017-01-26 ENCOUNTER — Ambulatory Visit: Payer: Managed Care, Other (non HMO) | Admitting: Nurse Practitioner

## 2017-04-10 ENCOUNTER — Emergency Department (HOSPITAL_COMMUNITY)
Admission: EM | Admit: 2017-04-10 | Discharge: 2017-04-11 | Disposition: A | Discharge status: Home / Self Care | Payer: Managed Care, Other (non HMO) | Attending: Emergency Medicine | Admitting: Emergency Medicine

## 2017-04-10 ENCOUNTER — Encounter (HOSPITAL_COMMUNITY): Payer: Self-pay | Admitting: Emergency Medicine

## 2017-04-10 DIAGNOSIS — K8050 Calculus of bile duct without cholangitis or cholecystitis without obstruction: Secondary | ICD-10-CM | POA: Diagnosis not present

## 2017-04-10 DIAGNOSIS — R1013 Epigastric pain: Secondary | ICD-10-CM | POA: Diagnosis present

## 2017-04-10 DIAGNOSIS — K8020 Calculus of gallbladder without cholecystitis without obstruction: Secondary | ICD-10-CM | POA: Insufficient documentation

## 2017-04-10 DIAGNOSIS — E119 Type 2 diabetes mellitus without complications: Secondary | ICD-10-CM | POA: Diagnosis not present

## 2017-04-10 LAB — URINALYSIS, ROUTINE W REFLEX MICROSCOPIC
Bilirubin Urine: NEGATIVE
Glucose, UA: NEGATIVE mg/dL
Ketones, ur: NEGATIVE mg/dL
Nitrite: NEGATIVE
Protein, ur: NEGATIVE mg/dL
Specific Gravity, Urine: 1.025 (ref 1.005–1.030)
pH: 5 (ref 5.0–8.0)

## 2017-04-10 LAB — CBC
HCT: 36.6 % (ref 36.0–46.0)
Hemoglobin: 12.2 g/dL (ref 12.0–15.0)
MCH: 26.7 pg (ref 26.0–34.0)
MCHC: 33.3 g/dL (ref 30.0–36.0)
MCV: 80.1 fL (ref 78.0–100.0)
Platelets: 241 10*3/uL (ref 150–400)
RBC: 4.57 MIL/uL (ref 3.87–5.11)
RDW: 13.1 % (ref 11.5–15.5)
WBC: 8 10*3/uL (ref 4.0–10.5)

## 2017-04-10 LAB — COMPREHENSIVE METABOLIC PANEL
ALT: 15 U/L (ref 14–54)
AST: 17 U/L (ref 15–41)
Albumin: 3.9 g/dL (ref 3.5–5.0)
Alkaline Phosphatase: 67 U/L (ref 38–126)
Anion gap: 8 (ref 5–15)
BUN: 8 mg/dL (ref 6–20)
CO2: 24 mmol/L (ref 22–32)
Calcium: 8.9 mg/dL (ref 8.9–10.3)
Chloride: 106 mmol/L (ref 101–111)
Creatinine, Ser: 0.84 mg/dL (ref 0.44–1.00)
GFR calc Af Amer: >60 mL/min (ref >60)
GFR calc non Af Amer: >60 mL/min (ref >60)
Glucose, Bld: 100 mg/dL — ABNORMAL HIGH (ref 65–99)
Potassium: 3.5 mmol/L (ref 3.5–5.1)
Sodium: 138 mmol/L (ref 135–145)
Total Bilirubin: 0.4 mg/dL (ref 0.3–1.2)
Total Protein: 7 g/dL (ref 6.5–8.1)

## 2017-04-10 LAB — LIPASE, BLOOD: Lipase: 29 U/L (ref 11–51)

## 2017-04-10 LAB — I-STAT BETA HCG BLOOD, ED (MC, WL, AP ONLY): I-stat hCG, quantitative: <5 m[IU]/mL (ref <5)

## 2017-04-10 NOTE — ED Triage Notes (Signed)
Pt reports upper abd pain for the last week with nausea. Pt denies any vomiting or diarrhea. Pt reports last oral intake at 2100.

## 2017-04-11 ENCOUNTER — Emergency Department (HOSPITAL_COMMUNITY): Payer: Managed Care, Other (non HMO)

## 2017-04-11 MED ORDER — FAMOTIDINE IN NACL 20-0.9 MG/50ML-% IV SOLN
20.0000 mg | Freq: Once | INTRAVENOUS | Status: AC
  Administered 2017-04-11: 20 mg via INTRAVENOUS
  Filled 2017-04-11: qty 50

## 2017-04-11 MED ORDER — KETOROLAC TROMETHAMINE 30 MG/ML IJ SOLN
30.0000 mg | Freq: Once | INTRAMUSCULAR | Status: AC
  Administered 2017-04-11: 30 mg via INTRAVENOUS
  Filled 2017-04-11: qty 1

## 2017-04-11 MED ORDER — HYDROCODONE-ACETAMINOPHEN 5-325 MG PO TABS: 1.0000 | ORAL_TABLET | Freq: Three times a day (TID) | ORAL | 0 refills | Status: DC | PRN

## 2017-04-11 MED ORDER — SODIUM CHLORIDE 0.9 % IV BOLUS (SEPSIS)
1000.0000 mL | Freq: Once | INTRAVENOUS | Status: AC
  Administered 2017-04-11: 1000 mL via INTRAVENOUS

## 2017-04-11 MED ORDER — ONDANSETRON HCL 4 MG/2ML IJ SOLN
4.0000 mg | Freq: Once | INTRAMUSCULAR | Status: AC
  Administered 2017-04-11: 4 mg via INTRAVENOUS
  Filled 2017-04-11: qty 2

## 2017-04-11 NOTE — ED Provider Notes (Signed)
[facility] DEPT Provider Note   CSN: [id number] Arrival date & time: 04/10/17  2308    History   Chief Complaint Chief Complaint  Patient presents with  . Abdominal Pain    HPI [name] is a 31 y.o. female.  31 year old female with no significant past medical history presents to the emergency department for evaluation of intermittent, sharp, epigastric abdominal pain which has been present for the past week. Pain is nonradiating. Symptoms worsened with eating, especially this evening after having a slice of cheesecake. Patient notes associated nausea without emesis. She has not had any fevers, diarrhea, melena, hematochezia, urinary symptoms. Abdominal surgical history significant for cesarean section 4. No medications taken prior to arrival for pain.      Past Medical History:  Diagnosis Date  . Chicken pox   . Headaches   . Hx of migraines   . Hx of varicella     Patient Active Problem List   Diagnosis Date Noted  . Gestational diabetes 10/29/2016  . Paresthesia of both lower extremities 10/29/2016    Past Surgical History:  Procedure Laterality Date  . CESAREAN SECTION  2003 & 2006  . CESAREAN SECTION N/A 04/30/2014   Procedure: REPEAT CESAREAN SECTION;  Surgeon: Allyn Kenner, DO;  Location: [facility];  Service: Obstetrics;  Laterality: N/A;  . CESAREAN SECTION N/A 05/15/2016   Procedure: CESAREAN SECTION;  Surgeon: Allyn Kenner, DO;  Location: [facility];  Service: Obstetrics;  Laterality: N/A;  . TUBAL LIGATION Bilateral 05/15/2016   Procedure: BILATERAL TUBAL LIGATION;  Surgeon: Allyn Kenner, DO;  Location: [facility];  Service: Obstetrics;  Laterality: Bilateral;    OB History    Gravida Para Term Preterm AB Living   4 4 2     4    SAB TAB Ectopic Multiple Live Births         0 2       Home Medications    Prior to Admission medications   Medication Sig Start Date End Date Taking? Authorizing Provider    HYDROcodone-acetaminophen (NORCO/VICODIN) 5-325 MG tablet Take 1-2 tablets by mouth every 8 (eight) hours as needed for severe pain. 04/11/17   Antonietta Breach, PA-C    Family History Family History  Problem Relation Age of Onset  . Diabetes Mother   . Hypertension Mother   . Alcohol abuse Mother   . Hyperlipidemia Mother   . Cancer Mother 74       breast cancer  . Hyperlipidemia Other   . Obesity Other   . Diabetes Maternal Aunt   . Hypertension Maternal Aunt   . Kidney disease Maternal Aunt   . Diabetes Paternal Aunt   . Hypertension Paternal Aunt   . Diabetes Maternal Grandmother   . Heart disease Maternal Grandmother   . Diabetes Paternal Grandfather   . Hypertension Maternal Grandfather     Social History Social History  Substance Use Topics  . Smoking status: Never Smoker  . Smokeless tobacco: Never Used  . Alcohol use No     Allergies   Patient has no known allergies.   Review of Systems Review of Systems Ten systems reviewed and are negative for acute change, except as noted in the HPI.    Physical Exam Updated Vital Signs BP (!) 146/82 (BP Location: Left Arm)   Pulse 66   Temp 98 F (36.7 C) (Oral)   Resp 18   Ht 5\' 6"  (1.676 m)   Wt 101.2 kg (223 lb)   LMP  02/19/2017   SpO2 100%   BMI 35.99 kg/m   Physical Exam  Constitutional: She is oriented to person, place, and time. She appears well-developed and well-nourished. No distress.  Nontoxic and in NAD  HENT:  Head: Normocephalic and atraumatic.  Eyes: Conjunctivae and EOM are normal. No scleral icterus.  Neck: Normal range of motion.  Cardiovascular: Normal rate, regular rhythm and intact distal pulses.   Pulmonary/Chest: Effort normal. No respiratory distress. She has no wheezes.  Respirations even and unlabored  Abdominal: Soft. She exhibits no mass. There is tenderness. There is guarding.  TTP in the epigastrium as well as the RUQ with voluntary guarding. Positive Murphy's sign. No  peritoneal signs; abdomen soft, obese.  Musculoskeletal: Normal range of motion.  Neurological: She is alert and oriented to person, place, and time. She exhibits normal muscle tone. Coordination normal.  GCS 15. Patient moving all extremities.  Skin: Skin is warm and dry. No rash noted. She is not diaphoretic. No erythema. No pallor.  Psychiatric: She has a normal mood and affect. Her behavior is normal.  Nursing note and vitals reviewed.    ED Treatments / Results  Labs (all labs ordered are listed, but only abnormal results are displayed) Labs Reviewed  COMPREHENSIVE METABOLIC PANEL - Abnormal; Notable for the following:       Result Value   Glucose, Bld 100 (*)    All other components within normal limits  URINALYSIS, ROUTINE W REFLEX MICROSCOPIC - Abnormal; Notable for the following:    APPearance HAZY (*)    Hgb urine dipstick SMALL (*)    Leukocytes, UA SMALL (*)    Bacteria, UA RARE (*)    Squamous Epithelial / LPF 6-30 (*)    All other components within normal limits  LIPASE, BLOOD  CBC  I-STAT BETA HCG BLOOD, ED ([facility], [facility], [facility] ONLY)    EKG  EKG Interpretation None       Radiology US Abdomen Complete  Result Date: 04/11/2017 CLINICAL DATA:  Right upper quadrant pain EXAM: ABDOMEN ULTRASOUND COMPLETE COMPARISON:  CT abdomen pelvis 11/06/2014 FINDINGS: Gallbladder: There are multiple gallstones measuring up to 11 mm. There is no gallbladder wall thickening or pericholecystic fluid. No positive sonographic Percell Miller sign was demonstrated. Common bile duct: Diameter: 3.7 mm Liver: No focal lesion identified. Within normal limits in parenchymal echogenicity. IVC: No abnormality visualized. Pancreas: Visualized portion unremarkable. Spleen: Size and appearance within normal limits. Right Kidney: Length: 11.3 cm. Echogenicity within normal limits. No mass or hydronephrosis visualized. Left Kidney: Length: 11.2 cm. Echogenicity within normal limits. No mass or hydronephrosis  visualized. Abdominal aorta: No aneurysm visualized. Other findings: None. IMPRESSION: Cholelithiasis without other evidence of acute cholecystitis. Otherwise normal abdominal ultrasound. Electronically Signed   By: Ulyses Jarred M.D.   On: 04/11/2017 02:20    Procedures Procedures (including critical care time)  Medications Ordered in ED Medications  famotidine (PEPCID) IVPB 20 mg premix (20 mg Intravenous New Bag/Given 04/11/17 0030)  ketorolac (TORADOL) 30 MG/ML injection 30 mg (30 mg Intravenous Given 04/11/17 0034)  sodium chloride 0.9 % bolus 1,000 mL (1,000 mLs Intravenous New Bag/Given 04/11/17 0030)  ondansetron (ZOFRAN) injection 4 mg (4 mg Intravenous Given 04/11/17 0030)     Initial Impression / Assessment and Plan / ED Course  I have reviewed the triage vital signs and the nursing notes.  Pertinent labs & imaging results that were available during my care of the patient were reviewed by me and considered in my medical decision  making (see chart for details).     31 year old female presents for abdominal pain with associated nausea. She was noted to have focal tenderness in her right upper quadrant. This correlates with cholelithiasis seen on ultrasound. Laboratory workup reassuring. No leukocytosis or transaminitis. Pain has subsided following Toradol and Pepcid. Nausea improved with Zofran. On reassessment, patient states that she is feeling much better; denies pain.   Symptoms consistent with biliary colic. Plan to refer to general surgery for outpatient follow-up. Patient advised to avoid fatty and fried foods. Return precautions discussed and provided. Patient discharged in stable condition with no unaddressed concerns.   Final Clinical Impressions(s) / ED Diagnoses   Final diagnoses:  Calculus of gallbladder without cholecystitis without obstruction  Biliary colic    New Prescriptions New Prescriptions   HYDROCODONE-ACETAMINOPHEN (NORCO/VICODIN) 5-325 MG TABLET     Take 1-2 tablets by mouth every 8 (eight) hours as needed for severe pain.     Antonietta Breach, PA-C 04/11/17 [place], [place], DO 04/11/17 [phone]

## 2017-06-04 ENCOUNTER — Other Ambulatory Visit: Payer: Self-pay | Admitting: General Surgery

## 2017-12-01 ENCOUNTER — Emergency Department (HOSPITAL_COMMUNITY)
Admission: EM | Admit: 2017-12-01 | Discharge: 2017-12-01 | Disposition: A | Discharge status: Home / Self Care | Payer: 59 | Attending: Physician Assistant | Admitting: Physician Assistant

## 2017-12-01 ENCOUNTER — Encounter (HOSPITAL_COMMUNITY): Payer: Self-pay | Admitting: Emergency Medicine

## 2017-12-01 ENCOUNTER — Other Ambulatory Visit: Payer: Self-pay

## 2017-12-01 DIAGNOSIS — H538 Other visual disturbances: Secondary | ICD-10-CM | POA: Insufficient documentation

## 2017-12-01 DIAGNOSIS — R110 Nausea: Secondary | ICD-10-CM | POA: Diagnosis not present

## 2017-12-01 DIAGNOSIS — G43109 Migraine with aura, not intractable, without status migrainosus: Secondary | ICD-10-CM

## 2017-12-01 LAB — I-STAT BETA HCG BLOOD, ED (MC, WL, AP ONLY): I-stat hCG, quantitative: <5 m[IU]/mL (ref <5)

## 2017-12-01 MED ORDER — SODIUM CHLORIDE 0.9 % IV BOLUS (SEPSIS)
1000.0000 mL | Freq: Once | INTRAVENOUS | Status: AC
  Administered 2017-12-01: 1000 mL via INTRAVENOUS

## 2017-12-01 MED ORDER — KETOROLAC TROMETHAMINE 30 MG/ML IJ SOLN
30.0000 mg | Freq: Once | INTRAMUSCULAR | Status: AC
  Administered 2017-12-01: 30 mg via INTRAVENOUS
  Filled 2017-12-01: qty 1

## 2017-12-01 MED ORDER — PROCHLORPERAZINE EDISYLATE 5 MG/ML IJ SOLN
10.0000 mg | Freq: Once | INTRAMUSCULAR | Status: AC
  Administered 2017-12-01: 10 mg via INTRAVENOUS
  Filled 2017-12-01: qty 2

## 2017-12-01 MED ORDER — DIPHENHYDRAMINE HCL 50 MG/ML IJ SOLN
25.0000 mg | Freq: Once | INTRAMUSCULAR | Status: AC
  Administered 2017-12-01: 25 mg via INTRAVENOUS
  Filled 2017-12-01: qty 1

## 2017-12-01 NOTE — Discharge Instructions (Signed)
We are sorry about your bad migraine today.  We are glad you are feeling better.  We do recommend you follow-up with a migraine specialist, these are called neurologist.  We have given you the name above and this is the name of the doctor who works at [facility].  Please return with any concerns.

## 2017-12-01 NOTE — ED Provider Notes (Signed)
[facility] DEPT Provider Note   CSN: [id number] Arrival date & time: 12/01/17  0920     History   Chief Complaint Chief Complaint  Patient presents with  . Migraine    HPI [name] is a 32 y.o. female.  HPI   32 year old female presenting with migraine.  Patient has history of the same.  Reports that has had some since childhood.  Patient appears uncomfortable on arrival, nauseated.  Patient reports light sensitivity.  Reports is been going on since Monday.  She denies any current daily medications to help avoid headaches.  Has not seen a neurologist in the past.  Past Medical History:  Diagnosis Date  . Chicken pox   . Headaches   . Hx of migraines   . Hx of varicella     Patient Active Problem List   Diagnosis Date Noted  . Gestational diabetes 10/29/2016  . Paresthesia of both lower extremities 10/29/2016    Past Surgical History:  Procedure Laterality Date  . CESAREAN SECTION  2003 & 2006  . CESAREAN SECTION N/A 04/30/2014   Procedure: REPEAT CESAREAN SECTION;  Surgeon: Allyn Kenner, DO;  Location: [facility];  Service: Obstetrics;  Laterality: N/A;  . CESAREAN SECTION N/A 05/15/2016   Procedure: CESAREAN SECTION;  Surgeon: Allyn Kenner, DO;  Location: [facility];  Service: Obstetrics;  Laterality: N/A;  . TUBAL LIGATION Bilateral 05/15/2016   Procedure: BILATERAL TUBAL LIGATION;  Surgeon: Allyn Kenner, DO;  Location: [facility];  Service: Obstetrics;  Laterality: Bilateral;    OB History    Gravida Para Term Preterm AB Living   4 4 2     4    SAB TAB Ectopic Multiple Live Births         0 2       Home Medications    Prior to Admission medications   Medication Sig Start Date End Date Taking? Authorizing Provider  HYDROcodone-acetaminophen (NORCO/VICODIN) 5-325 MG tablet Take 1-2 tablets by mouth every 8 (eight) hours as needed for severe pain. 04/11/17   Antonietta Breach, PA-C    Family History Family  History  Problem Relation Age of Onset  . Diabetes Mother   . Hypertension Mother   . Alcohol abuse Mother   . Hyperlipidemia Mother   . Cancer Mother 29       breast cancer  . Hyperlipidemia Other   . Obesity Other   . Diabetes Maternal Aunt   . Hypertension Maternal Aunt   . Kidney disease Maternal Aunt   . Diabetes Paternal Aunt   . Hypertension Paternal Aunt   . Diabetes Maternal Grandmother   . Heart disease Maternal Grandmother   . Diabetes Paternal Grandfather   . Hypertension Maternal Grandfather     Social History Social History   Tobacco Use  . Smoking status: Never Smoker  . Smokeless tobacco: Never Used  Substance Use Topics  . Alcohol use: No  . Drug use: No     Allergies   Patient has no known allergies.   Review of Systems Review of Systems  Constitutional: Negative for fatigue.  Respiratory: Negative for cough.   Gastrointestinal: Negative for abdominal distention.  Neurological: Positive for headaches.  Psychiatric/Behavioral: Negative for agitation.  All other systems reviewed and are negative.    Physical Exam Updated Vital Signs BP (!) 166/125 (BP Location: Right Arm)   Pulse 90   Temp 98 F (36.7 C) (Oral)   Resp (!) 22   SpO2 100%  Physical Exam  Constitutional: She is oriented to person, place, and time. She appears well-developed and well-nourished.  Pt is uncomfortable.   HENT:  Head: Normocephalic and atraumatic.  Eyes: Right eye exhibits no discharge. Left eye exhibits no discharge.  Cardiovascular: Normal rate and regular rhythm.  Pulmonary/Chest: Effort normal and breath sounds normal.  Neurological: She is oriented to person, place, and time.  Skin: Skin is warm and dry. She is not diaphoretic.  Nursing note and vitals reviewed.    ED Treatments / Results  Labs (all labs ordered are listed, but only abnormal results are displayed) Labs Reviewed  I-STAT BETA HCG BLOOD, ED ([facility], [facility], [facility] ONLY)    EKG  EKG  Interpretation None       Radiology No results found.  Procedures Procedures (including critical care time)  Medications Ordered in ED Medications  sodium chloride 0.9 % bolus 1,000 mL (not administered)  prochlorperazine (COMPAZINE) injection 10 mg (not administered)  diphenhydrAMINE (BENADRYL) injection 25 mg (not administered)  ketorolac (TORADOL) 30 MG/ML injection 30 mg (not administered)     Initial Impression / Assessment and Plan / ED Course  I have reviewed the triage vital signs and the nursing notes.  Pertinent labs & imaging results that were available during my care of the patient were reviewed by me and considered in my medical decision making (see chart for details).     32 year old female presenting with migraine.  Patient has history of the same.  Reports that has had some since childhood.  Patient appears uncomfortable on arrival, nauseated.  Patient reports light sensitivity.  Reports is been going on since Monday.  She denies any current daily medications to help avoid headaches.  Has not seen a neurologist in the past.  11:45 AM Will treat for migraine.  Patient family inquiring about the  [facility].  Will give them referral information.  Final Clinical Impressions(s) / ED Diagnoses   Final diagnoses:  None    ED Discharge Orders    None       Davante Gerke, [name] Sorrow, MD 12/01/17 1420

## 2017-12-01 NOTE — ED Triage Notes (Signed)
Pt verbalizes migraine worsening since Monday with associated nausea and light sensitivity; hx of same.

## 2019-05-17 ENCOUNTER — Other Ambulatory Visit: Payer: Self-pay

## 2019-05-18 ENCOUNTER — Encounter: Payer: Self-pay | Admitting: Nurse Practitioner

## 2019-05-18 ENCOUNTER — Other Ambulatory Visit: Payer: Self-pay

## 2019-05-18 ENCOUNTER — Ambulatory Visit (INDEPENDENT_AMBULATORY_CARE_PROVIDER_SITE_OTHER): Payer: 59 | Admitting: Nurse Practitioner

## 2019-05-18 VITALS — BP 132/80 | HR 93 | Temp 97.7°F | Ht 66.0 in | Wt 259.4 lb

## 2019-05-18 DIAGNOSIS — Z6841 Body Mass Index (BMI) 40.0 and over, adult: Secondary | ICD-10-CM

## 2019-05-18 DIAGNOSIS — R7301 Impaired fasting glucose: Secondary | ICD-10-CM | POA: Diagnosis not present

## 2019-05-18 DIAGNOSIS — G43109 Migraine with aura, not intractable, without status migrainosus: Secondary | ICD-10-CM

## 2019-05-18 DIAGNOSIS — Z9851 Tubal ligation status: Secondary | ICD-10-CM | POA: Insufficient documentation

## 2019-05-18 DIAGNOSIS — H53453 Other localized visual field defect, bilateral: Secondary | ICD-10-CM | POA: Insufficient documentation

## 2019-05-18 DIAGNOSIS — E66813 Obesity, class 3: Secondary | ICD-10-CM

## 2019-05-18 DIAGNOSIS — Z98891 History of uterine scar from previous surgery: Secondary | ICD-10-CM | POA: Insufficient documentation

## 2019-05-18 DIAGNOSIS — R7989 Other specified abnormal findings of blood chemistry: Secondary | ICD-10-CM

## 2019-05-18 DIAGNOSIS — Z8632 Personal history of gestational diabetes: Secondary | ICD-10-CM | POA: Insufficient documentation

## 2019-05-18 DIAGNOSIS — K6289 Other specified diseases of anus and rectum: Secondary | ICD-10-CM | POA: Insufficient documentation

## 2019-05-18 DIAGNOSIS — G5600 Carpal tunnel syndrome, unspecified upper limb: Secondary | ICD-10-CM | POA: Insufficient documentation

## 2019-05-18 DIAGNOSIS — F339 Major depressive disorder, recurrent, unspecified: Secondary | ICD-10-CM | POA: Insufficient documentation

## 2019-05-18 DIAGNOSIS — F322 Major depressive disorder, single episode, severe without psychotic features: Secondary | ICD-10-CM | POA: Diagnosis not present

## 2019-05-18 LAB — CBC WITH DIFFERENTIAL/PLATELET
Basophils Absolute: 0.1 10*3/uL (ref 0.0–0.1)
Basophils Relative: 0.8 % (ref 0.0–3.0)
Eosinophils Absolute: 0.2 10*3/uL (ref 0.0–0.7)
Eosinophils Relative: 2.8 % (ref 0.0–5.0)
HCT: 38.5 % (ref 36.0–46.0)
Hemoglobin: 12.4 g/dL (ref 12.0–15.0)
Lymphocytes Relative: 25.3 % (ref 12.0–46.0)
Lymphs Abs: 1.6 10*3/uL (ref 0.7–4.0)
MCHC: 32.2 g/dL (ref 30.0–36.0)
MCV: 79.1 fl (ref 78.0–100.0)
Monocytes Absolute: 0.5 10*3/uL (ref 0.1–1.0)
Monocytes Relative: 8.4 % (ref 3.0–12.0)
Neutro Abs: 3.9 10*3/uL (ref 1.4–7.7)
Neutrophils Relative %: 62.7 % (ref 43.0–77.0)
Platelets: 265 10*3/uL (ref 150.0–400.0)
RBC: 4.86 Mil/uL (ref 3.87–5.11)
RDW: 13.5 % (ref 11.5–15.5)
WBC: 6.3 10*3/uL (ref 4.0–10.5)

## 2019-05-18 LAB — T3, FREE: T3, Free: 3.2 pg/mL (ref 2.3–4.2)

## 2019-05-18 LAB — COMPREHENSIVE METABOLIC PANEL
ALT: 16 U/L (ref 0–35)
AST: 16 U/L (ref 0–37)
Albumin: 4.3 g/dL (ref 3.5–5.2)
Alkaline Phosphatase: 49 U/L (ref 39–117)
BUN: 12 mg/dL (ref 6–23)
CO2: 28 mEq/L (ref 19–32)
Calcium: 9.4 mg/dL (ref 8.4–10.5)
Chloride: 104 mEq/L (ref 96–112)
Creatinine, Ser: 0.84 mg/dL (ref 0.40–1.20)
GFR: 93.85 mL/min (ref >60.00)
Glucose, Bld: 93 mg/dL (ref 70–99)
Potassium: 4.3 mEq/L (ref 3.5–5.1)
Sodium: 139 mEq/L (ref 135–145)
Total Bilirubin: 0.4 mg/dL (ref 0.2–1.2)
Total Protein: 7.1 g/dL (ref 6.0–8.3)

## 2019-05-18 LAB — TSH: TSH: 3.28 u[IU]/mL (ref 0.35–4.50)

## 2019-05-18 LAB — HEMOGLOBIN A1C: Hgb A1c MFr Bld: 6 % (ref 4.6–6.5)

## 2019-05-18 LAB — T4, FREE: Free T4: 0.73 ng/dL (ref 0.60–1.60)

## 2019-05-18 MED ORDER — BUTALBITAL-APAP-CAFFEINE 50-325-40 MG PO TABS: 1.0000 | ORAL_TABLET | ORAL | 0 refills | Status: DC | PRN

## 2019-05-18 MED ORDER — AMITRIPTYLINE HCL 25 MG PO TABS: 25.0000 mg | ORAL_TABLET | Freq: Every day | ORAL | 2 refills | Status: DC

## 2019-05-18 NOTE — Assessment & Plan Note (Addendum)
Aura: Abnormal peripheral vision, and numbness in finger, dizziness, difficulty talking No syncope or LOC, no confuusion. No improvement with ibuprofen. 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 CT head ordered Entered referral to neurology. F/up in 2weeks  Consider use of topamax?

## 2019-05-18 NOTE — Patient Instructions (Addendum)
Go to lab for blood draw.  You will be contacted to schedule appt with psychologist and neurology.  Start amitriptyline at bedtime. Use fioricet as needed for migraine.   Suicidal Feelings: How to Help Yourself Suicide is when you end your own life. There are many things you can do to help yourself feel better when struggling with these feelings. Many services and people are available to support you and others who struggle with similar feelings.  If you ever feel like you may hurt yourself or others, or have thoughts about taking your own life, get help right away. To get help:  Call your local emergency services (911 in the [place]  [facility] [place] [facility]'s health and human services helpline (211 in the [place]  Go to your nearest emergency department.  Call a suicide hotline to speak with a trained counselor. The following suicide hotlines are available in the [place]: ? 1-800-273-TALK 2264418513). ? 1-800-SUICIDE 531-423-5587). ? 872-845-0499. This is a hotline for Spanish speakers. ? 437 020 5492. This is a hotline for TTY users. ? 1-866-4-U-TREVOR 954-541-1454). This is a hotline for lesbian, gay, bisexual, transgender, or questioning youth. ? For a list of hotlines in [place], visit ParkingAffiliatePrograms.se.html  Contact a crisis center or a local suicide prevention center. To find a crisis center or suicide prevention center: ? Call your local hospital, clinic, community service organization, mental health center, social service provider, or health department. Ask for help with connecting to a crisis center. ? For a list of crisis centers in the [place], visit: suicidepreventionlifeline.org ? For a list of crisis centers in [place], visit: suicideprevention.ca How to help yourself feel better   Promise yourself that you will not do anything extreme when you have suicidal feelings. Remember, there is hope. Many people have  gotten through suicidal thoughts and feelings, and you can too. If you have had these feelings before, remind yourself that you can get through them again.  Let family, friends, teachers, or counselors know how you are feeling. Try not to separate yourself from those who care about you and want to help you. Talk with someone every day, even if you do not feel sociable. Face-to-face conversation is best to help them understand your feelings.  Contact a mental health care provider and work with this person regularly.  Make a safety plan that you can follow during a crisis. Include phone numbers of suicide prevention hotlines, mental health professionals, and trusted friends and family members you can call during an emergency. Save these numbers on your phone.  If you are thinking of taking a lot of medicine, give your medicine to someone who can give it to you as prescribed. If you are on antidepressants and are concerned you will overdose, tell your health care provider so that he or she can give you safer medicines.  Try to stick to your routines. Follow a schedule every day. Make self-care a priority.  Make a list of realistic goals, and cross them off when you achieve them. Accomplishments can give you a sense of worth.  Wait until you are feeling better before doing things that you find difficult or unpleasant.  Do things that you have always enjoyed to take your mind off your feelings. Try reading a book, or listening to or playing music. Spending time outside, in nature, may help you feel better. Follow these instructions at home:   Visit your primary health care provider every year for a checkup.  Work with a mental health care  provider as needed.  Eat a well-balanced diet, and eat regular meals.  Get plenty of rest.  Exercise if you are able. Just 30 minutes of exercise each day can help you feel better.  Take over-the-counter and prescription medicines only as told by your  health care provider. Ask your mental health care provider about the possible side effects of any medicines you are taking.  Do not use alcohol or drugs, and remove these substances from your home.  Remove weapons, poisons, knives, and other deadly items from your home. General recommendations  Keep your living space well lit.  When you are feeling well, write yourself a letter with tips and support that you can read when you are not feeling well.  Remember that life's difficulties can be sorted out with help. Conditions can be treated, and you can learn behaviors and ways of thinking that will help you. Where to find more information  National Suicide Prevention Lifeline: www.suicidepreventionlifeline.org  Hopeline: www.hopeline.[place] for Suicide Prevention: PromotionalLoans.co.za  [employer] (for lesbian, gay, bisexual, transgender, or questioning youth): www.thetrevorproject.org Contact a health care provider if:  You feel as though you are a burden to others.  You feel agitated, angry, vengeful, or have extreme mood swings.  You have withdrawn from family and friends. Get help right away if:  You are talking about suicide or wishing to die.  You start making plans for how to commit suicide.  You feel that you have no reason to live.  You start making plans for putting your affairs in order, saying goodbye, or giving your possessions away.  You feel guilt, shame, or unbearable pain, and it seems like there is no way out.  You are frequently using drugs or alcohol.  You are engaging in risky behaviors that could lead to death. If you have any of these symptoms, get help right away. Call emergency services, go to your nearest emergency department or crisis center, or call a suicide crisis helpline. Summary  Suicide is when you take your own life.  Promise yourself that you will not do anything extreme when you have suicidal feelings.  Let family,  friends, teachers, or counselors know how you are feeling.  Get help right away if you feel as though life is getting too tough to handle and you are thinking about suicide. This information is not intended to replace advice given to you by your health care provider. Make sure you discuss any questions you have with your health care provider. Document Released: 03/14/2003 Document Revised: 12/29/2018 Document Reviewed: 04/20/2017 Elsevier Patient Education  2020 [employer].
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
## 2019-05-26 ENCOUNTER — Ambulatory Visit
Admission: RE | Admit: 2019-05-26 | Discharge: 2019-05-26 | Disposition: A | Discharge status: Home / Self Care | Payer: 59 | Source: Ambulatory Visit | Attending: Nurse Practitioner | Admitting: Nurse Practitioner

## 2019-05-26 DIAGNOSIS — G43109 Migraine with aura, not intractable, without status migrainosus: Secondary | ICD-10-CM

## 2019-05-26 DIAGNOSIS — H53453 Other localized visual field defect, bilateral: Secondary | ICD-10-CM

## 2019-06-01 ENCOUNTER — Telehealth: Payer: Self-pay | Admitting: Nurse Practitioner

## 2019-06-01 NOTE — Telephone Encounter (Signed)

## 2019-06-02 ENCOUNTER — Other Ambulatory Visit: Payer: Self-pay | Admitting: Nurse Practitioner

## 2019-06-02 ENCOUNTER — Other Ambulatory Visit: Payer: Self-pay

## 2019-06-02 ENCOUNTER — Encounter: Payer: Self-pay | Admitting: Nurse Practitioner

## 2019-06-02 ENCOUNTER — Ambulatory Visit (INDEPENDENT_AMBULATORY_CARE_PROVIDER_SITE_OTHER): Payer: 59 | Admitting: Nurse Practitioner

## 2019-06-02 VITALS — BP 126/82 | HR 103 | Temp 98.3°F | Ht 66.0 in | Wt 263.6 lb

## 2019-06-02 DIAGNOSIS — F322 Major depressive disorder, single episode, severe without psychotic features: Secondary | ICD-10-CM | POA: Diagnosis not present

## 2019-06-02 DIAGNOSIS — G43109 Migraine with aura, not intractable, without status migrainosus: Secondary | ICD-10-CM

## 2019-06-02 DIAGNOSIS — Z23 Encounter for immunization: Secondary | ICD-10-CM

## 2019-06-02 MED ORDER — FLUOXETINE HCL 20 MG PO TABS: 20.0000 mg | ORAL_TABLET | Freq: Every day | ORAL | 3 refills | Status: DC

## 2019-06-02 NOTE — Patient Instructions (Signed)
Stop elavil due to daytime somnolence. Start fluoxetine.  Maintain upcoming appt with neurology and therapist. Use fioricet as needed for headache.   Living With Depression Everyone experiences occasional disappointment, sadness, and loss in their lives. When you are feeling down, blue, or sad for at least 2 weeks in a row, it may mean that you have depression. Depression can affect your thoughts and feelings, relationships, daily activities, and physical health. It is caused by changes in the way your brain functions. If you receive a diagnosis of depression, your health care provider will tell you which type of depression you have and what treatment options are available to you. If you are living with depression, there are ways to help you recover from it and also ways to prevent it from coming back. How to cope with lifestyle changes Coping with stress     Stress is your body's reaction to life changes and events, both good and bad. Stressful situations may include:  Getting married.  The death of a spouse.  Losing a job.  Retiring.  Having a baby. Stress can last just a few hours or it can be ongoing. Stress can play a major role in depression, so it is important to learn both how to cope with stress and how to think about it differently. Talk with your health care provider or a counselor if you would like to learn more about stress reduction. He or she may suggest some stress reduction techniques, such as:  Music therapy. This can include creating music or listening to music. Choose music that you enjoy and that inspires you.  Mindfulness-based meditation. This kind of meditation can be done while sitting or walking. It involves being aware of your normal breaths, rather than trying to control your breathing.  Centering prayer. This is a kind of meditation that involves focusing on a spiritual word or phrase. Choose a word, phrase, or sacred image that is meaningful to you and  that brings you peace.  Deep breathing. To do this, expand your stomach and inhale slowly through your nose. Hold your breath for 3-5 seconds, then exhale slowly, allowing your stomach muscles to relax.  Muscle relaxation. This involves intentionally tensing muscles then relaxing them. Choose a stress reduction technique that fits your lifestyle and personality. Stress reduction techniques take time and practice to develop. Set aside 5-15 minutes a day to do them. Therapists can offer training in these techniques. The training may be covered by some insurance plans. Other things you can do to manage stress include:  Keeping a stress diary. This can help you learn what triggers your stress and ways to control your response.  Understanding what your limits are and saying no to requests or events that lead to a schedule that is too full.  Thinking about how you respond to certain situations. You may not be able to control everything, but you can control how you react.  Adding humor to your life by watching funny films or TV shows.  Making time for activities that help you relax and not feeling guilty about spending your time this way.  Medicines Your health care provider may suggest certain medicines if he or she feels that they will help improve your condition. Avoid using alcohol and other substances that may prevent your medicines from working properly (may interact). It is also important to:  Talk with your pharmacist or health care provider about all the medicines that you take, their possible side effects, and what medicines  are safe to take together.  Make it your goal to take part in all treatment decisions (shared decision-making). This includes giving input on the side effects of medicines. It is best if shared decision-making with your health care provider is part of your total treatment plan. If your health care provider prescribes a medicine, you may not notice the full benefits of  it for 4-8 weeks. Most people who are treated for depression need to be on medicine for at least 6-12 months after they feel better. If you are taking medicines as part of your treatment, do not stop taking medicines without first talking to your health care provider. You may need to have the medicine slowly decreased (tapered) over time to decrease the risk of harmful side effects. Relationships Your health care provider may suggest family therapy along with individual therapy and drug therapy. While there may not be family problems that are causing you to feel depressed, it is still important to make sure your family learns as much as they can about your mental health. Having your family's support can help make your treatment successful. How to recognize changes in your condition Everyone has a different response to treatment for depression. Recovery from major depression happens when you have not had signs of major depression for two months. This may mean that you will start to:  Have more interest in doing activities.  Feel less hopeless than you did 2 months ago.  Have more energy.  Overeat less often, or have better or improving appetite.  Have better concentration. Your health care provider will work with you to decide the next steps in your recovery. It is also important to recognize when your condition is getting worse. Watch for these signs:  Having fatigue or low energy.  Eating too much or too little.  Sleeping too much or too little.  Feeling restless, agitated, or hopeless.  Having trouble concentrating or making decisions.  Having unexplained physical complaints.  Feeling irritable, angry, or aggressive. Get help as soon as you or your family members notice these symptoms coming back. How to get support and help from others How to talk with friends and family members about your condition  Talking to friends and family members about your condition can provide you with  one way to get support and guidance. Reach out to trusted friends or family members, explain your symptoms to them, and let them know that you are working with a health care provider to treat your depression. Financial resources Not all insurance plans cover mental health care, so it is important to check with your insurance carrier. If paying for co-pays or counseling services is a problem, search for a local or county mental health care center. They may be able to offer public mental health care services at low or no cost when you are not able to see a private health care provider. If you are taking medicine for depression, you may be able to get the generic form, which may be less expensive. Some makers of prescription medicines also offer help to patients who cannot afford the medicines they need. Follow these instructions at home:   Get the right amount and quality of sleep.  Cut down on using caffeine, tobacco, alcohol, and other potentially harmful substances.  Try to exercise, such as walking or lifting small weights.  Take over-the-counter and prescription medicines only as told by your health care provider.  Eat a healthy diet that includes plenty of vegetables, fruits, whole  grains, low-fat dairy products, and lean protein. Do not eat a lot of foods that are high in solid fats, added sugars, or salt.  Keep all follow-up visits as told by your health care provider. This is important. Contact a health care provider if:  You stop taking your antidepressant medicines, and you have any of these symptoms: ? Nausea. ? Headache. ? Feeling lightheaded. ? Chills and body aches. ? Not being able to sleep (insomnia).  You or your friends and family think your depression is getting worse. Get help right away if:  You have thoughts of hurting yourself or others. If you ever feel like you may hurt yourself or others, or have thoughts about taking your own life, get help right away. You can  go to your nearest emergency department or call:  Your local emergency services (911 in the [place]  A suicide crisis helpline, such as the [facility] at 9297728247. This is open 24-hours a day. Summary  If you are living with depression, there are ways to help you recover from it and also ways to prevent it from coming back.  Work with your health care team to create a management plan that includes counseling, stress management techniques, and healthy lifestyle habits. This information is not intended to replace advice given to you by your health care provider. Make sure you discuss any questions you have with your health care provider. Document Released: 08/10/2016 Document Revised: 12/30/2018 Document Reviewed: 08/10/2016 Elsevier Patient Education  2020 [employer].

## 2019-06-02 NOTE — Progress Notes (Signed)
Subjective:  Patient ID: [name], female    DOB: 11-Aug-1986  Age: 33 y.o. MRN: [id number]  CC: Follow-up (follow up on depression and migrains--getting better--depression is not as bad--cry 2 days last week. elavil is making her sleepy mostly  )  HPI  Depression: Stable mood with elavil. No SI or HI Reports increased daytime somnolence with elavil, only improves after 12noon. appt with therapist 06/16/2019  Migraine headache: Improved (intensity and duration) with fioricet. Denies any adverse reaction. upcoming appt with neurology: 07/06/2019.  Reviewed past Medical, Social and Family history today.  Outpatient Medications Prior to Visit  Medication Sig Dispense Refill  . acetaminophen (TYLENOL) 500 MG tablet Take 1,000 mg by mouth daily as needed for headache.    . butalbital-acetaminophen-caffeine (FIORICET) 50-325-40 MG tablet Take 1 tablet by mouth every 4 (four) hours as needed for headache. 20 tablet 0  . amitriptyline (ELAVIL) 25 MG tablet Take 1 tablet (25 mg total) by mouth at bedtime. 30 tablet 2   No facility-administered medications prior to visit.     ROS See HPI  Objective:  BP 126/82   Pulse (!) 103   Temp 98.3 F (36.8 C) (Tympanic)   Ht 5\' 6"  (1.676 m)   Wt 263 lb 9.6 oz (119.6 kg)   LMP 05/21/2019 (Within Days)   SpO2 98%   BMI 42.55 kg/m   BP Readings from Last 3 Encounters:  06/02/19 126/82  05/18/19 132/80  12/01/17 121/60    Wt Readings from Last 3 Encounters:  06/02/19 263 lb 9.6 oz (119.6 kg)  05/18/19 259 lb 6.4 oz (117.7 kg)  04/10/17 223 lb (101.2 kg)   Physical Exam Psychiatric:        Attention and Perception: Attention normal.        Mood and Affect: Affect is flat.        Speech: Speech normal.        Behavior: Behavior normal. Behavior is cooperative.        Thought Content: Thought content is not paranoid or delusional. Thought content does not include homicidal or suicidal ideation. Thought content does not include  homicidal or suicidal plan.        Cognition and Memory: Cognition normal.        Judgment: Judgment normal.    Lab Results  Component Value Date   WBC 6.3 05/18/2019   HGB 12.4 05/18/2019   HCT 38.5 05/18/2019   PLT 265.0 05/18/2019   GLUCOSE 93 05/18/2019   ALT 16 05/18/2019   AST 16 05/18/2019   NA 139 05/18/2019   K 4.3 05/18/2019   CL 104 05/18/2019   CREATININE 0.84 05/18/2019   BUN 12 05/18/2019   CO2 28 05/18/2019   TSH 3.28 05/18/2019   HGBA1C 6.0 05/18/2019   MICROALBUR 0.8 10/29/2016    Ct Head Wo Contrast  Result Date: 05/26/2019 CLINICAL DATA:  Severe headache and blurry vision. History of migraines. EXAM: CT HEAD WITHOUT CONTRAST TECHNIQUE: Contiguous axial images were obtained from the base of the skull through the vertex without intravenous contrast. COMPARISON:  None. FINDINGS: Brain: No evidence of acute infarction, hemorrhage, hydrocephalus, extra-axial collection or mass lesion/mass effect. Vascular: No hyperdense vessel or unexpected calcification. Skull: Normal. Negative for fracture or focal lesion. Sinuses/Orbits: No acute finding. Other: None. IMPRESSION: 1. Normal noncontrast head CT. Electronically Signed   By: Titus Dubin M.D.   On: 05/26/2019 15:02    Assessment & Plan:   Jamyra was seen today for follow-up.  Diagnoses and all orders for this visit:  Depression, major, single episode, severe (HCC) -     FLUoxetine (PROZAC) 20 MG tablet; Take 1 tablet (20 mg total) by mouth daily.  Need for immunization against influenza -     Flu Vaccine QUAD 36+ mos IM   I have discontinued Temple Koplin's amitriptyline. I am also having her start on FLUoxetine. Additionally, I am having her maintain her acetaminophen and butalbital-acetaminophen-caffeine.  Meds ordered this encounter  Medications  . FLUoxetine (PROZAC) 20 MG tablet    Sig: Take 1 tablet (20 mg total) by mouth daily.    Dispense:  30 tablet    Refill:  3    Discontinue elavil    Order  Specific Question:   Supervising Provider    Answer:   MATTHEWS, CODY [[id number]]    Problem List Items Addressed This Visit      Other   Depression, major, single episode, severe ([place]) - Primary   Relevant Medications   FLUoxetine (PROZAC) 20 MG tablet    Other Visit Diagnoses    Need for immunization against influenza       Relevant Orders   Flu Vaccine QUAD 36+ mos IM (Completed)      Follow-up: Return in about 4 weeks (around 06/30/2019) for depression (repeat PHQ and GAD, 37mins).  Wilfred Lacy, NP

## 2019-06-12 ENCOUNTER — Ambulatory Visit (INDEPENDENT_AMBULATORY_CARE_PROVIDER_SITE_OTHER): Payer: 59 | Admitting: Psychology

## 2019-06-12 DIAGNOSIS — F322 Major depressive disorder, single episode, severe without psychotic features: Secondary | ICD-10-CM

## 2019-06-30 ENCOUNTER — Other Ambulatory Visit: Payer: Self-pay | Admitting: Nurse Practitioner

## 2019-06-30 ENCOUNTER — Encounter: Payer: Self-pay | Admitting: Nurse Practitioner

## 2019-06-30 ENCOUNTER — Ambulatory Visit (INDEPENDENT_AMBULATORY_CARE_PROVIDER_SITE_OTHER): Payer: 59 | Admitting: Nurse Practitioner

## 2019-06-30 ENCOUNTER — Other Ambulatory Visit: Payer: Self-pay

## 2019-06-30 VITALS — BP 110/70 | HR 90 | Temp 97.7°F | Ht 66.0 in | Wt 254.8 lb

## 2019-06-30 DIAGNOSIS — F322 Major depressive disorder, single episode, severe without psychotic features: Secondary | ICD-10-CM

## 2019-06-30 DIAGNOSIS — G43109 Migraine with aura, not intractable, without status migrainosus: Secondary | ICD-10-CM

## 2019-06-30 MED ORDER — TOPIRAMATE 25 MG PO TABS
ORAL_TABLET | ORAL | 1 refills | Status: DC
Start: 2019-06-30 — End: 2019-10-17

## 2019-06-30 MED ORDER — FLUOXETINE HCL 20 MG PO TABS: 20.0000 mg | ORAL_TABLET | Freq: Every day | ORAL | 1 refills | Status: DC

## 2019-06-30 MED ORDER — BUTALBITAL-APAP-CAFFEINE 50-325-40 MG PO TABS: 1.0000 | ORAL_TABLET | ORAL | 0 refills | Status: DC | PRN

## 2019-06-30 NOTE — Telephone Encounter (Signed)
LVM for the pt to call back, need to know reason of request changing med?

## 2019-06-30 NOTE — Assessment & Plan Note (Signed)
Worsening frequency with discontinuation of elavil. Elavil was discontinued due to daytime somnolence even as a decreased dose. Use of fioricet 2x/week with minimal improvement.  Slowly Titrate topamax dose to 25mg  BID Maintain appt with neurology 07/06/2019

## 2019-06-30 NOTE — Assessment & Plan Note (Signed)
improved with fluoxetine and therapy sessions. Denies any adverse side effects with fluoxetine Upcoming second therapy session.  Maintain fluoxetine dose and f/up in 25month.  Depression screen [facility] 2/9 06/30/2019 05/18/2019 10/29/2016  Decreased Interest 1 3 0  Down, Depressed, Hopeless 1 2 0  PHQ - 2 Score 2 5 0  Altered sleeping 2 3 -  Tired, decreased energy 1 2 -  Change in appetite 2 2 -  Feeling bad or failure about yourself  1 2 -  Trouble concentrating 1 3 -  Moving slowly or fidgety/restless 0 3 -  Suicidal thoughts 0 1 -  PHQ-9 Score 9 21 -   GAD 7 : Generalized Anxiety Score 06/30/2019 05/18/2019  Nervous, Anxious, on Edge 2 3  Control/stop worrying 1 3  Worry too much - different things 1 2  Trouble relaxing 1 2  Restless 0 1  Easily annoyed or irritable 1 3  Afraid - awful might happen 1 2  Total GAD 7 Score 7 16

## 2019-06-30 NOTE — Patient Instructions (Addendum)
Start topamax: titrate dose to BID. Follow direction on bottle. Continue fluoxetine.  Use fioricet as needed.  Topiramate tablets What is this medicine? TOPIRAMATE (toe PYRE a mate) is used to treat seizures in adults or children with epilepsy. It is also used for the prevention of migraine headaches. This medicine may be used for other purposes; ask your health care provider or pharmacist if you have questions. COMMON BRAND NAME(S): Topamax, Topiragen What should I tell my health care provider before I take this medicine? They need to know if you have any of these conditions:  bleeding disorders  cirrhosis of the liver or liver disease  diarrhea  glaucoma  kidney stones or kidney disease  low blood counts, like low white cell, platelet, or red cell counts  lung disease like asthma, obstructive pulmonary disease, emphysema  metabolic acidosis  on a ketogenic diet  schedule for surgery or a procedure  suicidal thoughts, plans, or attempt; a previous suicide attempt by you or a family member  an unusual or allergic reaction to topiramate, other medicines, foods, dyes, or preservatives  pregnant or trying to get pregnant  breast-feeding How should I use this medicine? Take this medicine by mouth with a glass of water. Follow the directions on the prescription label. Do not crush or chew. You may take this medicine with meals. Take your medicine at regular intervals. Do not take it more often than directed. Talk to your pediatrician regarding the use of this medicine in children. Special care may be needed. While this drug may be prescribed for children as young as 34 years of age for selected conditions, precautions do apply. Overdosage: If you think you have taken too much of this medicine contact a poison control center or emergency room at once. NOTE: This medicine is only for you. Do not share this medicine with others. What if I miss a dose? If you miss a dose, take it  as soon as you can. If your next dose is to be taken in less than 6 hours, then do not take the missed dose. Take the next dose at your regular time. Do not take double or extra doses. What may interact with this medicine? Do not take this medicine with any of the following medications:  probenecid This medicine may also interact with the following medications:  acetazolamide  alcohol  amitriptyline  aspirin and aspirin-like medicines  birth control pills  certain medicines for depression  certain medicines for seizures  certain medicines that treat or prevent blood clots like warfarin, enoxaparin, dalteparin, apixaban, dabigatran, and rivaroxaban  digoxin  hydrochlorothiazide  lithium  medicines for pain, sleep, or muscle relaxation  metformin  methazolamide  NSAIDS, medicines for pain and inflammation, like ibuprofen or naproxen  pioglitazone  risperidone This list may not describe all possible interactions. Give your health care provider a list of all the medicines, herbs, non-prescription drugs, or dietary supplements you use. Also tell them if you smoke, drink alcohol, or use illegal drugs. Some items may interact with your medicine. What should I watch for while using this medicine? Visit your doctor or health care professional for regular checks on your progress. Do not stop taking this medicine suddenly. This increases the risk of seizures if you are using this medicine to control epilepsy. Wear a medical identification bracelet or chain to say you have epilepsy or seizures, and carry a card that lists all your medicines. This medicine can decrease sweating and increase your body temperature. Watch for  signs of deceased sweating or fever, especially in children. Avoid extreme heat, hot baths, and saunas. Be careful about exercising, especially in hot weather. Contact your health care provider right away if you notice a fever or decrease in sweating. You should  drink plenty of fluids while taking this medicine. If you have had kidney stones in the past, this will help to reduce your chances of forming kidney stones. If you have stomach pain, with nausea or vomiting and yellowing of your eyes or skin, call your doctor immediately. You may get drowsy, dizzy, or have blurred vision. Do not drive, use machinery, or do anything that needs mental alertness until you know how this medicine affects you. To reduce dizziness, do not sit or stand up quickly, especially if you are an older patient. Alcohol can increase drowsiness and dizziness. Avoid alcoholic drinks. If you notice blurred vision, eye pain, or other eye problems, seek medical attention at once for an eye exam. The use of this medicine may increase the chance of suicidal thoughts or actions. Pay special attention to how you are responding while on this medicine. Any worsening of mood, or thoughts of suicide or dying should be reported to your health care professional right away. This medicine may increase the chance of developing metabolic acidosis. If left untreated, this can cause kidney stones, bone disease, or slowed growth in children. Symptoms include breathing fast, fatigue, loss of appetite, irregular heartbeat, or loss of consciousness. Call your doctor immediately if you experience any of these side effects. Also, tell your doctor about any surgery you plan on having while taking this medicine since this may increase your risk for metabolic acidosis. Birth control pills may not work properly while you are taking this medicine. Talk to your doctor about using an extra method of birth control. Women who become pregnant while using this medicine may enroll in the [place] Pregnancy Registry by calling [phone]. This registry collects information about the safety of antiepileptic drug use during pregnancy. What side effects may I notice from receiving this medicine? Side  effects that you should report to your doctor or health care professional as soon as possible:  allergic reactions like skin rash, itching or hives, swelling of the face, lips, or tongue  decreased sweating and/or rise in body temperature  depression  difficulty breathing, fast or irregular breathing patterns  difficulty speaking  difficulty walking or controlling muscle movements  hearing impairment  redness, blistering, peeling or loosening of the skin, including inside the mouth  tingling, pain or numbness in the hands or feet  unusual bleeding or bruising  unusually weak or tired  worsening of mood, thoughts or actions of suicide or dying Side effects that usually do not require medical attention (report to your doctor or health care professional if they continue or are bothersome):  altered taste  back pain, joint or muscle aches and pains  diarrhea, or constipation  headache  loss of appetite  nausea  stomach upset, indigestion  tremors This list may not describe all possible side effects. Call your doctor for medical advice about side effects. You may report side effects to FDA at 1-800-FDA-1088. Where should I keep my medicine? Keep out of the reach of children. Store at room temperature between 15 and 30 degrees C (59 and 86 degrees F) in a tightly closed container. Protect from moisture. Throw away any unused medicine after the expiration date. NOTE: This sheet is a summary. It may not cover  all possible information. If you have questions about this medicine, talk to your doctor, pharmacist, or health care provider.  2020 Elsevier/Gold Standard (2013-09-11 23:17:57)

## 2019-06-30 NOTE — Progress Notes (Signed)
Subjective:  Patient ID: [name], female    DOB: [birth year]  Age: 33 y.o. MRN: [id number]  CC: Follow-up (Depressing screening. Medications change ?? fiorcet, prozac & amitriptyline)  HPI Depression and Anxiety: improved with fluoxetine and therapy sessions. Denies any adverse side effects with fluoxetine Depression screen [facility] 2/9 06/30/2019 05/18/2019 10/29/2016  Decreased Interest 1 3 0  Down, Depressed, Hopeless 1 2 0  PHQ - 2 Score 2 5 0  Altered sleeping 2 3 -  Tired, decreased energy 1 2 -  Change in appetite 2 2 -  Feeling bad or failure about yourself  1 2 -  Trouble concentrating 1 3 -  Moving slowly or fidgety/restless 0 3 -  Suicidal thoughts 0 1 -  PHQ-9 Score 9 21 -   GAD 7 : Generalized Anxiety Score 06/30/2019 05/18/2019  Nervous, Anxious, on Edge 2 3  Control/stop worrying 1 3  Worry too much - different things 1 2  Trouble relaxing 1 2  Restless 0 1  Easily annoyed or irritable 1 3  Afraid - awful might happen 1 2  Total GAD 7 Score 7 16   Migraine headache: Worsening frequency with discontinuation of elavil. Elavil was discontinued due to daytime somnolence even as a decreased dose. Use of fioricet 2x/week with minimal improvement.  Reviewed past Medical, Social and Family history today.  Outpatient Medications Prior to Visit  Medication Sig Dispense Refill  . acetaminophen (TYLENOL) 500 MG tablet Take 1,000 mg by mouth daily as needed for headache.    . butalbital-acetaminophen-caffeine (FIORICET) 50-325-40 MG tablet TAKE 1 TABLET BY MOUTH EVERY 4 (FOUR) HOURS AS NEEDED FOR HEADACHE. 20 tablet 0  . FLUoxetine (PROZAC) 20 MG tablet Take 1 tablet (20 mg total) by mouth daily. 30 tablet 3   No facility-administered medications prior to visit.     ROS See HPI  Objective:  BP 110/70   Pulse 90   Temp 97.7 F (36.5 C) (Tympanic)   Ht 5\' 6"  (1.676 m)   Wt 254 lb 12.8 oz (115.6 kg)   SpO2 99%   BMI 41.13 kg/m   BP Readings from Last 3  Encounters:  06/30/19 110/70  06/02/19 126/82  05/18/19 132/80    Wt Readings from Last 3 Encounters:  06/30/19 254 lb 12.8 oz (115.6 kg)  06/02/19 263 lb 9.6 oz (119.6 kg)  05/18/19 259 lb 6.4 oz (117.7 kg)    Physical Exam Vitals signs reviewed.  Cardiovascular:     Rate and Rhythm: Normal rate and regular rhythm.     Pulses: Normal pulses.     Heart sounds: Normal heart sounds.  Pulmonary:     Effort: Pulmonary effort is normal.     Breath sounds: Normal breath sounds.  Neurological:     Mental Status: She is alert and oriented to person, place, and time.  Psychiatric:        Mood and Affect: Mood normal.        Behavior: Behavior normal.        Thought Content: Thought content normal.        Judgment: Judgment normal.    Lab Results  Component Value Date   WBC 6.3 05/18/2019   HGB 12.4 05/18/2019   HCT 38.5 05/18/2019   PLT 265.0 05/18/2019   GLUCOSE 93 05/18/2019   ALT 16 05/18/2019   AST 16 05/18/2019   NA 139 05/18/2019   K 4.3 05/18/2019   CL 104 05/18/2019   CREATININE 0.84 05/18/2019  BUN 12 05/18/2019   CO2 28 05/18/2019   TSH 3.28 05/18/2019   HGBA1C 6.0 05/18/2019   MICROALBUR 0.8 10/29/2016   Ct Head Wo Contrast  Result Date: 05/26/2019 CLINICAL DATA:  Severe headache and blurry vision. History of migraines. EXAM: CT HEAD WITHOUT CONTRAST TECHNIQUE: Contiguous axial images were obtained from the base of the skull through the vertex without intravenous contrast. COMPARISON:  None. FINDINGS: Brain: No evidence of acute infarction, hemorrhage, hydrocephalus, extra-axial collection or mass lesion/mass effect. Vascular: No hyperdense vessel or unexpected calcification. Skull: Normal. Negative for fracture or focal lesion. Sinuses/Orbits: No acute finding. Other: None. IMPRESSION: 1. Normal noncontrast head CT. Electronically Signed   By: Titus Dubin M.D.   On: 05/26/2019 15:02    Assessment & Plan:   [name] was seen today for follow-up.   Diagnoses and all orders for this visit:  Depression, major, single episode, severe (HCC) -     FLUoxetine (PROZAC) 20 MG tablet; Take 1 tablet (20 mg total) by mouth daily.  Migraine with aura and without status migrainosus, not intractable -     topiramate (TOPAMAX) 25 MG tablet; Take 1 tablet (25 mg total) by mouth at bedtime for 14 days, THEN 1 tablet (25 mg total) 2 (two) times daily for 14 days.   I am having [name] start on topiramate. I am also having [name] maintain [name] acetaminophen and FLUoxetine.  Meds ordered this encounter  Medications  . topiramate (TOPAMAX) 25 MG tablet    Sig: Take 1 tablet (25 mg total) by mouth at bedtime for 14 days, THEN 1 tablet (25 mg total) 2 (two) times daily for 14 days.    Dispense:  42 tablet    Refill:  1    Order Specific Question:   Supervising Provider    Answer:   MATTHEWS, CODY [[id number]]  . FLUoxetine (PROZAC) 20 MG tablet    Sig: Take 1 tablet (20 mg total) by mouth daily.    Dispense:  90 tablet    Refill:  1    Discontinue elavil    Order Specific Question:   Supervising Provider    Answer:   MATTHEWS, CODY [[id number]]    Problem List Items Addressed This Visit      Cardiovascular and Mediastinum   Migraine with aura and without status migrainosus, not intractable    Worsening frequency with discontinuation of elavil. Elavil was discontinued due to daytime somnolence even as a decreased dose. Use of fioricet 2x/week with minimal improvement.  Slowly Titrate topamax dose to 25mg  BID Maintain appt with neurology 07/06/2019      Relevant Medications   topiramate (TOPAMAX) 25 MG tablet   FLUoxetine (PROZAC) 20 MG tablet     Other   Depression, major, single episode, severe ([place]) - Primary    improved with fluoxetine and therapy sessions. Denies any adverse side effects with fluoxetine Upcoming second therapy session.  Maintain fluoxetine dose and f/up in 32month.  Depression screen [facility] 2/9 06/30/2019 05/18/2019 10/29/2016   Decreased Interest 1 3 0  Down, Depressed, Hopeless 1 2 0  PHQ - 2 Score 2 5 0  Altered sleeping 2 3 -  Tired, decreased energy 1 2 -  Change in appetite 2 2 -  Feeling bad or failure about yourself  1 2 -  Trouble concentrating 1 3 -  Moving slowly or fidgety/restless 0 3 -  Suicidal thoughts 0 1 -  PHQ-9 Score 9 21 -   GAD 7 : Generalized  Anxiety Score 06/30/2019 05/18/2019  Nervous, Anxious, on Edge 2 3  Control/stop worrying 1 3  Worry too much - different things 1 2  Trouble relaxing 1 2  Restless 0 1  Easily annoyed or irritable 1 3  Afraid - awful might happen 1 2  Total GAD 7 Score 7 16          Relevant Medications   FLUoxetine (PROZAC) 20 MG tablet       Follow-up: Return in about 4 weeks (around 07/28/2019) for depression and migraine.  Wilfred Lacy, NP

## 2019-07-03 ENCOUNTER — Ambulatory Visit (INDEPENDENT_AMBULATORY_CARE_PROVIDER_SITE_OTHER): Payer: 59 | Admitting: Psychology

## 2019-07-03 DIAGNOSIS — F322 Major depressive disorder, single episode, severe without psychotic features: Secondary | ICD-10-CM | POA: Diagnosis not present

## 2019-07-06 ENCOUNTER — Ambulatory Visit (INDEPENDENT_AMBULATORY_CARE_PROVIDER_SITE_OTHER): Payer: 59 | Admitting: Neurology

## 2019-07-06 ENCOUNTER — Other Ambulatory Visit: Payer: Self-pay

## 2019-07-06 ENCOUNTER — Encounter: Payer: Self-pay | Admitting: Neurology

## 2019-07-06 VITALS — BP 116/70 | HR 75 | Temp 98.1°F | Ht 66.0 in | Wt 257.0 lb

## 2019-07-06 DIAGNOSIS — IMO0002 Reserved for concepts with insufficient information to code with codable children: Secondary | ICD-10-CM | POA: Insufficient documentation

## 2019-07-06 DIAGNOSIS — G43709 Chronic migraine without aura, not intractable, without status migrainosus: Secondary | ICD-10-CM

## 2019-07-06 MED ORDER — ONDANSETRON 4 MG PO TBDP: 4.0000 mg | ORAL_TABLET | Freq: Three times a day (TID) | ORAL | 6 refills | Status: DC | PRN

## 2019-07-06 MED ORDER — RIZATRIPTAN BENZOATE 10 MG PO TBDP: 10.0000 mg | ORAL_TABLET | ORAL | 6 refills | Status: DC | PRN

## 2019-07-06 MED ORDER — TOPIRAMATE 50 MG PO TABS: 100.0000 mg | ORAL_TABLET | Freq: Two times a day (BID) | ORAL | 11 refills | Status: DC

## 2019-07-06 NOTE — Patient Instructions (Signed)
You may combine  Maxalt  Zofran Aleve for severe migraine

## 2019-07-06 NOTE — Progress Notes (Signed)
PATIENT: [name] DOB: 1986/02/25  Chief Complaint  Patient presents with  . Migraine    She estimates two migraines per week.  She was using NSAIDS for pain but her PCP told her to stop due to overuse.  She was started on Topamax 25mg  last week,  She is taking one tablet daily but titrating up to two tablets daily.  She was also provided a prescription of Fioricet but she has not had to use it yet.  Marland Kitchen PCP    Nche, Charlene Brooke, NP     HISTORICAL  [name] is a 33 year old female, seen in request by her primary care nurse practitioner Flossie Buffy for evaluation of chronic migraine, initial evaluation was July 06, 2019.  I have reviewed and summarized the referring note from the referring physician.  She reported a history of migraine since 33 years old, her typical migraine severe pounding headache, often preceded by tunnel vision, spreading numbness involving ipsilateral upper extremity, followed by severe pounding headache, light noise sensitivity, nauseous, lasting for few hours, she often have to sleep it off, also taking up to 1000 mg of over-the-counter Motrin.  She used to have migraine only a couple times each year, but since April 2020, she complains of increased migraine headaches, couple times each week.  She was previously given prescription of Elavil 25 mg every night, which did help her headache but she complains of drowsiness, could not function well next morning  She is given prescription of Topamax 25 mg every night, she tolerating it well,  She has never tried triptan treatment in the past. REVIEW OF SYSTEMS: Full 14 system review of systems performed and notable only for as above All other review of systems were negative.  ALLERGIES: No Known Allergies  HOME MEDICATIONS: Current Outpatient Medications  Medication Sig Dispense Refill  . butalbital-acetaminophen-caffeine (FIORICET) 50-325-40 MG tablet Take 1 tablet by mouth every 4 (four) hours  as needed for headache. 20 tablet 0  . FLUoxetine (PROZAC) 20 MG tablet Take 1 tablet (20 mg total) by mouth daily. 90 tablet 1  . topiramate (TOPAMAX) 25 MG tablet Take 1 tablet (25 mg total) by mouth at bedtime for 14 days, THEN 1 tablet (25 mg total) 2 (two) times daily for 14 days. 42 tablet 1   No current facility-administered medications for this visit.     PAST MEDICAL HISTORY: Past Medical History:  Diagnosis Date  . Chicken pox   . Headaches   . Hx of migraines   . Hx of varicella     PAST SURGICAL HISTORY: Past Surgical History:  Procedure Laterality Date  . CESAREAN SECTION  2003 & 2006  . CESAREAN SECTION N/A 04/30/2014   Procedure: REPEAT CESAREAN SECTION;  Surgeon: Allyn Kenner, DO;  Location: [facility];  Service: Obstetrics;  Laterality: N/A;  . CESAREAN SECTION N/A 05/15/2016   Procedure: CESAREAN SECTION;  Surgeon: Allyn Kenner, DO;  Location: [facility];  Service: Obstetrics;  Laterality: N/A;  . CHOLECYSTECTOMY    . TUBAL LIGATION Bilateral 05/15/2016   Procedure: BILATERAL TUBAL LIGATION;  Surgeon: Allyn Kenner, DO;  Location: [facility];  Service: Obstetrics;  Laterality: Bilateral;    FAMILY HISTORY: Family History  Problem Relation Age of Onset  . Diabetes Mother   . Hypertension Mother   . Alcohol abuse Mother   . Hyperlipidemia Mother   . Cancer Mother 54       breast cancer  . Hyperlipidemia Other   .  Obesity Other   . Diabetes Maternal Aunt   . Hypertension Maternal Aunt   . Kidney disease Maternal Aunt   . Diabetes Paternal Aunt   . Hypertension Paternal Aunt   . Diabetes Maternal Grandmother   . Heart disease Maternal Grandmother   . Diabetes Paternal Grandfather   . Hypertension Maternal Grandfather     SOCIAL HISTORY: Social History   Socioeconomic History  . Marital status: Married    Spouse name: Not on file  . Number of children: 4  . Years of education: college  . Highest education level: Associate  degree: academic program  Occupational History  . Not on file  Social Needs  . Financial resource strain: Not on file  . Food insecurity    Worry: Not on file    Inability: Not on file  . Transportation needs    Medical: Not on file    Non-medical: Not on file  Tobacco Use  . Smoking status: Never Smoker  . Smokeless tobacco: Never Used  Substance and Sexual Activity  . Alcohol use: No  . Drug use: No  . Sexual activity: Yes    Birth control/protection: None  Lifestyle  . Physical activity    Days per week: Not on file    Minutes per session: Not on file  . Stress: Not on file  Relationships  . Social Herbalist on phone: Not on file    Gets together: Not on file    Attends religious service: Not on file    Active member of club or organization: Not on file    Attends meetings of clubs or organizations: Not on file    Relationship status: Not on file  . Intimate partner violence    Fear of current or ex partner: Not on file    Emotionally abused: Not on file    Physically abused: Not on file    Forced sexual activity: Not on file  Other Topics Concern  . Not on file  Social History Narrative   Lives at home with her children.   Right-handed.   Drinks four cups caffeine daily.     PHYSICAL EXAM   Vitals:   07/06/19 0950  BP: 116/70  Pulse: 75  Temp: 98.1 F (36.7 C)  Weight: 257 lb (116.6 kg)  Height: 5\' 6"  (1.676 m)    Not recorded      Body mass index is 41.48 kg/m.  PHYSICAL EXAMNIATION:  Gen: NAD, conversant, well nourised, well groomed                     Cardiovascular: Regular rate rhythm, no peripheral edema, warm, nontender. Eyes: Conjunctivae clear without exudates or hemorrhage Neck: Supple, no carotid bruits. Pulmonary: Clear to auscultation bilaterally   NEUROLOGICAL EXAM:  MENTAL STATUS: Speech:    Speech is normal; fluent and spontaneous with normal comprehension.  Cognition:     Orientation to time, place and  person     Normal recent and remote memory     Normal Attention span and concentration     Normal Language, naming, repeating,spontaneous speech     Fund of knowledge   CRANIAL NERVES: CN II: Visual fields are full to confrontation. Fundoscopic exam is normal with sharp discs and no vascular changes. Pupils are round equal and briskly reactive to light. CN III, IV, VI: extraocular movement are normal. No ptosis. CN V: Facial sensation is intact to pinprick in all 3 divisions bilaterally.  Corneal responses are intact.  CN VII: Face is symmetric with normal eye closure and smile. CN VIII: Hearing is normal to causal conversation. CN IX, X: Palate elevates symmetrically. Phonation is normal. CN XI: Head turning and shoulder shrug are intact CN XII: Tongue is midline with normal movements and no atrophy.  MOTOR: There is no pronator drift of out-stretched arms. Muscle bulk and tone are normal. Muscle strength is normal.  REFLEXES: Reflexes are 2+ and symmetric at the biceps, triceps, knees, and ankles. Plantar responses are flexor.  SENSORY: Intact to light touch, pinprick, positional sensation and vibratory sensation are intact in fingers and toes.  COORDINATION: Rapid alternating movements and fine finger movements are intact. There is no dysmetria on finger-to-nose and heel-knee-shin.    GAIT/STANCE: Posture is normal. Gait is steady with normal steps, base, arm swing, and turning. Heel and toe walking are normal. Tandem gait is normal.  Romberg is absent.   DIAGNOSTIC DATA (LABS, IMAGING, TESTING) - I reviewed patient records, labs, notes, testing and imaging myself where available.   ASSESSMENT AND PLAN  Arnessa Zuba is a 34 y.o. female    Chronic migraine headaches  Continue titrating up Topamax 100 mg twice a day as preventive medications  Maxalt as needed  Together with Zofran, Aleve for prolonged severe migraine headache  Marcial Pacas, M.D. Ph.D.  [facility] [address] [place], [place] [address] Ph: [phone] Fax: [phone]  CC: Flossie Buffy, NP

## 2019-07-17 ENCOUNTER — Ambulatory Visit (INDEPENDENT_AMBULATORY_CARE_PROVIDER_SITE_OTHER): Payer: 59 | Admitting: Psychology

## 2019-07-17 DIAGNOSIS — F322 Major depressive disorder, single episode, severe without psychotic features: Secondary | ICD-10-CM | POA: Diagnosis not present

## 2019-07-27 ENCOUNTER — Telehealth: Payer: Self-pay | Admitting: Nurse Practitioner

## 2019-07-27 NOTE — Telephone Encounter (Signed)

## 2019-07-28 ENCOUNTER — Encounter: Payer: Self-pay | Admitting: Nurse Practitioner

## 2019-07-28 ENCOUNTER — Other Ambulatory Visit: Payer: Self-pay

## 2019-07-28 ENCOUNTER — Ambulatory Visit (INDEPENDENT_AMBULATORY_CARE_PROVIDER_SITE_OTHER): Payer: 59 | Admitting: Nurse Practitioner

## 2019-07-28 VITALS — BP 110/76 | HR 82 | Temp 97.0°F | Ht 66.0 in | Wt 248.8 lb

## 2019-07-28 DIAGNOSIS — F322 Major depressive disorder, single episode, severe without psychotic features: Secondary | ICD-10-CM | POA: Diagnosis not present

## 2019-07-28 MED ORDER — FLUOXETINE HCL 20 MG PO CAPS: 20.0000 mg | ORAL_CAPSULE | Freq: Every day | ORAL | 3 refills | Status: DC

## 2019-07-28 NOTE — Progress Notes (Signed)
Subjective:  Patient ID: [name], female    DOB: [birth year]  Age: 33 y.o. MRN: [id number]  CC: Follow-up (follow up migraine and depression---prozac change to capsule instead of tab?)  HPI Anxiety and Depression: Improved mood with use of prozac and therapy session. Denies any adverse side effects Depression screen [facility] 2/9 06/30/2019 05/18/2019 10/29/2016  Decreased Interest 1 3 0  Down, Depressed, Hopeless 1 2 0  PHQ - 2 Score 2 5 0  Altered sleeping 2 3 -  Tired, decreased energy 1 2 -  Change in appetite 2 2 -  Feeling bad or failure about yourself  1 2 -  Trouble concentrating 1 3 -  Moving slowly or fidgety/restless 0 3 -  Suicidal thoughts 0 1 -  PHQ-9 Score 9 21 -   GAD 7 : Generalized Anxiety Score 06/30/2019 05/18/2019  Nervous, Anxious, on Edge 2 3  Control/stop worrying 1 3  Worry too much - different things 1 2  Trouble relaxing 1 2  Restless 0 1  Easily annoyed or irritable 1 3  Afraid - awful might happen 1 2  Total GAD 7 Score 7 16   Migraines: Improved with maxalt prn and topamax daily. Last appt with Dr. Krista Blue 07/06/2019  Reviewed past Medical, Social and Family history today.  Outpatient Medications Prior to Visit  Medication Sig Dispense Refill  . ondansetron (ZOFRAN ODT) 4 MG disintegrating tablet Take 1 tablet (4 mg total) by mouth every 8 (eight) hours as needed. 20 tablet 6  . rizatriptan (MAXALT-MLT) 10 MG disintegrating tablet Take 1 tablet (10 mg total) by mouth as needed. May repeat in 2 hours if needed 15 tablet 6  . topiramate (TOPAMAX) 50 MG tablet Take 2 tablets (100 mg total) by mouth 2 (two) times daily. 120 tablet 11  . butalbital-acetaminophen-caffeine (FIORICET) 50-325-40 MG tablet Take 1 tablet by mouth every 4 (four) hours as needed for headache. 20 tablet 0  . FLUoxetine (PROZAC) 20 MG tablet Take 1 tablet (20 mg total) by mouth daily. 90 tablet 1  . naproxen sodium (ALEVE) 220 MG tablet Take 220 mg by mouth.    . topiramate (TOPAMAX)  25 MG tablet Take 1 tablet (25 mg total) by mouth at bedtime for 14 days, THEN 1 tablet (25 mg total) 2 (two) times daily for 14 days. (Patient not taking: Reported on 07/28/2019) 42 tablet 1   No facility-administered medications prior to visit.     ROS See HPI  Objective:  BP 110/76   Pulse 82   Temp (!) 97 F (36.1 C) (Tympanic)   Ht 5\' 6"  (1.676 m)   Wt 248 lb 12.8 oz (112.9 kg)   SpO2 99%   BMI 40.16 kg/m   BP Readings from Last 3 Encounters:  07/28/19 110/76  07/06/19 116/70  06/30/19 110/70    Wt Readings from Last 3 Encounters:  07/28/19 248 lb 12.8 oz (112.9 kg)  07/06/19 257 lb (116.6 kg)  06/30/19 254 lb 12.8 oz (115.6 kg)    Physical Exam Vitals signs reviewed.  Constitutional:      Appearance: She is obese.  Cardiovascular:     Rate and Rhythm: Normal rate and regular rhythm.     Pulses: Normal pulses.     Heart sounds: Normal heart sounds.  Pulmonary:     Effort: Pulmonary effort is normal.     Breath sounds: Normal breath sounds.  Musculoskeletal:     Right lower leg: No edema.  Left lower leg: No edema.  Neurological:     Mental Status: She is alert.  Psychiatric:        Attention and Perception: Attention normal.        Mood and Affect: Mood normal.        Speech: Speech normal.        Behavior: Behavior normal. Behavior is cooperative.        Cognition and Memory: Cognition normal.        Judgment: Judgment normal.    Lab Results  Component Value Date   WBC 6.3 05/18/2019   HGB 12.4 05/18/2019   HCT 38.5 05/18/2019   PLT 265.0 05/18/2019   GLUCOSE 93 05/18/2019   ALT 16 05/18/2019   AST 16 05/18/2019   NA 139 05/18/2019   K 4.3 05/18/2019   CL 104 05/18/2019   CREATININE 0.84 05/18/2019   BUN 12 05/18/2019   CO2 28 05/18/2019   TSH 3.28 05/18/2019   HGBA1C 6.0 05/18/2019   MICROALBUR 0.8 10/29/2016    Ct Head Wo Contrast  Result Date: 05/26/2019 CLINICAL DATA:  Severe headache and blurry vision. History of migraines.  EXAM: CT HEAD WITHOUT CONTRAST TECHNIQUE: Contiguous axial images were obtained from the base of the skull through the vertex without intravenous contrast. COMPARISON:  None. FINDINGS: Brain: No evidence of acute infarction, hemorrhage, hydrocephalus, extra-axial collection or mass lesion/mass effect. Vascular: No hyperdense vessel or unexpected calcification. Skull: Normal. Negative for fracture or focal lesion. Sinuses/Orbits: No acute finding. Other: None. IMPRESSION: 1. Normal noncontrast head CT. Electronically Signed   By: Titus Dubin M.D.   On: 05/26/2019 15:02    Assessment & Plan:   [name] was seen today for follow-up.  Diagnoses and all orders for this visit:  Depression, major, single episode, severe (HCC) -     FLUoxetine (PROZAC) 20 MG capsule; Take 1 capsule (20 mg total) by mouth daily.   I have discontinued [name] FLUoxetine, butalbital-acetaminophen-caffeine, and naproxen sodium. I am also having her start on FLUoxetine. Additionally, I am having her maintain her topiramate, topiramate, rizatriptan, and ondansetron.  Meds ordered this encounter  Medications  . FLUoxetine (PROZAC) 20 MG capsule    Sig: Take 1 capsule (20 mg total) by mouth daily.    Dispense:  90 capsule    Refill:  3    Order Specific Question:   Supervising Provider    Answer:   Lucille Passy [[id number]]    Problem List Items Addressed This Visit      Other   Depression, major, single episode, severe ([place]) - Primary   Relevant Medications   FLUoxetine (PROZAC) 20 MG capsule       Follow-up: Return in about 3 months (around 10/28/2019) for depression (virtual appt, complete PHQ form).  Wilfred Lacy, NP

## 2019-07-28 NOTE — Patient Instructions (Signed)
Changed prozac tab to cap due to cost. Maintain appt with neurology and therapist.

## 2019-07-31 ENCOUNTER — Ambulatory Visit (INDEPENDENT_AMBULATORY_CARE_PROVIDER_SITE_OTHER): Payer: 59 | Admitting: Psychology

## 2019-07-31 DIAGNOSIS — F322 Major depressive disorder, single episode, severe without psychotic features: Secondary | ICD-10-CM | POA: Diagnosis not present

## 2019-08-21 ENCOUNTER — Ambulatory Visit (INDEPENDENT_AMBULATORY_CARE_PROVIDER_SITE_OTHER): Payer: 59 | Admitting: Psychology

## 2019-08-21 DIAGNOSIS — F322 Major depressive disorder, single episode, severe without psychotic features: Secondary | ICD-10-CM | POA: Diagnosis not present

## 2019-09-04 ENCOUNTER — Ambulatory Visit (INDEPENDENT_AMBULATORY_CARE_PROVIDER_SITE_OTHER): Payer: 59 | Admitting: Psychology

## 2019-09-04 DIAGNOSIS — F322 Major depressive disorder, single episode, severe without psychotic features: Secondary | ICD-10-CM | POA: Diagnosis not present

## 2019-09-08 ENCOUNTER — Other Ambulatory Visit: Payer: Self-pay | Admitting: Nurse Practitioner

## 2019-09-08 DIAGNOSIS — G43109 Migraine with aura, not intractable, without status migrainosus: Secondary | ICD-10-CM

## 2019-09-09 ENCOUNTER — Other Ambulatory Visit: Payer: Self-pay | Admitting: Nurse Practitioner

## 2019-09-09 DIAGNOSIS — G43109 Migraine with aura, not intractable, without status migrainosus: Secondary | ICD-10-CM

## 2019-09-12 NOTE — Telephone Encounter (Signed)
LVM for the pt to call back, seem like Dr. Krista Blue sent in 1 yr supply refill back in 06/2019.

## 2019-09-25 ENCOUNTER — Ambulatory Visit (INDEPENDENT_AMBULATORY_CARE_PROVIDER_SITE_OTHER): Payer: 59 | Admitting: Psychology

## 2019-09-25 DIAGNOSIS — F322 Major depressive disorder, single episode, severe without psychotic features: Secondary | ICD-10-CM | POA: Diagnosis not present

## 2019-10-12 ENCOUNTER — Ambulatory Visit (INDEPENDENT_AMBULATORY_CARE_PROVIDER_SITE_OTHER): Payer: 59 | Admitting: Psychology

## 2019-10-12 DIAGNOSIS — F322 Major depressive disorder, single episode, severe without psychotic features: Secondary | ICD-10-CM | POA: Diagnosis not present

## 2019-10-16 NOTE — Progress Notes (Signed)
PATIENT: [name] DOB: 27-Sep-1985  REASON FOR VISIT: follow up HISTORY FROM: patient  Chief Complaint  Patient presents with  . Follow-up    3 mon f/u. Alone. New room. Patient mentioned that she hasnt had a migraine. She also mentioned that when her husband tries to wake her up she stated that she has like a daze before she fully wakes up.      HISTORY OF PRESENT ILLNESS: Today 10/17/19 [name] is a 34 y.o. female here today for follow up. She has titrated topiramate to 100mg  twice daily. Headaches and migraines have resolved. She rarely uses rizatriptan. She has noted that it takes her a few minutes to wake up in the mornings. She feels that she is in a daze for about for 15 minutes. After getting out of bed and moving around, she feels fine. She has also noted a change in taste buds. She has stopped drinking sodas. She has lost about 11 pounds since 07/2019.   HISTORY: (copied from Dr Rhea Belton note on 07/06/2019)  [name] is a 34 year old female, seen in request by her primary care nurse practitioner Flossie Buffy for evaluation of chronic migraine, initial evaluation was July 06, 2019.  I have reviewed and summarized the referring note from the referring physician.  She reported a history of migraine since 34 years old, her typical migraine severe pounding headache, often preceded by tunnel vision, spreading numbness involving ipsilateral upper extremity, followed by severe pounding headache, light noise sensitivity, nauseous, lasting for few hours, she often have to sleep it off, also taking up to 1000 mg of over-the-counter Motrin.  She used to have migraine only a couple times each year, but since April 2020, she complains of increased migraine headaches, couple times each week.  She was previously given prescription of Elavil 25 mg every night, which did help her headache but she complains of drowsiness, could not function well next morning  She is given  prescription of Topamax 25 mg every night, she tolerating it well,   REVIEW OF SYSTEMS: Out of a complete 14 system review of symptoms, the patient complains only of the following symptoms, headache and all other reviewed systems are negative.  ALLERGIES: No Known Allergies  HOME MEDICATIONS: Outpatient Medications Prior to Visit  Medication Sig Dispense Refill  . FLUoxetine (PROZAC) 20 MG capsule Take 1 capsule (20 mg total) by mouth daily. 90 capsule 3  . ondansetron (ZOFRAN ODT) 4 MG disintegrating tablet Take 1 tablet (4 mg total) by mouth every 8 (eight) hours as needed. 20 tablet 6  . rizatriptan (MAXALT-MLT) 10 MG disintegrating tablet Take 1 tablet (10 mg total) by mouth as needed. May repeat in 2 hours if needed 15 tablet 6  . topiramate (TOPAMAX) 50 MG tablet Take 2 tablets (100 mg total) by mouth 2 (two) times daily. 120 tablet 11  . topiramate (TOPAMAX) 25 MG tablet Take 1 tablet (25 mg total) by mouth at bedtime for 14 days, THEN 1 tablet (25 mg total) 2 (two) times daily for 14 days. (Patient not taking: Reported on 07/28/2019) 42 tablet 1   No facility-administered medications prior to visit.    PAST MEDICAL HISTORY: Past Medical History:  Diagnosis Date  . Chicken pox   . Headaches   . Hx of migraines   . Hx of varicella     PAST SURGICAL HISTORY: Past Surgical History:  Procedure Laterality Date  . CESAREAN SECTION  2003 & 2006  . CESAREAN  SECTION N/A 04/30/2014   Procedure: REPEAT CESAREAN SECTION;  Surgeon: Allyn Kenner, DO;  Location: [facility];  Service: Obstetrics;  Laterality: N/A;  . CESAREAN SECTION N/A 05/15/2016   Procedure: CESAREAN SECTION;  Surgeon: Allyn Kenner, DO;  Location: [facility];  Service: Obstetrics;  Laterality: N/A;  . CHOLECYSTECTOMY    . TUBAL LIGATION Bilateral 05/15/2016   Procedure: BILATERAL TUBAL LIGATION;  Surgeon: Allyn Kenner, DO;  Location: [facility];  Service: Obstetrics;  Laterality: Bilateral;     FAMILY HISTORY: Family History  Problem Relation Age of Onset  . Diabetes Mother   . Hypertension Mother   . Alcohol abuse Mother   . Hyperlipidemia Mother   . Cancer Mother 38       breast cancer  . Hyperlipidemia Other   . Obesity Other   . Diabetes Maternal Aunt   . Hypertension Maternal Aunt   . Kidney disease Maternal Aunt   . Diabetes Paternal Aunt   . Hypertension Paternal Aunt   . Diabetes Maternal Grandmother   . Heart disease Maternal Grandmother   . Diabetes Paternal Grandfather   . Hypertension Maternal Grandfather     SOCIAL HISTORY: Social History   Socioeconomic History  . Marital status: Married    Spouse name: Not on file  . Number of children: 4  . Years of education: college  . Highest education level: Associate degree: academic program  Occupational History  . Not on file  Tobacco Use  . Smoking status: Never Smoker  . Smokeless tobacco: Never Used  Substance and Sexual Activity  . Alcohol use: No  . Drug use: No  . Sexual activity: Yes    Birth control/protection: None  Other Topics Concern  . Not on file  Social History Narrative   Lives at home with her children.   Right-handed.   Drinks four cups caffeine daily.   Social Determinants of Health   Financial Resource Strain:   . Difficulty of Paying Living Expenses: Not on file  Food Insecurity:   . Worried About Charity fundraiser in the Last Year: Not on file  . Ran Out of Food in the Last Year: Not on file  Transportation Needs:   . Lack of Transportation (Medical): Not on file  . Lack of Transportation (Non-Medical): Not on file  Physical Activity:   . Days of Exercise per Week: Not on file  . Minutes of Exercise per Session: Not on file  Stress:   . Feeling of Stress : Not on file  Social Connections:   . Frequency of Communication with Friends and Family: Not on file  . Frequency of Social Gatherings with Friends and Family: Not on file  . Attends Religious Services:  Not on file  . Active Member of Clubs or Organizations: Not on file  . Attends Archivist Meetings: Not on file  . Marital Status: Not on file  Intimate Partner Violence:   . Fear of Current or Ex-Partner: Not on file  . Emotionally Abused: Not on file  . Physically Abused: Not on file  . Sexually Abused: Not on file      PHYSICAL EXAM  Vitals:   10/17/19 0849  BP: (!) 93/57  Pulse: 71  Temp: 97.6 F (36.4 C)  TempSrc: Oral  Weight: 237 lb (107.5 kg)  Height: 5\' 6"  (1.676 m)   Body mass index is 38.25 kg/m.  Generalized: Well developed, in no acute distress  Cardiology: normal rate and rhythm,  no murmur noted Neurological examination  Mentation: Alert oriented to time, place, history taking. Follows all commands speech and language fluent Cranial nerve II-XII: Pupils were equal round reactive to light. Extraocular movements were full, visual field were full on confrontational test. Facial sensation and strength were normal. Uvula tongue midline. Head turning and shoulder shrug  were normal and symmetric. Motor: The motor testing reveals 5 over 5 strength of all 4 extremities. Good symmetric motor tone is noted throughout.  Sensory: Sensory testing is intact to soft touch on all 4 extremities. No evidence of extinction is noted.  Coordination: Cerebellar testing reveals good finger-nose-finger and heel-to-shin bilaterally.  Gait and station: Gait is normal.    DIAGNOSTIC DATA (LABS, IMAGING, TESTING) - I reviewed patient records, labs, notes, testing and imaging myself where available.  No flowsheet data found.   Lab Results  Component Value Date   WBC 6.3 05/18/2019   HGB 12.4 05/18/2019   HCT 38.5 05/18/2019   MCV 79.1 05/18/2019   PLT 265.0 05/18/2019      Component Value Date/Time   NA 139 05/18/2019 1158   K 4.3 05/18/2019 1158   CL 104 05/18/2019 1158   CO2 28 05/18/2019 1158   GLUCOSE 93 05/18/2019 1158   BUN 12 05/18/2019 1158   CREATININE  0.84 05/18/2019 1158   CALCIUM 9.4 05/18/2019 1158   PROT 7.1 05/18/2019 1158   ALBUMIN 4.3 05/18/2019 1158   AST 16 05/18/2019 1158   ALT 16 05/18/2019 1158   ALKPHOS 49 05/18/2019 1158   BILITOT 0.4 05/18/2019 1158   GFRNONAA >60 04/10/2017 2324   GFRAA >60 04/10/2017 2324   No results found for: CHOL, HDL, LDLCALC, LDLDIRECT, TRIG, CHOLHDL Lab Results  Component Value Date   HGBA1C 6.0 05/18/2019   Lab Results  Component Value Date   [id number] 10/29/2016   Lab Results  Component Value Date   TSH 3.28 05/18/2019    ASSESSMENT AND PLAN 34 y.o. year old female  has a past medical history of Chicken pox, Headaches, migraines, and varicella. here with     ICD-10-CM   1. Chronic migraine  G43.709      Migraines have resolved on topiramate 100mg  twice daily. She is experiencing a change in taste buds and feels that it is a little harder to wake up in the mornings but feels that benefit of resolved headaches outweighs these side effects. We have discussed lowering dose to 75mg  twice daily but she is hesitant. We will continue current dose. She will monitor symptoms closely and call for worsening. She will follow up in 6 months, sooner if needed. She verbalizes understanding and agreement with this plan.   No orders of the defined types were placed in this encounter.    No orders of the defined types were placed in this encounter.     I spent 15 minutes with the patient. 50% of this time was spent counseling and educating patient on plan of care and medications.    Debbora Presto, FNP-C 10/17/2019, 9:34 AM [facility] [address] [place],  [address] [phone]

## 2019-10-17 ENCOUNTER — Ambulatory Visit (INDEPENDENT_AMBULATORY_CARE_PROVIDER_SITE_OTHER): Payer: 59 | Admitting: Family Medicine

## 2019-10-17 ENCOUNTER — Other Ambulatory Visit: Payer: Self-pay

## 2019-10-17 ENCOUNTER — Ambulatory Visit: Payer: 59 | Admitting: Neurology

## 2019-10-17 ENCOUNTER — Encounter: Payer: Self-pay | Admitting: Family Medicine

## 2019-10-17 VITALS — BP 93/57 | HR 71 | Temp 97.6°F | Ht 66.0 in | Wt 237.0 lb

## 2019-10-17 DIAGNOSIS — G43709 Chronic migraine without aura, not intractable, without status migrainosus: Secondary | ICD-10-CM | POA: Diagnosis not present

## 2019-10-17 DIAGNOSIS — IMO0002 Reserved for concepts with insufficient information to code with codable children: Secondary | ICD-10-CM

## 2019-10-17 NOTE — Patient Instructions (Signed)
We will continue topiramate 100mg  twice daily, rizatriptan as needed   We will continue to monitor symptoms, call for worsening   Stay well hydrated and eat healthy well balanced diet  Follow up in 6 months   Migraine Headache A migraine headache is a very strong throbbing pain on one side or both sides of your head. This type of headache can also cause other symptoms. It can last from 4 hours to 3 days. Talk with your doctor about what things may bring on (trigger) this condition. What are the causes? The exact cause of this condition is not known. This condition may be triggered or caused by:  Drinking alcohol.  Smoking.  Taking medicines, such as: ? Medicine used to treat chest pain (nitroglycerin). ? Birth control pills. ? Estrogen. ? Some blood pressure medicines.  Eating or drinking certain products.  Doing physical activity. Other things that may trigger a migraine headache include:  Having a menstrual period.  Pregnancy.  Hunger.  Stress.  Not getting enough sleep or getting too much sleep.  Weather changes.  Tiredness (fatigue). What increases the risk?  Being 110-6 years old.  Being female.  Having a family history of migraine headaches.  Being Caucasian.  Having depression or anxiety.  Being very overweight. What are the signs or symptoms?  A throbbing pain. This pain may: ? Happen in any area of the head, such as on one side or both sides. ? Make it hard to do daily activities. ? Get worse with physical activity. ? Get worse around bright lights or loud noises.  Other symptoms may include: ? Feeling sick to your stomach (nauseous). ? Vomiting. ? Dizziness. ? Being sensitive to bright lights, loud noises, or smells.  Before you get a migraine headache, you may get warning signs (an aura). An aura may include: ? Seeing flashing lights or having blind spots. ? Seeing bright spots, halos, or zigzag lines. ? Having tunnel vision or blurred  vision. ? Having numbness or a tingling feeling. ? Having trouble talking. ? Having weak muscles.  Some people have symptoms after a migraine headache (postdromal phase), such as: ? Tiredness. ? Trouble thinking (concentrating). How is this treated?  Taking medicines that: ? Relieve pain. ? Relieve the feeling of being sick to your stomach. ? Prevent migraine headaches.  Treatment may also include: ? Having acupuncture. ? Avoiding foods that bring on migraine headaches. ? Learning ways to control your body functions (biofeedback). ? Therapy to help you know and deal with negative thoughts (cognitive behavioral therapy). Follow these instructions at home: Medicines  Take over-the-counter and prescription medicines only as told by your doctor.  Ask your doctor if the medicine prescribed to you: ? Requires you to avoid driving or using heavy machinery. ? Can cause trouble pooping (constipation). You may need to take these steps to prevent or treat trouble pooping:  Drink enough fluid to keep your pee (urine) pale yellow.  Take over-the-counter or prescription medicines.  Eat foods that are high in fiber. These include beans, whole grains, and fresh fruits and vegetables.  Limit foods that are high in fat and sugar. These include fried or sweet foods. Lifestyle  Do not drink alcohol.  Do not use any products that contain nicotine or tobacco, such as cigarettes, e-cigarettes, and chewing tobacco. If you need help quitting, ask your doctor.  Get at least 8 hours of sleep every night.  Limit and deal with stress. General instructions  Keep a journal to find out what may bring on your migraine headaches. For example, write down: ? What you eat and drink. ? How much sleep you get. ? Any change in what you eat or drink. ? Any change in your medicines.  If you have a migraine headache: ? Avoid things that make your symptoms worse, such as bright lights. ? It may  help to lie down in a dark, quiet room. ? Do not drive or use heavy machinery. ? Ask your doctor what activities are safe for you.  Keep all follow-up visits as told by your doctor. This is important. Contact a doctor if:  You get a migraine headache that is different or worse than others you have had.  You have more than 15 headache days in one month. Get help right away if:  Your migraine headache gets very bad.  Your migraine headache lasts longer than 72 hours.  You have a fever.  You have a stiff neck.  You have trouble seeing.  Your muscles feel weak or like you cannot control them.  You start to lose your balance a lot.  You start to have trouble walking.  You pass out (faint).  You have a seizure. Summary  A migraine headache is a very strong throbbing pain on one side or both sides of your head. These headaches can also cause other symptoms.  This condition may be treated with medicines and changes to your lifestyle.  Keep a journal to find out what may bring on your migraine headaches.  Contact a doctor if you get a migraine headache that is different or worse than others you have had.  Contact your doctor if you have more than 15 headache days in a month. This information is not intended to replace advice given to you by your health care provider. Make sure you discuss any questions you have with your health care provider. Document Revised: 12/30/2018 Document Reviewed: 10/20/2018 Elsevier Patient Education  [place].    Topiramate tablets What is this medicine? TOPIRAMATE (toe PYRE a mate) is used to treat seizures in adults or children with epilepsy. It is also used for the prevention of migraine headaches. This medicine may be used for other purposes; ask your health care provider or pharmacist if you have questions. COMMON BRAND NAME(S): Topamax, Topiragen What should I tell my health care provider before I take this medicine? They need  to know if you have any of these conditions:  bleeding disorders  kidney disease  lung or breathing disease, like asthma  suicidal thoughts, plans, or attempt; a previous suicide attempt by you or a family member  an unusual or allergic reaction to topiramate, other medicines, foods, dyes, or preservatives  pregnant or trying to get pregnant  breast-feeding How should I use this medicine? Take this medicine by mouth with a glass of water. Follow the directions on the prescription label. Do not cut, crush or chew this medicine. Swallow the tablets whole. You can take it with or without food. If it upsets your stomach, take it with food. Take your medicine at regular intervals. Do not take it more often than directed. Do not stop taking except on your doctor's advice. A special MedGuide will be given to you by the pharmacist with each prescription and refill. Be sure to read this information carefully each time. Talk to your pediatrician regarding the use of this medicine in children. While this drug may be prescribed for children as  young as 73 years of age for selected conditions, precautions do apply. Overdosage: If you think you have taken too much of this medicine contact a poison control center or emergency room at once. NOTE: This medicine is only for you. Do not share this medicine with others. What if I miss a dose? If you miss a dose, take it as soon as you can. If your next dose is to be taken in less than 6 hours, then do not take the missed dose. Take the next dose at your regular time. Do not take double or extra doses. What may interact with this medicine? This medicine may interact with the following medications:  acetazolamide  alcohol  antihistamines for allergy, cough, and cold  aspirin and aspirin-like medicines  atropine  birth control pills  certain medicines for anxiety or sleep  certain medicines for bladder problems like oxybutynin, tolterodine  certain  medicines for depression like amitriptyline, fluoxetine, sertraline  certain medicines for seizures like carbamazepine, phenobarbital, phenytoin, primidone, valproic acid, zonisamide  certain medicines for stomach problems like dicyclomine, hyoscyamine  certain medicines for travel sickness like scopolamine  certain medicines for Parkinson's disease like benztropine, trihexyphenidyl  certain medicines that treat or prevent blood clots like warfarin, enoxaparin, dalteparin, apixaban, dabigatran, and rivaroxaban  digoxin  general anesthetics like halothane, isoflurane, methoxyflurane, propofol  hydrochlorothiazide  ipratropium  lithium  medicines that relax muscles for surgery  metformin  narcotic medicines for pain  NSAIDs, medicines for pain and inflammation, like ibuprofen or naproxen  phenothiazines like chlorpromazine, mesoridazine, prochlorperazine, thioridazine  pioglitazone This list may not describe all possible interactions. Give your health care provider a list of all the medicines, herbs, non-prescription drugs, or dietary supplements you use. Also tell them if you smoke, drink alcohol, or use illegal drugs. Some items may interact with your medicine. What should I watch for while using this medicine? Visit your doctor or health care professional for regular checks on your progress. Tell your health care professional if your symptoms do not start to get better or if they get worse. Do not stop taking except on your health care professional's advice. You may develop a severe reaction. Your health care professional will tell you how much medicine to take. Wear a medical ID bracelet or chain. Carry a card that describes your disease and details of your medicine and dosage times. This medicine can reduce the response of your body to heat or cold. Dress warm in cold weather and stay hydrated in hot weather. If possible, avoid extreme temperatures like saunas, hot tubs, very  hot or cold showers, or activities that can cause dehydration such as vigorous exercise. Check with your health care professional if you have severe diarrhea, nausea, and vomiting, or if you sweat a lot. The loss of too much body fluid may make it dangerous for you to take this medicine. You may get drowsy or dizzy. Do not drive, use machinery, or do anything that needs mental alertness until you know how this medicine affects you. Do not stand up or sit up quickly, especially if you are an older patient. This reduces the risk of dizzy or fainting spells. Alcohol may interfere with the effect of this medicine. Avoid alcoholic drinks. Tell your health care professional right away if you have any change in your eyesight. Patients and their families should watch out for new or worsening depression or thoughts of suicide. Also watch out for sudden changes in feelings such as feeling anxious, agitated, panicky,  irritable, hostile, aggressive, impulsive, severely restless, overly excited and hyperactive, or not being able to sleep. If this happens, especially at the beginning of treatment or after a change in dose, call your healthcare professional. This medicine may cause serious skin reactions. They can happen weeks to months after starting the medicine. Contact your health care provider right away if you notice fevers or flu-like symptoms with a rash. The rash may be red or purple and then turn into blisters or peeling of the skin. Or, you might notice a red rash with swelling of the face, lips or lymph nodes in your neck or under your arms. Birth control may not work properly while you are taking this medicine. Talk to your health care professional about using an extra method of birth control. Women should inform their health care professional if they wish to become pregnant or think they might be pregnant. There is a potential for serious side effects and harm to an unborn child. Talk to your health care  professional for more information. What side effects may I notice from receiving this medicine? Side effects that you should report to your doctor or health care professional as soon as possible:  allergic reactions like skin rash, itching or hives, swelling of the face, lips, or tongue  blood in the urine  changes in vision  confusion  loss of memory  pain in lower back or side  pain when urinating  redness, blistering, peeling or loosening of the skin, including inside the mouth  signs and symptoms of bleeding such as bloody or black, tarry stools; red or dark brown urine; spitting up blood or brown material that looks like coffee grounds; red spots on the skin; unusual bruising or bleeding from the eyes, gums, or nose  signs and symptoms of increased acid in the body like breathing fast; fast heartbeat; headache; confusion; unusually weak or tired; nausea, vomiting  suicidal thoughts, mood changes  trouble speaking or understanding  unusual sweating  unusually weak or tired Side effects that usually do not require medical attention (report to your doctor or health care professional if they continue or are bothersome):  dizziness  drowsiness  fever  loss of appetite  nausea, vomiting  pain, tingling, numbness in the hands or feet  stomach pain  tiredness  upset stomach This list may not describe all possible side effects. Call your doctor for medical advice about side effects. You may report side effects to FDA at 1-800-FDA-1088. Where should I keep my medicine? Keep out of the reach of children. Store at room temperature between 15 and 30 degrees C (59 and 86 degrees F). Throw away any unused medicine after the expiration date. NOTE: This sheet is a summary. It may not cover all possible information. If you have questions about this medicine, talk to your doctor, pharmacist, or health care provider.  2020 Elsevier/Gold Standard (2019-04-06 15:07:20)

## 2019-10-26 ENCOUNTER — Telehealth (INDEPENDENT_AMBULATORY_CARE_PROVIDER_SITE_OTHER): Payer: 59 | Admitting: Nurse Practitioner

## 2019-10-26 ENCOUNTER — Other Ambulatory Visit: Payer: Self-pay

## 2019-10-26 ENCOUNTER — Encounter: Payer: Self-pay | Admitting: Nurse Practitioner

## 2019-10-26 VITALS — BP 112/64 | HR 71 | Temp 96.4°F | Ht 66.0 in | Wt 235.0 lb

## 2019-10-26 DIAGNOSIS — F322 Major depressive disorder, single episode, severe without psychotic features: Secondary | ICD-10-CM | POA: Diagnosis not present

## 2019-10-26 MED ORDER — FLUOXETINE HCL 20 MG PO TABS: 30.0000 mg | ORAL_TABLET | Freq: Every day | ORAL | 5 refills | Status: DC

## 2019-10-26 NOTE — Progress Notes (Signed)
Virtual Visit via Video Note  I connected with@ on 10/26/19 at 10:15 AM EST by a video enabled telemedicine application and verified that I am speaking with the correct person using two identifiers.  Location: Patient:Home Provider: Office Participants: patient and provider  I discussed the limitations of evaluation and management by telemedicine and the availability of in person appointments. I also discussed with the patient that there may be a patient responsible charge related to this service. The patient expressed understanding and agreed to proceed.  [id number] f/up  History of Present Illness:  Stable mood with prozac and therapy sessions. Reports feeling overwhelming intermittently Denies any adverse side effects. Depression screen [facility] 2/9 10/26/2019 06/30/2019 05/18/2019  Decreased Interest 2 1 3   Down, Depressed, Hopeless 1 1 2   PHQ - 2 Score 3 2 5   Altered sleeping 1 2 3   Tired, decreased energy 1 1 2   Change in appetite 1 2 2   Feeling bad or failure about yourself  1 1 2   Trouble concentrating 1 1 3   Moving slowly or fidgety/restless 0 0 3  Suicidal thoughts 0 0 1  PHQ-9 Score 8 9 21    GAD 7 : Generalized Anxiety Score 10/26/2019 06/30/2019 05/18/2019  Nervous, Anxious, on Edge 1 2 3   Control/stop worrying 0 1 3  Worry too much - different things 1 1 2   Trouble relaxing 0 1 2  Restless 0 0 1  Easily annoyed or irritable 1 1 3   Afraid - awful might happen 1 1 2   Total GAD 7 Score 4 7 16    Observations/Objective: Physical Exam  Constitutional: She is oriented to person, place, and time.  Cardiovascular: Normal rate.  Neurological: She is alert and oriented to person, place, and time.  Psychiatric: She has a normal mood and affect. Her behavior is normal. Thought content normal.  Vitals reviewed.  Assessment and Plan: Shanterra was seen today for follow-up.  Diagnoses and all orders for this visit:  Depression, major, single episode, severe (HCC) -      FLUoxetine (PROZAC) 20 MG tablet; Take 1.5 tablets (30 mg total) by mouth daily.   Follow Up Instructions: See avs   I discussed the assessment and treatment plan with the patient. The patient was provided an opportunity to ask questions and all were answered. The patient agreed with the plan and demonstrated an understanding of the instructions.   The patient was advised to call back or seek an in-person evaluation if the symptoms worsen or if the condition fails to improve as anticipated.   Wilfred Lacy, NP

## 2019-10-26 NOTE — Assessment & Plan Note (Signed)
Stable mood with prozac and therapy sessions. Reports feeling overwhelming intermittently Denies any adverse side effects.  Increase prozac dose to 30mg  Continue sessions with therapist

## 2019-10-26 NOTE — Patient Instructions (Signed)
Dose change to 30mg  daily. Continue therapy sessions.

## 2019-10-27 ENCOUNTER — Ambulatory Visit (INDEPENDENT_AMBULATORY_CARE_PROVIDER_SITE_OTHER): Payer: 59 | Admitting: Psychology

## 2019-10-27 DIAGNOSIS — F322 Major depressive disorder, single episode, severe without psychotic features: Secondary | ICD-10-CM

## 2019-11-17 ENCOUNTER — Ambulatory Visit (INDEPENDENT_AMBULATORY_CARE_PROVIDER_SITE_OTHER): Payer: 59 | Admitting: Psychology

## 2019-11-17 DIAGNOSIS — F322 Major depressive disorder, single episode, severe without psychotic features: Secondary | ICD-10-CM | POA: Diagnosis not present

## 2019-11-22 NOTE — Progress Notes (Signed)
I have reviewed and agreed above plan.  If she continues to complains side effect with lower dose of topamax 75mg  bid, may consider change to 100mg  at night only as migraine prevention

## 2019-12-09 ENCOUNTER — Ambulatory Visit (INDEPENDENT_AMBULATORY_CARE_PROVIDER_SITE_OTHER): Payer: 59 | Admitting: Psychology

## 2019-12-09 DIAGNOSIS — F322 Major depressive disorder, single episode, severe without psychotic features: Secondary | ICD-10-CM | POA: Diagnosis not present

## 2020-01-29 ENCOUNTER — Ambulatory Visit (INDEPENDENT_AMBULATORY_CARE_PROVIDER_SITE_OTHER): Payer: 59 | Admitting: Nurse Practitioner

## 2020-01-29 ENCOUNTER — Encounter: Payer: Self-pay | Admitting: Nurse Practitioner

## 2020-01-29 ENCOUNTER — Other Ambulatory Visit: Payer: Self-pay

## 2020-01-29 VITALS — BP 114/72 | HR 77 | Temp 96.7°F | Ht 66.0 in | Wt 223.8 lb

## 2020-01-29 DIAGNOSIS — Z6836 Body mass index (BMI) 36.0-36.9, adult: Secondary | ICD-10-CM | POA: Diagnosis not present

## 2020-01-29 DIAGNOSIS — R7301 Impaired fasting glucose: Secondary | ICD-10-CM | POA: Diagnosis not present

## 2020-01-29 DIAGNOSIS — Z0000 Encounter for general adult medical examination without abnormal findings: Secondary | ICD-10-CM | POA: Diagnosis not present

## 2020-01-29 DIAGNOSIS — F322 Major depressive disorder, single episode, severe without psychotic features: Secondary | ICD-10-CM | POA: Diagnosis not present

## 2020-01-29 DIAGNOSIS — Z13220 Encounter for screening for lipoid disorders: Secondary | ICD-10-CM

## 2020-01-29 DIAGNOSIS — Z136 Encounter for screening for cardiovascular disorders: Secondary | ICD-10-CM | POA: Diagnosis not present

## 2020-01-29 DIAGNOSIS — Z0001 Encounter for general adult medical examination with abnormal findings: Secondary | ICD-10-CM

## 2020-01-29 LAB — COMPREHENSIVE METABOLIC PANEL
ALT: 11 U/L (ref 0–35)
AST: 13 U/L (ref 0–37)
Albumin: 4.3 g/dL (ref 3.5–5.2)
Alkaline Phosphatase: 56 U/L (ref 39–117)
BUN: 10 mg/dL (ref 6–23)
CO2: 25 mEq/L (ref 19–32)
Calcium: 9 mg/dL (ref 8.4–10.5)
Chloride: 107 mEq/L (ref 96–112)
Creatinine, Ser: 0.83 mg/dL (ref 0.40–1.20)
GFR: 94.77 mL/min (ref >60.00)
Glucose, Bld: 85 mg/dL (ref 70–99)
Potassium: 3.8 mEq/L (ref 3.5–5.1)
Sodium: 137 mEq/L (ref 135–145)
Total Bilirubin: 0.3 mg/dL (ref 0.2–1.2)
Total Protein: 7.1 g/dL (ref 6.0–8.3)

## 2020-01-29 LAB — LIPID PANEL
Cholesterol: 234 mg/dL — ABNORMAL HIGH (ref 0–200)
HDL: 50.9 mg/dL (ref >39.00)
LDL Cholesterol: 167 mg/dL — ABNORMAL HIGH (ref 0–99)
NonHDL: 183.56
Total CHOL/HDL Ratio: 5
Triglycerides: 83 mg/dL (ref 0.0–149.0)
VLDL: 16.6 mg/dL (ref 0.0–40.0)

## 2020-01-29 LAB — CBC
HCT: 37.4 % (ref 36.0–46.0)
Hemoglobin: 12.1 g/dL (ref 12.0–15.0)
MCHC: 32.4 g/dL (ref 30.0–36.0)
MCV: 78 fl (ref 78.0–100.0)
Platelets: 250 10*3/uL (ref 150.0–400.0)
RBC: 4.8 Mil/uL (ref 3.87–5.11)
RDW: 14.3 % (ref 11.5–15.5)
WBC: 6 10*3/uL (ref 4.0–10.5)

## 2020-01-29 LAB — TSH: TSH: 2.56 u[IU]/mL (ref 0.35–4.50)

## 2020-01-29 LAB — HEMOGLOBIN A1C: Hgb A1c MFr Bld: 5.2 % (ref 4.6–6.5)

## 2020-01-29 MED ORDER — FLUOXETINE HCL 20 MG PO TABS: 30.0000 mg | ORAL_TABLET | Freq: Every day | ORAL | 5 refills | Status: DC

## 2020-01-29 MED ORDER — VENLAFAXINE HCL ER 37.5 MG PO CP24: 37.5000 mg | ORAL_CAPSULE | Freq: Every day | ORAL | 1 refills | Status: DC

## 2020-01-29 NOTE — Assessment & Plan Note (Signed)
Advised about need for DASH diet, small portions and regular exercise.

## 2020-01-29 NOTE — Progress Notes (Signed)
Subjective:    Patient ID: [name], female    DOB: 07/20/1986, 34 y.o.   MRN: [id number]  Patient presents today for complete physical and re eval of depression.  HPI  Sexual History (orientation,birth control, marital status, STD):married, sexually active, PAP and breast exam done by GYN per patient.  Depression/Suicide:no improvement with prozac. She has discontinue CBT sessions, reports suicide ideation but no plans or intention to act on thoughts. No hx of suicide attempt, no hx of inpatient [facility] admission. Denies presences of any weapons at home Depression screen [facility] 2/9 01/29/2020 01/29/2020 10/26/2019 06/30/2019 05/18/2019 10/29/2016 03/12/2016  Decreased Interest 2 1 2 1 3  0 0  Down, Depressed, Hopeless 2 1 1 1 2  0 0  PHQ - 2 Score 4 2 3 2 5  0 0  Altered sleeping 3 - 1 2 3  - -  Tired, decreased energy 2 - 1 1 2  - -  Change in appetite 3 - 1 2 2  - -  Feeling bad or failure about yourself  1 - 1 1 2  - -  Trouble concentrating 2 - 1 1 3  - -  Moving slowly or fidgety/restless 1 - 0 0 3 - -  Suicidal thoughts 2 - 0 0 1 - -  PHQ-9 Score 18 - 8 9 21  - -   Vision:will schedule  Dental:will schedule  Immunizations: (TDAP, Hep C screen, Pneumovax, Influenza, zoster)  Health Maintenance  Topic Date Due  . Pap Smear  05/23/2019  . Flu Shot  04/21/2020  . Hemoglobin A1C  07/31/2020  . Tetanus Vaccine  02/18/2026  . COVID-19 Vaccine  Completed  . HIV Screening  Completed  . Complete foot exam   Discontinued  . Eye exam for diabetics  Discontinued  . Urine Protein Check  Discontinued  . Pneumococcal vaccine  Discontinued   Diet:regular. Weight:  Wt Readings from Last 3 Encounters:  01/29/20 223 lb 12.8 oz (101.5 kg)  10/26/19 235 lb (106.6 kg)  10/17/19 237 lb (107.5 kg)   Exercise:none  Fall Risk: Fall Risk  01/29/2020 05/18/2019 10/29/2016 03/12/2016 02/08/2014  Falls in the past year? 0 0 No No No  Number falls in past yr: 0 - - - -  Injury with Fall? 0 - - - -   Medications  and allergies reviewed with patient and updated if appropriate.  Patient Active Problem List   Diagnosis Date Noted  . Chronic migraine 07/06/2019  . Carpal tunnel syndrome 05/18/2019  . History of cesarean section 05/18/2019  . History of gestational diabetes mellitus 05/18/2019  . Rectal pain 05/18/2019  . S/P tubal ligation 05/18/2019  . Migraine with aura and without status migrainosus, not intractable 05/18/2019  . Depression, major, single episode, severe ([place]) 05/18/2019  . Abnormal peripheral vision of both eyes 05/18/2019  . Paresthesia of both lower extremities 10/29/2016  . Class 2 severe obesity due to excess calories with serious comorbidity and body mass index (BMI) of 36.0 to 36.9 in adult ([place]) 10/23/2015  . Bell's palsy 04/21/2014    Current Outpatient Medications on File Prior to Visit  Medication Sig Dispense Refill  . ondansetron (ZOFRAN ODT) 4 MG disintegrating tablet Take 1 tablet (4 mg total) by mouth every 8 (eight) hours as needed. 20 tablet 6  . rizatriptan (MAXALT-MLT) 10 MG disintegrating tablet Take 1 tablet (10 mg total) by mouth as needed. May repeat in 2 hours if needed 15 tablet 6  . topiramate (TOPAMAX) 50 MG tablet Take 2 tablets (  100 mg total) by mouth 2 (two) times daily. 120 tablet 11   No current facility-administered medications on file prior to visit.    Past Medical History:  Diagnosis Date  . Chicken pox   . Headaches   . Hx of migraines   . Hx of varicella     Past Surgical History:  Procedure Laterality Date  . CESAREAN SECTION  2003 & 2006  . CESAREAN SECTION N/A 04/30/2014   Procedure: REPEAT CESAREAN SECTION;  Surgeon: Allyn Kenner, DO;  Location: [facility];  Service: Obstetrics;  Laterality: N/A;  . CESAREAN SECTION N/A 05/15/2016   Procedure: CESAREAN SECTION;  Surgeon: Allyn Kenner, DO;  Location: [place];  Service: Obstetrics;  Laterality: N/A;  . CHOLECYSTECTOMY    . TUBAL LIGATION Bilateral 05/15/2016    Procedure: BILATERAL TUBAL LIGATION;  Surgeon: Allyn Kenner, DO;  Location: [place];  Service: Obstetrics;  Laterality: Bilateral;    Social History   Socioeconomic History  . Marital status: Married    Spouse name: Not on file  . Number of children: 4  . Years of education: college  . Highest education level: Associate degree: academic program  Occupational History  . Not on file  Tobacco Use  . Smoking status: Never Smoker  . Smokeless tobacco: Never Used  Substance and Sexual Activity  . Alcohol use: No  . Drug use: No  . Sexual activity: Yes    Birth control/protection: None  Other Topics Concern  . Not on file  Social History Narrative   Lives at home with her children.   Right-handed.   Drinks four cups caffeine daily.   Social Determinants of Health   Financial Resource Strain:   . Difficulty of Paying Living Expenses:   Food Insecurity:   . Worried About Charity fundraiser in the Last Year:   . Arboriculturist in the Last Year:   Transportation Needs:   . Film/video editor (Medical):   Marland Kitchen Lack of Transportation (Non-Medical):   Physical Activity:   . Days of Exercise per Week:   . Minutes of Exercise per Session:   Stress:   . Feeling of Stress :   Social Connections:   . Frequency of Communication with Friends and Family:   . Frequency of Social Gatherings with Friends and Family:   . Attends Religious Services:   . Active Member of Clubs or Organizations:   . Attends Archivist Meetings:   Marland Kitchen Marital Status:     Family History  Problem Relation Age of Onset  . Diabetes Mother   . Hypertension Mother   . Alcohol abuse Mother   . Hyperlipidemia Mother   . Cancer Mother 31       breast cancer  . Hyperlipidemia Other   . Obesity Other   . Diabetes Maternal Aunt   . Hypertension Maternal Aunt   . Kidney disease Maternal Aunt   . Diabetes Paternal Aunt   . Hypertension Paternal Aunt   . Diabetes Maternal Grandmother    . Heart disease Maternal Grandmother   . Diabetes Paternal Grandfather   . Hypertension Maternal Grandfather         Review of Systems  Constitutional: Negative for fever, malaise/fatigue and weight loss.  HENT: Negative for congestion and sore throat.   Eyes:       Negative for visual changes  Respiratory: Negative for cough and shortness of breath.   Cardiovascular: Negative for chest pain, palpitations  and leg swelling.  Gastrointestinal: Negative for blood in stool, constipation, diarrhea and heartburn.  Genitourinary: Negative for dysuria, frequency and urgency.  Musculoskeletal: Negative for falls, joint pain and myalgias.  Skin: Negative for rash.  Neurological: Negative for dizziness, sensory change and headaches.  Endo/Heme/Allergies: Does not bruise/bleed easily.  Psychiatric/Behavioral: Positive for depression and suicidal ideas. Negative for hallucinations, memory loss and substance abuse. The patient is nervous/anxious. The patient does not have insomnia.     Objective:   Vitals:   01/29/20 1007  BP: 114/72  Pulse: 77  Temp: (!) 96.7 F (35.9 C)  SpO2: 100%    Body mass index is 36.12 kg/m.   Physical Examination:  Physical Exam Vitals reviewed.  Constitutional:      General: She is not in acute distress.    Appearance: She is well-developed. She is obese.  HENT:     Right Ear: Tympanic membrane, ear canal and external ear normal.     Left Ear: Tympanic membrane, ear canal and external ear normal.  Eyes:     Extraocular Movements: Extraocular movements intact.     Conjunctiva/sclera: Conjunctivae normal.  Cardiovascular:     Rate and Rhythm: Normal rate and regular rhythm.     Heart sounds: Normal heart sounds.  Pulmonary:     Effort: Pulmonary effort is normal. No respiratory distress.     Breath sounds: Normal breath sounds.  Chest:     Chest wall: No tenderness.  Abdominal:     General: Bowel sounds are normal.     Palpations: Abdomen is  soft.  Genitourinary:    Comments: Deferred breast and pelvic exam to GYN Musculoskeletal:        General: Normal range of motion.     Cervical back: Normal range of motion and neck supple.     Right lower leg: No edema.  Lymphadenopathy:     Cervical: No cervical adenopathy.  Skin:    General: Skin is warm and dry.  Neurological:     Mental Status: She is alert and oriented to person, place, and time.     Deep Tendon Reflexes: Reflexes are normal and symmetric.  Psychiatric:        Attention and Perception: Attention normal.        Mood and Affect: Mood is depressed.        Behavior: Behavior is slowed. Behavior is cooperative.        Thought Content: Thought content is not paranoid. Thought content includes suicidal ideation. Thought content does not include homicidal or suicidal plan.        Cognition and Memory: Cognition normal.        Judgment: Judgment normal.    ASSESSMENT and PLAN: This visit occurred during the SARS-CoV-2 public health emergency.  Safety protocols were in place, including screening questions prior to the visit, additional usage of staff PPE, and extensive cleaning of exam room while observing appropriate contact time as indicated for disinfecting solutions.   Leanza was seen today for annual exam.  Diagnoses and all orders for this visit:  Encounter for preventative adult health care exam with abnormal findings -     CBC -     Comprehensive metabolic panel  Depression, major, single episode, severe ([place]) -     Ambulatory referral to Psychiatry -     venlafaxine XR (EFFEXOR-XR) 37.5 MG 24 hr capsule; Take 1 capsule (37.5 mg total) by mouth daily with breakfast. -     FLUoxetine (PROZAC) 20  MG tablet; Take 1.5 tablets (30 mg total) by mouth daily.  Encounter for lipid screening for cardiovascular disease -     Lipid panel  Class 2 severe obesity due to excess calories with serious comorbidity and body mass index (BMI) of 36.0 to 36.9 in adult (HCC)  -     TSH -     Vitamin D 1,25 dihydroxy  Impaired fasting glucose -     Hemoglobin A1c    Depression, major, single episode, severe (HCC) no improvement with prozac. She has discontinue CBT sessions, reports suicide ideation but no plans or intention to act on thoughts. No hx of suicide attempt, no hx of inpatient [facility] admission.  She is able to contract for safety. Advised to Call 911 or go to ED if suicidal ideation become more pervasive. She verbalized understanding entered psychiatrist referral Continue prozac Start effexor F/up in 2weeks  Class 2 severe obesity due to excess calories with serious comorbidity and body mass index (BMI) of 36.0 to 36.9 in adult [facility]) Advised about need for DASH diet, small portions and regular exercise.      Problem List Items Addressed This Visit      Other   Class 2 severe obesity due to excess calories with serious comorbidity and body mass index (BMI) of 36.0 to 36.9 in adult [facility])    Advised about need for DASH diet, small portions and regular exercise.      Relevant Orders   TSH (Completed)   Vitamin D 1,25 dihydroxy   Depression, major, single episode, severe ([place])    no improvement with prozac. She has discontinue CBT sessions, reports suicide ideation but no plans or intention to act on thoughts. No hx of suicide attempt, no hx of inpatient [facility] admission.  She is able to contract for safety. Advised to Call 911 or go to ED if suicidal ideation become more pervasive. She verbalized understanding entered psychiatrist referral Continue prozac Start effexor F/up in 2weeks      Relevant Medications   venlafaxine XR (EFFEXOR-XR) 37.5 MG 24 hr capsule   FLUoxetine (PROZAC) 20 MG tablet   Other Relevant Orders   Ambulatory referral to Psychiatry    Other Visit Diagnoses    Encounter for preventative adult health care exam with abnormal findings    -  Primary   Relevant Orders   CBC (Completed)   Comprehensive metabolic panel  (Completed)   Encounter for lipid screening for cardiovascular disease       Relevant Orders   Lipid panel (Completed)   Impaired fasting glucose       Relevant Orders   Hemoglobin A1c (Completed)      Follow up: Return in about 2 weeks (around 02/12/2020) for anxiety and depression (video, 30mins).  Wilfred Lacy, NP

## 2020-01-29 NOTE — Patient Instructions (Addendum)
Go to lab for blood draw.  Schedule appt for annual eye exam and biannual dental cleaning. Have PAP results faxed to me once completed.  Maintain DASH diet and daily exercise (45-46mins of walking and strengthen exercise)  You will be contacted to schedule appt with psychiatrist Schedule appt with therapist. Continue prozac Start effexor  Call 911 or go to ED if suicidal ideation become more pervasive.  Suicidal Feelings: How to Help Yourself Suicide is when you end your own life. There are many things you can do to help yourself feel better when struggling with these feelings. Many services and people are available to support you and others who struggle with similar feelings.  If you ever feel like you may hurt yourself or others, or have thoughts about taking your own life, get help right away. To get help:  Call your local emergency services (911 in the [place]  [facility] [place] [address]'s health and human services helpline (211 in the [place]  Go to your nearest emergency department.  Call a suicide hotline to speak with a trained counselor. The following suicide hotlines are available in the [place]: ? 1-800-273-TALK 561-416-6468). ? 1-800-SUICIDE 423-699-4934). ? 601-359-8622. This is a hotline for Spanish speakers. ? 3090706452. This is a hotline for TTY users. ? 1-866-4-U-TREVOR 443-601-5272). This is a hotline for lesbian, gay, bisexual, transgender, or questioning youth. ? For a list of hotlines in [place], visit ParkingAffiliatePrograms.se.html  Contact a crisis center or a local suicide prevention center. To find a crisis center or suicide prevention center: ? Call your local hospital, clinic, community service organization, mental health center, social service provider, or health department. Ask for help with connecting to a crisis center. ? For a list of crisis centers in the [place], visit:  suicidepreventionlifeline.org ? For a list of crisis centers in [place], visit: suicideprevention.ca How to help yourself feel better   Promise yourself that you will not do anything extreme when you have suicidal feelings. Remember, there is hope. Many people have gotten through suicidal thoughts and feelings, and you can too. If you have had these feelings before, remind yourself that you can get through them again.  Let family, friends, teachers, or counselors know how you are feeling. Try not to separate yourself from those who care about you and want to help you. Talk with someone every day, even if you do not feel sociable. Face-to-face conversation is best to help them understand your feelings.  Contact a mental health care provider and work with this person regularly.  Make a safety plan that you can follow during a crisis. Include phone numbers of suicide prevention hotlines, mental health professionals, and trusted friends and family members you can call during an emergency. Save these numbers on your phone.  If you are thinking of taking a lot of medicine, give your medicine to someone who can give it to you as prescribed. If you are on antidepressants and are concerned you will overdose, tell your health care provider so that he or she can give you safer medicines.  Try to stick to your routines. Follow a schedule every day. Make self-care a priority.  Make a list of realistic goals, and cross them off when you achieve them. Accomplishments can give you a sense of worth.  Wait until you are feeling better before doing things that you find difficult or unpleasant.  Do things that you have always enjoyed to take your mind off your feelings. Try reading a book, or listening  to or playing music. Spending time outside, in nature, may help you feel better. Follow these instructions at home:   Visit your primary health care provider every year for a checkup.  Work with a mental health  care provider as needed.  Eat a well-balanced diet, and eat regular meals.  Get plenty of rest.  Exercise if you are able. Just 30 minutes of exercise each day can help you feel better.  Take over-the-counter and prescription medicines only as told by your health care provider. Ask your mental health care provider about the possible side effects of any medicines you are taking.  Do not use alcohol or drugs, and remove these substances from your home.  Remove weapons, poisons, knives, and other deadly items from your home. General recommendations  Keep your living space well lit.  When you are feeling well, write yourself a letter with tips and support that you can read when you are not feeling well.  Remember that life's difficulties can be sorted out with help. Conditions can be treated, and you can learn behaviors and ways of thinking that will help you. Where to find more information  National Suicide Prevention Lifeline: www.suicidepreventionlifeline.org  Hopeline: www.hopeline.[place] for Suicide Prevention: PromotionalLoans.co.za  The [employer] (for lesbian, gay, bisexual, transgender, or questioning youth): www.thetrevorproject.org Contact a health care provider if:  You feel as though you are a burden to others.  You feel agitated, angry, vengeful, or have extreme mood swings.  You have withdrawn from family and friends. Get help right away if:  You are talking about suicide or wishing to die.  You start making plans for how to commit suicide.  You feel that you have no reason to live.  You start making plans for putting your affairs in order, saying goodbye, or giving your possessions away.  You feel guilt, shame, or unbearable pain, and it seems like there is no way out.  You are frequently using drugs or alcohol.  You are engaging in risky behaviors that could lead to death. If you have any of these symptoms, get help right away. Call  emergency services, go to your nearest emergency department or crisis center, or call a suicide crisis helpline. Summary  Suicide is when you take your own life.  Promise yourself that you will not do anything extreme when you have suicidal feelings.  Let family, friends, teachers, or counselors know how you are feeling.  Get help right away if you feel as though life is getting too tough to handle and you are thinking about suicide. This information is not intended to replace advice given to you by your health care provider. Make sure you discuss any questions you have with your health care provider. Document Revised: 12/29/2018 Document Reviewed: 04/20/2017 Elsevier Patient Education  [employer].

## 2020-01-29 NOTE — Assessment & Plan Note (Signed)
no improvement with prozac. She has discontinue CBT sessions, reports suicide ideation but no plans or intention to act on thoughts. No hx of suicide attempt, no hx of inpatient [facility] admission.  She is able to contract for safety. Advised to Call 911 or go to ED if suicidal ideation become more pervasive. She verbalized understanding entered psychiatrist referral Continue prozac Start effexor F/up in 2weeks

## 2020-01-31 ENCOUNTER — Other Ambulatory Visit: Payer: 59

## 2020-02-02 LAB — VITAMIN D 1,25 DIHYDROXY
Vitamin D 1, 25 (OH)2 Total: 45 pg/mL (ref 18–72)
Vitamin D2 1, 25 (OH)2: <8 pg/mL
Vitamin D3 1, 25 (OH)2: 45 pg/mL

## 2020-02-16 ENCOUNTER — Encounter: Payer: Self-pay | Admitting: Nurse Practitioner

## 2020-02-16 ENCOUNTER — Telehealth (INDEPENDENT_AMBULATORY_CARE_PROVIDER_SITE_OTHER): Payer: 59 | Admitting: Nurse Practitioner

## 2020-02-16 DIAGNOSIS — F322 Major depressive disorder, single episode, severe without psychotic features: Secondary | ICD-10-CM

## 2020-02-16 MED ORDER — FLUOXETINE HCL 20 MG PO TABS: 40.0000 mg | ORAL_TABLET | Freq: Every day | ORAL | 3 refills | Status: DC

## 2020-02-16 NOTE — Progress Notes (Signed)
Virtual Visit via Video Note  I connected with@ on 02/16/20 at 10:00 AM EDT by a video enabled telemedicine application and verified that I am speaking with the correct person using two identifiers.  Location: Patient:Home Provider: Office Participants: patient and provider  I discussed the limitations of evaluation and management by telemedicine and the availability of in person appointments. I also discussed with the patient that there may be a patient responsible charge related to this service. The patient expressed understanding and agreed to proceed.  [id number] is connecting today to F/U with anxiety and depression. She states that she is doing a little bit better. The Effexor makes her heart feel like "butterfly" and she has to lay down in the evenings because of it. Discontinued the Effexor due to this but is still taking the Fluoxetine.   History of Present Illness: Has not schedule appt with therapist. Depression screen [facility] 2/9 02/16/2020 01/29/2020 01/29/2020  Decreased Interest 1 2 1   Down, Depressed, Hopeless 2 2 1   PHQ - 2 Score 3 4 2   Altered sleeping 0 3 -  Tired, decreased energy 2 2 -  Change in appetite 2 3 -  Feeling bad or failure about yourself  2 1 -  Trouble concentrating 2 2 -  Moving slowly or fidgety/restless 0 1 -  Suicidal thoughts 1 2 -  PHQ-9 Score 12 18 -  Difficult doing work/chores Extremely dIfficult - -   GAD 7 : Generalized Anxiety Score 02/16/2020 01/29/2020 10/26/2019 06/30/2019  Nervous, Anxious, on Edge 2 0 1 2  Control/stop worrying 2 1 0 1  Worry too much - different things 1 1 1 1   Trouble relaxing 1 2 0 1  Restless 1 0 0 0  Easily annoyed or irritable 2 1 1 1   Afraid - awful might happen 2 1 1 1   Total GAD 7 Score 11 6 4 7   Anxiety Difficulty Very difficult - - -   Observations/Objective: Physical Exam  Psychiatric: Her speech is normal. Thought content is not paranoid and not delusional. Cognition and memory are normal. She exhibits a  depressed mood. She expresses suicidal ideation. She expresses no homicidal ideation. She expresses no suicidal plans and no homicidal plans.   Assessment and Plan: [name] was seen today for depression.  Diagnoses and all orders for this visit:  Depression, major, single episode, severe (HCC) -     FLUoxetine (PROZAC) 20 MG tablet; Take 2 tablets (40 mg total) by mouth daily.   Follow Up Instructions: Increase fluoxetine to 40mg  Schedule appt with therapist You will be contacted to schedule appt with psychiatry. Advised to call 911 or go to hospital if SI worsen F/up in 87months   I discussed the assessment and treatment plan with the patient. The patient was provided an opportunity to ask questions and all were answered. The patient agreed with the plan and demonstrated an understanding of the instructions.   The patient was advised to call back or seek an in-person evaluation if the symptoms worsen or if the condition fails to improve as anticipated.  Wilfred Lacy, NP

## 2020-02-16 NOTE — Assessment & Plan Note (Signed)
Unable to tolerate effexor (palpitations) Increase fluoxetine to 40mg  Schedule appt with therapist You will be contacted to schedule appt with psychiatry. Advised to call 911 or go to hospital if SI worsen F/up in 10months

## 2020-04-15 ENCOUNTER — Ambulatory Visit: Payer: 59 | Admitting: Family Medicine

## 2020-04-15 ENCOUNTER — Encounter: Payer: Self-pay | Admitting: Family Medicine

## 2020-04-15 VITALS — BP 105/66 | HR 74 | Ht 66.0 in | Wt 224.4 lb

## 2020-04-15 DIAGNOSIS — G43709 Chronic migraine without aura, not intractable, without status migrainosus: Secondary | ICD-10-CM

## 2020-04-15 DIAGNOSIS — IMO0002 Reserved for concepts with insufficient information to code with codable children: Secondary | ICD-10-CM

## 2020-04-15 MED ORDER — RIZATRIPTAN BENZOATE 10 MG PO TBDP: 10.0000 mg | ORAL_TABLET | ORAL | 11 refills | Status: DC | PRN

## 2020-04-15 MED ORDER — TOPIRAMATE 50 MG PO TABS: 100.0000 mg | ORAL_TABLET | Freq: Two times a day (BID) | ORAL | 11 refills | Status: DC

## 2020-04-15 NOTE — Progress Notes (Signed)
PATIENT: [name] DOB: Jun 16, 1986  REASON FOR VISIT: follow up HISTORY FROM: patient  Chief Complaint  Patient presents with  . Follow-up    6 month f/u for migraines. States she has been stable since last visit.    Marland Kitchen room 5    alone     HISTORY OF PRESENT ILLNESS: Today 04/15/20 [name] is a 34 y.o. female here today for follow up for migraines. She continues topiramate 100mg  twice daily. She reports that headaches are well managed. She uses rizatriptan as needed. She reports having to use rizatriptan 1-2 times since last being seen. She reports headaches are usually aborted with Tylenol.   HISTORY: (copied from my note on 10/17/2019)  [name] is a 34 y.o. female here today for follow up. She has titrated topiramate to 100mg  twice daily. Headaches and migraines have resolved. She rarely uses rizatriptan. She has noted that it takes her a few minutes to wake up in the mornings. She feels that she is in a daze for about for 15 minutes. After getting out of bed and moving around, she feels fine. She has also noted a change in taste buds. She has stopped drinking sodas. She has lost about 11 pounds since 07/2019.   HISTORY: (copied from Dr Rhea Belton note on 07/06/2019)  [name] a 34 year old female, seen in request byher primary care nurse practitionerNche, Raymon Mutton evaluation of chronic migraine, initial evaluation was July 06, 2019.  I have reviewed and summarized the referring note from the referring physician.She reported a history of migraine since 34 years old, her typical migraine severe pounding headache, often preceded by tunnel vision, spreading numbness involving ipsilateral upper extremity, followed by severe pounding headache, light noise sensitivity, nauseous, lasting for few hours, she often have to sleep it off, also taking up to 1000 mg of over-the-counter Motrin. She used to have migraine only a couple times each year, but since April  2020, she complains of increased migraine headaches, couple times each week.  She was previously given prescription of Elavil 25 mg every night, which did help her headache but she complains of drowsiness, could not function well next morning    REVIEW OF SYSTEMS: Out of a complete 14 system review of symptoms, the patient complains only of the following symptoms, headaches and all other reviewed systems are negative.  ALLERGIES: No Known Allergies  HOME MEDICATIONS: Outpatient Medications Prior to Visit  Medication Sig Dispense Refill  . FLUoxetine (PROZAC) 20 MG tablet Take 2 tablets (40 mg total) by mouth daily. 180 tablet 3  . ondansetron (ZOFRAN ODT) 4 MG disintegrating tablet Take 1 tablet (4 mg total) by mouth every 8 (eight) hours as needed. 20 tablet 6  . rizatriptan (MAXALT-MLT) 10 MG disintegrating tablet Take 1 tablet (10 mg total) by mouth as needed. May repeat in 2 hours if needed 15 tablet 6  . topiramate (TOPAMAX) 50 MG tablet Take 2 tablets (100 mg total) by mouth 2 (two) times daily. 120 tablet 11   No facility-administered medications prior to visit.    PAST MEDICAL HISTORY: Past Medical History:  Diagnosis Date  . Chicken pox   . Headaches   . Hx of migraines   . Hx of varicella     PAST SURGICAL HISTORY: Past Surgical History:  Procedure Laterality Date  . CESAREAN SECTION  2003 & 2006  . CESAREAN SECTION N/A 04/30/2014   Procedure: REPEAT CESAREAN SECTION;  Surgeon: Allyn Kenner, DO;  Location: [facility];  Service: Obstetrics;  Laterality: N/A;  . CESAREAN SECTION N/A 05/15/2016   Procedure: CESAREAN SECTION;  Surgeon: Allyn Kenner, DO;  Location: [facility];  Service: Obstetrics;  Laterality: N/A;  . CHOLECYSTECTOMY    . TUBAL LIGATION Bilateral 05/15/2016   Procedure: BILATERAL TUBAL LIGATION;  Surgeon: Allyn Kenner, DO;  Location: [facility];  Service: Obstetrics;  Laterality: Bilateral;    FAMILY HISTORY: Family History    Problem Relation Age of Onset  . Diabetes Mother   . Hypertension Mother   . Alcohol abuse Mother   . Hyperlipidemia Mother   . Cancer Mother 7       breast cancer  . Hyperlipidemia Other   . Obesity Other   . Diabetes Maternal Aunt   . Hypertension Maternal Aunt   . Kidney disease Maternal Aunt   . Diabetes Paternal Aunt   . Hypertension Paternal Aunt   . Diabetes Maternal Grandmother   . Heart disease Maternal Grandmother   . Diabetes Paternal Grandfather   . Hypertension Maternal Grandfather     SOCIAL HISTORY: Social History   Socioeconomic History  . Marital status: Married    Spouse name: Not on file  . Number of children: 4  . Years of education: college  . Highest education level: Associate degree: academic program  Occupational History  . Not on file  Tobacco Use  . Smoking status: Never Smoker  . Smokeless tobacco: Never Used  Vaping Use  . Vaping Use: Never used  Substance and Sexual Activity  . Alcohol use: No  . Drug use: No  . Sexual activity: Yes    Birth control/protection: None  Other Topics Concern  . Not on file  Social History Narrative   Lives at home with her children.   Right-handed.   Drinks four cups caffeine daily.   Social Determinants of Health   Financial Resource Strain:   . Difficulty of Paying Living Expenses:   Food Insecurity:   . Worried About Charity fundraiser in the Last Year:   . Arboriculturist in the Last Year:   Transportation Needs:   . Film/video editor (Medical):   Marland Kitchen Lack of Transportation (Non-Medical):   Physical Activity:   . Days of Exercise per Week:   . Minutes of Exercise per Session:   Stress:   . Feeling of Stress :   Social Connections:   . Frequency of Communication with Friends and Family:   . Frequency of Social Gatherings with Friends and Family:   . Attends Religious Services:   . Active Member of Clubs or Organizations:   . Attends Archivist Meetings:   Marland Kitchen Marital  Status:   Intimate Partner Violence:   . Fear of Current or Ex-Partner:   . Emotionally Abused:   Marland Kitchen Physically Abused:   . Sexually Abused:       PHYSICAL EXAM  Vitals:   04/15/20 0915  BP: 105/66  Pulse: 74  Weight: (!) 224 lb 6.4 oz (101.8 kg)  Height: 5\' 6"  (1.676 m)   Body mass index is 36.22 kg/m.  Generalized: Well developed, in no acute distress  Cardiology: normal rate and rhythm, no murmur noted Respiratory: clear to auscultation bilaterally  Neurological examination  Mentation: Alert oriented to time, place, history taking. Follows all commands speech and language fluent Cranial nerve II-XII: Pupils were equal round reactive to light. Extraocular movements were full, visual field were full. Motor: The motor testing reveals 5 over  5 strength of all 4 extremities. Good symmetric motor tone is noted throughout.  Gait and station: Gait is normal.    DIAGNOSTIC DATA (LABS, IMAGING, TESTING) - I reviewed patient records, labs, notes, testing and imaging myself where available.  No flowsheet data found.   Lab Results  Component Value Date   WBC 6.0 01/29/2020   HGB 12.1 01/29/2020   HCT 37.4 01/29/2020   MCV 78.0 01/29/2020   PLT 250.0 01/29/2020      Component Value Date/Time   NA 137 01/29/2020 1046   K 3.8 01/29/2020 1046   CL 107 01/29/2020 1046   CO2 25 01/29/2020 1046   GLUCOSE 85 01/29/2020 1046   BUN 10 01/29/2020 1046   CREATININE 0.83 01/29/2020 1046   CALCIUM 9.0 01/29/2020 1046   PROT 7.1 01/29/2020 1046   ALBUMIN 4.3 01/29/2020 1046   AST 13 01/29/2020 1046   ALT 11 01/29/2020 1046   ALKPHOS 56 01/29/2020 1046   BILITOT 0.3 01/29/2020 1046   GFRNONAA >60 04/10/2017 2324   GFRAA >60 04/10/2017 2324   Lab Results  Component Value Date   CHOL 234 (H) 01/29/2020   HDL 50.90 01/29/2020   LDLCALC 167 (H) 01/29/2020   TRIG 83.0 01/29/2020   CHOLHDL 5 01/29/2020   Lab Results  Component Value Date   HGBA1C 5.2 01/29/2020   Lab Results   Component Value Date   BRAXENMM76 808 10/29/2016   Lab Results  Component Value Date   TSH 2.56 01/29/2020       ASSESSMENT AND PLAN 34 y.o. year old female  has a past medical history of Chicken pox, Headaches, migraines, and varicella. here with     ICD-10-CM   1. Chronic migraine  G43.709     Aphrodite continues to do well on topiramate 100mg  twice daily. We will continue rizatriptan for abortive therapy. She was encouraged to work on healthy lifestyle habits. She will follow up in 1 year, sooner if needed. She verbalizes understanding and agreement with this plan.    No orders of the defined types were placed in this encounter.    Meds ordered this encounter  Medications  . rizatriptan (MAXALT-MLT) 10 MG disintegrating tablet    Sig: Take 1 tablet (10 mg total) by mouth as needed. May repeat in 2 hours if needed    Dispense:  10 tablet    Refill:  11    Order Specific Question:   Supervising Provider    Answer:   Melvenia Beam [id number]  . topiramate (TOPAMAX) 50 MG tablet    Sig: Take 2 tablets (100 mg total) by mouth 2 (two) times daily.    Dispense:  120 tablet    Refill:  11    Order Specific Question:   Supervising Provider    Answer:   Melvenia Beam [id number]      I spent 15 minutes with the patient. 50% of this time was spent counseling and educating patient on plan of care and medications.    Debbora Presto, FNP-C 04/15/2020, 9:32 AM [facility] [address] [place], [place] [address] [phone]

## 2020-04-15 NOTE — Patient Instructions (Signed)
We will continue topiramate 100mg  twice daily. Continue rizatriptan as needed for abortive therapy.   Stay well hydrated. Eat regular meals. Try to exercise daily.   Follow up in 1 year   Migraine Headache A migraine headache is a very strong throbbing pain on one side or both sides of your head. This type of headache can also cause other symptoms. It can last from 4 hours to 3 days. Talk with your doctor about what things may bring on (trigger) this condition. What are the causes? The exact cause of this condition is not known. This condition may be triggered or caused by:  Drinking alcohol.  Smoking.  Taking medicines, such as: ? Medicine used to treat chest pain (nitroglycerin). ? Birth control pills. ? Estrogen. ? Some blood pressure medicines.  Eating or drinking certain products.  Doing physical activity. Other things that may trigger a migraine headache include:  Having a menstrual period.  Pregnancy.  Hunger.  Stress.  Not getting enough sleep or getting too much sleep.  Weather changes.  Tiredness (fatigue). What increases the risk?  Being 43-22 years old.  Being female.  Having a family history of migraine headaches.  Being Caucasian.  Having depression or anxiety.  Being very overweight. What are the signs or symptoms?  A throbbing pain. This pain may: ? Happen in any area of the head, such as on one side or both sides. ? Make it hard to do daily activities. ? Get worse with physical activity. ? Get worse around bright lights or loud noises.  Other symptoms may include: ? Feeling sick to your stomach (nauseous). ? Vomiting. ? Dizziness. ? Being sensitive to bright lights, loud noises, or smells.  Before you get a migraine headache, you may get warning signs (an aura). An aura may include: ? Seeing flashing lights or having blind spots. ? Seeing bright spots, halos, or zigzag lines. ? Having tunnel vision or blurred vision. ? Having  numbness or a tingling feeling. ? Having trouble talking. ? Having weak muscles.  Some people have symptoms after a migraine headache (postdromal phase), such as: ? Tiredness. ? Trouble thinking (concentrating). How is this treated?  Taking medicines that: ? Relieve pain. ? Relieve the feeling of being sick to your stomach. ? Prevent migraine headaches.  Treatment may also include: ? Having acupuncture. ? Avoiding foods that bring on migraine headaches. ? Learning ways to control your body functions (biofeedback). ? Therapy to help you know and deal with negative thoughts (cognitive behavioral therapy). Follow these instructions at home: Medicines  Take over-the-counter and prescription medicines only as told by your doctor.  Ask your doctor if the medicine prescribed to you: ? Requires you to avoid driving or using heavy machinery. ? Can cause trouble pooping (constipation). You may need to take these steps to prevent or treat trouble pooping:  Drink enough fluid to keep your pee (urine) pale yellow.  Take over-the-counter or prescription medicines.  Eat foods that are high in fiber. These include beans, whole grains, and fresh fruits and vegetables.  Limit foods that are high in fat and sugar. These include fried or sweet foods. Lifestyle  Do not drink alcohol.  Do not use any products that contain nicotine or tobacco, such as cigarettes, e-cigarettes, and chewing tobacco. If you need help quitting, ask your doctor.  Get at least 8 hours of sleep every night.  Limit and deal with stress. General instructions      Keep a journal to  find out what may bring on your migraine headaches. For example, write down: ? What you eat and drink. ? How much sleep you get. ? Any change in what you eat or drink. ? Any change in your medicines.  If you have a migraine headache: ? Avoid things that make your symptoms worse, such as bright lights. ? It may help to lie down in a  dark, quiet room. ? Do not drive or use heavy machinery. ? Ask your doctor what activities are safe for you.  Keep all follow-up visits as told by your doctor. This is important. Contact a doctor if:  You get a migraine headache that is different or worse than others you have had.  You have more than 15 headache days in one month. Get help right away if:  Your migraine headache gets very bad.  Your migraine headache lasts longer than 72 hours.  You have a fever.  You have a stiff neck.  You have trouble seeing.  Your muscles feel weak or like you cannot control them.  You start to lose your balance a lot.  You start to have trouble walking.  You pass out (faint).  You have a seizure. Summary  A migraine headache is a very strong throbbing pain on one side or both sides of your head. These headaches can also cause other symptoms.  This condition may be treated with medicines and changes to your lifestyle.  Keep a journal to find out what may bring on your migraine headaches.  Contact a doctor if you get a migraine headache that is different or worse than others you have had.  Contact your doctor if you have more than 15 headache days in a month. This information is not intended to replace advice given to you by your health care provider. Make sure you discuss any questions you have with your health care provider. Document Revised: 12/30/2018 Document Reviewed: 10/20/2018 Elsevier Patient Education  [place].   Topiramate tablets What is this medicine? TOPIRAMATE (toe PYRE a mate) is used to treat seizures in adults or children with epilepsy. It is also used for the prevention of migraine headaches. This medicine may be used for other purposes; ask your health care provider or pharmacist if you have questions. COMMON BRAND NAME(S): Topamax, Topiragen What should I tell my health care provider before I take this medicine? They need to know if you have any  of these conditions:  bleeding disorders  kidney disease  lung or breathing disease, like asthma  suicidal thoughts, plans, or attempt; a previous suicide attempt by you or a family member  an unusual or allergic reaction to topiramate, other medicines, foods, dyes, or preservatives  pregnant or trying to get pregnant  breast-feeding How should I use this medicine? Take this medicine by mouth with a glass of water. Follow the directions on the prescription label. Do not cut, crush or chew this medicine. Swallow the tablets whole. You can take it with or without food. If it upsets your stomach, take it with food. Take your medicine at regular intervals. Do not take it more often than directed. Do not stop taking except on your doctor's advice. A special MedGuide will be given to you by the pharmacist with each prescription and refill. Be sure to read this information carefully each time. Talk to your pediatrician regarding the use of this medicine in children. While this drug may be prescribed for children as young as 2 years of  age for selected conditions, precautions do apply. Overdosage: If you think you have taken too much of this medicine contact a poison control center or emergency room at once. NOTE: This medicine is only for you. Do not share this medicine with others. What if I miss a dose? If you miss a dose, take it as soon as you can. If your next dose is to be taken in less than 6 hours, then do not take the missed dose. Take the next dose at your regular time. Do not take double or extra doses. What may interact with this medicine? This medicine may interact with the following medications:  acetazolamide  alcohol  antihistamines for allergy, cough, and cold  aspirin and aspirin-like medicines  atropine  birth control pills  certain medicines for anxiety or sleep  certain medicines for bladder problems like oxybutynin, tolterodine  certain medicines for  depression like amitriptyline, fluoxetine, sertraline  certain medicines for seizures like carbamazepine, phenobarbital, phenytoin, primidone, valproic acid, zonisamide  certain medicines for stomach problems like dicyclomine, hyoscyamine  certain medicines for travel sickness like scopolamine  certain medicines for Parkinson's disease like benztropine, trihexyphenidyl  certain medicines that treat or prevent blood clots like warfarin, enoxaparin, dalteparin, apixaban, dabigatran, and rivaroxaban  digoxin  general anesthetics like halothane, isoflurane, methoxyflurane, propofol  hydrochlorothiazide  ipratropium  lithium  medicines that relax muscles for surgery  metformin  narcotic medicines for pain  NSAIDs, medicines for pain and inflammation, like ibuprofen or naproxen  phenothiazines like chlorpromazine, mesoridazine, prochlorperazine, thioridazine  pioglitazone This list may not describe all possible interactions. Give your health care provider a list of all the medicines, herbs, non-prescription drugs, or dietary supplements you use. Also tell them if you smoke, drink alcohol, or use illegal drugs. Some items may interact with your medicine. What should I watch for while using this medicine? Visit your doctor or health care professional for regular checks on your progress. Tell your health care professional if your symptoms do not start to get better or if they get worse. Do not stop taking except on your health care professional's advice. You may develop a severe reaction. Your health care professional will tell you how much medicine to take. Wear a medical ID bracelet or chain. Carry a card that describes your disease and details of your medicine and dosage times. This medicine can reduce the response of your body to heat or cold. Dress warm in cold weather and stay hydrated in hot weather. If possible, avoid extreme temperatures like saunas, hot tubs, very hot or cold  showers, or activities that can cause dehydration such as vigorous exercise. Check with your health care professional if you have severe diarrhea, nausea, and vomiting, or if you sweat a lot. The loss of too much body fluid may make it dangerous for you to take this medicine. You may get drowsy or dizzy. Do not drive, use machinery, or do anything that needs mental alertness until you know how this medicine affects you. Do not stand up or sit up quickly, especially if you are an older patient. This reduces the risk of dizzy or fainting spells. Alcohol may interfere with the effect of this medicine. Avoid alcoholic drinks. Tell your health care professional right away if you have any change in your eyesight. Patients and their families should watch out for new or worsening depression or thoughts of suicide. Also watch out for sudden changes in feelings such as feeling anxious, agitated, panicky, irritable, hostile, aggressive, impulsive, severely  restless, overly excited and hyperactive, or not being able to sleep. If this happens, especially at the beginning of treatment or after a change in dose, call your healthcare professional. This medicine may cause serious skin reactions. They can happen weeks to months after starting the medicine. Contact your health care provider right away if you notice fevers or flu-like symptoms with a rash. The rash may be red or purple and then turn into blisters or peeling of the skin. Or, you might notice a red rash with swelling of the face, lips or lymph nodes in your neck or under your arms. Birth control may not work properly while you are taking this medicine. Talk to your health care professional about using an extra method of birth control. Women should inform their health care professional if they wish to become pregnant or think they might be pregnant. There is a potential for serious side effects and harm to an unborn child. Talk to your health care professional for  more information. What side effects may I notice from receiving this medicine? Side effects that you should report to your doctor or health care professional as soon as possible:  allergic reactions like skin rash, itching or hives, swelling of the face, lips, or tongue  blood in the urine  changes in vision  confusion  loss of memory  pain in lower back or side  pain when urinating  redness, blistering, peeling or loosening of the skin, including inside the mouth  signs and symptoms of bleeding such as bloody or black, tarry stools; red or dark brown urine; spitting up blood or brown material that looks like coffee grounds; red spots on the skin; unusual bruising or bleeding from the eyes, gums, or nose  signs and symptoms of increased acid in the body like breathing fast; fast heartbeat; headache; confusion; unusually weak or tired; nausea, vomiting  suicidal thoughts, mood changes  trouble speaking or understanding  unusual sweating  unusually weak or tired Side effects that usually do not require medical attention (report to your doctor or health care professional if they continue or are bothersome):  dizziness  drowsiness  fever  loss of appetite  nausea, vomiting  pain, tingling, numbness in the hands or feet  stomach pain  tiredness  upset stomach This list may not describe all possible side effects. Call your doctor for medical advice about side effects. You may report side effects to FDA at 1-800-FDA-1088. Where should I keep my medicine? Keep out of the reach of children. Store at room temperature between 15 and 30 degrees C (59 and 86 degrees F). Throw away any unused medicine after the expiration date. NOTE: This sheet is a summary. It may not cover all possible information. If you have questions about this medicine, talk to your doctor, pharmacist, or health care provider.  2020 Elsevier/[employer] Standard (2019-04-06 15:07:20)

## 2020-07-16 ENCOUNTER — Ambulatory Visit: Payer: 59 | Admitting: Psychology

## 2020-07-16 ENCOUNTER — Ambulatory Visit (INDEPENDENT_AMBULATORY_CARE_PROVIDER_SITE_OTHER): Payer: 59 | Admitting: Psychology

## 2020-07-16 DIAGNOSIS — F322 Major depressive disorder, single episode, severe without psychotic features: Secondary | ICD-10-CM

## 2020-07-25 ENCOUNTER — Other Ambulatory Visit: Payer: Self-pay | Admitting: Nurse Practitioner

## 2020-07-25 DIAGNOSIS — F322 Major depressive disorder, single episode, severe without psychotic features: Secondary | ICD-10-CM

## 2020-08-02 ENCOUNTER — Ambulatory Visit (INDEPENDENT_AMBULATORY_CARE_PROVIDER_SITE_OTHER): Payer: 59 | Admitting: Psychology

## 2020-08-02 DIAGNOSIS — F900 Attention-deficit hyperactivity disorder, predominantly inattentive type: Secondary | ICD-10-CM | POA: Diagnosis not present

## 2020-08-02 DIAGNOSIS — F322 Major depressive disorder, single episode, severe without psychotic features: Secondary | ICD-10-CM

## 2020-08-13 ENCOUNTER — Ambulatory Visit: Payer: 59 | Admitting: Psychology

## 2020-08-13 ENCOUNTER — Ambulatory Visit (INDEPENDENT_AMBULATORY_CARE_PROVIDER_SITE_OTHER): Payer: 59 | Admitting: Psychology

## 2020-08-13 DIAGNOSIS — F322 Major depressive disorder, single episode, severe without psychotic features: Secondary | ICD-10-CM | POA: Diagnosis not present

## 2020-08-13 DIAGNOSIS — F900 Attention-deficit hyperactivity disorder, predominantly inattentive type: Secondary | ICD-10-CM

## 2020-08-28 ENCOUNTER — Ambulatory Visit (INDEPENDENT_AMBULATORY_CARE_PROVIDER_SITE_OTHER): Payer: 59 | Admitting: Psychology

## 2020-08-28 DIAGNOSIS — F900 Attention-deficit hyperactivity disorder, predominantly inattentive type: Secondary | ICD-10-CM | POA: Diagnosis not present

## 2020-08-28 DIAGNOSIS — F322 Major depressive disorder, single episode, severe without psychotic features: Secondary | ICD-10-CM | POA: Diagnosis not present

## 2020-09-10 ENCOUNTER — Ambulatory Visit (INDEPENDENT_AMBULATORY_CARE_PROVIDER_SITE_OTHER): Payer: 59 | Admitting: Psychology

## 2020-09-10 DIAGNOSIS — F900 Attention-deficit hyperactivity disorder, predominantly inattentive type: Secondary | ICD-10-CM | POA: Diagnosis not present

## 2020-09-10 DIAGNOSIS — F332 Major depressive disorder, recurrent severe without psychotic features: Secondary | ICD-10-CM | POA: Diagnosis not present

## 2020-09-26 ENCOUNTER — Ambulatory Visit (INDEPENDENT_AMBULATORY_CARE_PROVIDER_SITE_OTHER): Payer: 59 | Admitting: Psychology

## 2020-09-26 DIAGNOSIS — F909 Attention-deficit hyperactivity disorder, unspecified type: Secondary | ICD-10-CM | POA: Diagnosis not present

## 2020-09-26 DIAGNOSIS — F322 Major depressive disorder, single episode, severe without psychotic features: Secondary | ICD-10-CM | POA: Diagnosis not present

## 2020-10-09 ENCOUNTER — Ambulatory Visit: Payer: 59 | Admitting: Psychology

## 2020-10-14 ENCOUNTER — Ambulatory Visit (INDEPENDENT_AMBULATORY_CARE_PROVIDER_SITE_OTHER): Payer: 59 | Admitting: Psychology

## 2020-10-14 DIAGNOSIS — F322 Major depressive disorder, single episode, severe without psychotic features: Secondary | ICD-10-CM

## 2020-10-14 DIAGNOSIS — F900 Attention-deficit hyperactivity disorder, predominantly inattentive type: Secondary | ICD-10-CM | POA: Diagnosis not present

## 2020-10-31 ENCOUNTER — Ambulatory Visit (INDEPENDENT_AMBULATORY_CARE_PROVIDER_SITE_OTHER): Payer: 59 | Admitting: Psychology

## 2020-10-31 DIAGNOSIS — F322 Major depressive disorder, single episode, severe without psychotic features: Secondary | ICD-10-CM

## 2020-10-31 DIAGNOSIS — F900 Attention-deficit hyperactivity disorder, predominantly inattentive type: Secondary | ICD-10-CM

## 2020-11-09 ENCOUNTER — Emergency Department (HOSPITAL_COMMUNITY)
Admission: EM | Admit: 2020-11-09 | Discharge: 2020-11-10 | Disposition: A | Discharge status: Home / Self Care | Payer: 59 | Attending: Emergency Medicine | Admitting: Emergency Medicine

## 2020-11-09 ENCOUNTER — Other Ambulatory Visit: Payer: Self-pay

## 2020-11-09 ENCOUNTER — Encounter (HOSPITAL_COMMUNITY): Payer: Self-pay | Admitting: Emergency Medicine

## 2020-11-09 DIAGNOSIS — R1031 Right lower quadrant pain: Secondary | ICD-10-CM | POA: Diagnosis present

## 2020-11-09 DIAGNOSIS — R110 Nausea: Secondary | ICD-10-CM | POA: Insufficient documentation

## 2020-11-09 DIAGNOSIS — D259 Leiomyoma of uterus, unspecified: Secondary | ICD-10-CM | POA: Diagnosis not present

## 2020-11-09 DIAGNOSIS — Z5321 Procedure and treatment not carried out due to patient leaving prior to being seen by health care provider: Secondary | ICD-10-CM | POA: Insufficient documentation

## 2020-11-09 LAB — URINALYSIS, ROUTINE W REFLEX MICROSCOPIC
Bacteria, UA: NONE SEEN
Bilirubin Urine: NEGATIVE
Glucose, UA: NEGATIVE mg/dL
Ketones, ur: NEGATIVE mg/dL
Leukocytes,Ua: NEGATIVE
Nitrite: NEGATIVE
Protein, ur: NEGATIVE mg/dL
RBC / HPF: >50 RBC/hpf — ABNORMAL HIGH (ref 0–5)
Specific Gravity, Urine: 1.029 (ref 1.005–1.030)
pH: 7 (ref 5.0–8.0)

## 2020-11-09 LAB — I-STAT BETA HCG BLOOD, ED (MC, WL, AP ONLY): I-stat hCG, quantitative: <5 m[IU]/mL (ref <5)

## 2020-11-09 LAB — LIPASE, BLOOD: Lipase: 30 U/L (ref 11–51)

## 2020-11-09 LAB — CBC
HCT: 37.3 % (ref 36.0–46.0)
Hemoglobin: 11.5 g/dL — ABNORMAL LOW (ref 12.0–15.0)
MCH: 24.1 pg — ABNORMAL LOW (ref 26.0–34.0)
MCHC: 30.8 g/dL (ref 30.0–36.0)
MCV: 78.2 fL — ABNORMAL LOW (ref 80.0–100.0)
Platelets: 263 10*3/uL (ref 150–400)
RBC: 4.77 MIL/uL (ref 3.87–5.11)
RDW: 14 % (ref 11.5–15.5)
WBC: 9.9 10*3/uL (ref 4.0–10.5)
nRBC: 0 % (ref 0.0–0.2)

## 2020-11-09 LAB — COMPREHENSIVE METABOLIC PANEL
ALT: 17 U/L (ref 0–44)
AST: 18 U/L (ref 15–41)
Albumin: 3.8 g/dL (ref 3.5–5.0)
Alkaline Phosphatase: 43 U/L (ref 38–126)
Anion gap: 9 (ref 5–15)
BUN: 8 mg/dL (ref 6–20)
CO2: 25 mmol/L (ref 22–32)
Calcium: 9.3 mg/dL (ref 8.9–10.3)
Chloride: 105 mmol/L (ref 98–111)
Creatinine, Ser: 0.76 mg/dL (ref 0.44–1.00)
GFR, Estimated: >60 mL/min (ref >60)
Glucose, Bld: 105 mg/dL — ABNORMAL HIGH (ref 70–99)
Potassium: 3.9 mmol/L (ref 3.5–5.1)
Sodium: 139 mmol/L (ref 135–145)
Total Bilirubin: 0.2 mg/dL — ABNORMAL LOW (ref 0.3–1.2)
Total Protein: 6.7 g/dL (ref 6.5–8.1)

## 2020-11-09 NOTE — ED Triage Notes (Signed)
Pt reports RLQ pain since this morning with nausea.  Denies vomiting and diarrhea.

## 2020-11-10 ENCOUNTER — Emergency Department (HOSPITAL_COMMUNITY): Payer: 59

## 2020-11-10 ENCOUNTER — Encounter (HOSPITAL_COMMUNITY): Payer: Self-pay

## 2020-11-10 ENCOUNTER — Emergency Department (HOSPITAL_COMMUNITY)
Admission: EM | Admit: 2020-11-10 | Discharge: 2020-11-11 | Disposition: A | Discharge status: Home / Self Care | Payer: 59 | Source: Home / Self Care | Attending: Emergency Medicine | Admitting: Emergency Medicine

## 2020-11-10 DIAGNOSIS — D259 Leiomyoma of uterus, unspecified: Secondary | ICD-10-CM

## 2020-11-10 LAB — CBC
HCT: 36.7 % (ref 36.0–46.0)
Hemoglobin: 11.2 g/dL — ABNORMAL LOW (ref 12.0–15.0)
MCH: 24.1 pg — ABNORMAL LOW (ref 26.0–34.0)
MCHC: 30.5 g/dL (ref 30.0–36.0)
MCV: 79.1 fL — ABNORMAL LOW (ref 80.0–100.0)
Platelets: 253 10*3/uL (ref 150–400)
RBC: 4.64 MIL/uL (ref 3.87–5.11)
RDW: 14.1 % (ref 11.5–15.5)
WBC: 10.1 10*3/uL (ref 4.0–10.5)
nRBC: 0 % (ref 0.0–0.2)

## 2020-11-10 LAB — COMPREHENSIVE METABOLIC PANEL
ALT: 15 U/L (ref 0–44)
AST: 17 U/L (ref 15–41)
Albumin: 4 g/dL (ref 3.5–5.0)
Alkaline Phosphatase: 46 U/L (ref 38–126)
Anion gap: 9 (ref 5–15)
BUN: 7 mg/dL (ref 6–20)
CO2: 26 mmol/L (ref 22–32)
Calcium: 8.9 mg/dL (ref 8.9–10.3)
Chloride: 103 mmol/L (ref 98–111)
Creatinine, Ser: 0.78 mg/dL (ref 0.44–1.00)
GFR, Estimated: >60 mL/min (ref >60)
Glucose, Bld: 121 mg/dL — ABNORMAL HIGH (ref 70–99)
Potassium: 3.3 mmol/L — ABNORMAL LOW (ref 3.5–5.1)
Sodium: 138 mmol/L (ref 135–145)
Total Bilirubin: 0.7 mg/dL (ref 0.3–1.2)
Total Protein: 7.4 g/dL (ref 6.5–8.1)

## 2020-11-10 LAB — LIPASE, BLOOD: Lipase: 32 U/L (ref 11–51)

## 2020-11-10 LAB — I-STAT BETA HCG BLOOD, ED (MC, WL, AP ONLY): I-stat hCG, quantitative: <5 m[IU]/mL (ref <5)

## 2020-11-10 MED ORDER — MORPHINE SULFATE (PF) 4 MG/ML IV SOLN
4.0000 mg | Freq: Once | INTRAVENOUS | Status: AC
  Administered 2020-11-10: 4 mg via INTRAVENOUS
  Filled 2020-11-10: qty 1

## 2020-11-10 MED ORDER — KETOROLAC TROMETHAMINE 30 MG/ML IJ SOLN
30.0000 mg | Freq: Once | INTRAMUSCULAR | Status: AC
  Administered 2020-11-10: 30 mg via INTRAVENOUS
  Filled 2020-11-10: qty 1

## 2020-11-10 MED ORDER — IOHEXOL 300 MG/ML  SOLN
100.0000 mL | Freq: Once | INTRAMUSCULAR | Status: AC | PRN
  Administered 2020-11-10: 100 mL via INTRAVENOUS

## 2020-11-10 MED ORDER — ONDANSETRON HCL 4 MG/2ML IJ SOLN
4.0000 mg | Freq: Once | INTRAMUSCULAR | Status: AC
  Administered 2020-11-10: 4 mg via INTRAVENOUS
  Filled 2020-11-10: qty 2

## 2020-11-10 MED ORDER — NAPROXEN 375 MG PO TABS: 375.0000 mg | ORAL_TABLET | Freq: Two times a day (BID) | ORAL | 0 refills | Status: AC | PRN

## 2020-11-10 MED ORDER — ONDANSETRON 4 MG PO TBDP: 4.0000 mg | ORAL_TABLET | Freq: Three times a day (TID) | ORAL | 0 refills | Status: DC | PRN

## 2020-11-10 NOTE — ED Triage Notes (Signed)
Pt presents with c/o right lower quad abdominal pain since yesterday. Pt also reports some nausea as well.

## 2020-11-10 NOTE — Discharge Instructions (Addendum)
CT scan today shows shows 9 x 8 cm degenerative fibroid.  -You need to follow-up with your gynecologist.  I given you the office information and discharge paperwork.  You should call the office tomorrow to schedule the next available appointment for further evaluation of your fibroid.   -Prescription sent to your pharmacy for naproxen.  This is an anti-inflammatory medication.  Take it with food so it does not cause an upset stomach.  You should not take any additional anti-inflammatory medicine such as Aleve, ibuprofen, Motrin as these are all the same family.  If you continue to have pain you can take Tylenol.  -Prescription sent to the pharmacy for Zofran as well.  This is a nausea medicine.  Return to the emergency department if you have any new or worsening symptoms.

## 2020-11-10 NOTE — ED Provider Notes (Signed)
[facility] DEPT Provider Note   CSN: [id number] Arrival date & time: 11/10/20  1219     History Chief Complaint  Patient presents with  . Abdominal Pain    [name] is a 35 y.o. female with noncontributory past medical history. Abdominal surgical history includes cholecystectomy and cesarean section x 4.  HPI Presents to emergency room today with chief complaint of right lower quadrant abdominal pain x2 days.  She is endorsing associated nausea without emesis.  She states the pain has been constant.  She describes it as cramping and throbbing sensation.  Pain will occasionally radiate around to her back.  She rates pain 8 out of 10 in severity.  She denies history of similar pain.  She states she is currently on her menstrual cycle and this does not feel like menstrual cramps.  She admits that her periods have been heavier the last several months.  She took Tylenol without symptom improvement.  Denies fever, chills, shortness of breath, chest pain, back pain, vaginal discharge, urinary symptoms, diarrhea.  She is sexually active with 1 female partner.  Not concerned for STIs.      Past Medical History:  Diagnosis Date  . Chicken pox   . Headaches   . Hx of migraines   . Hx of varicella     Patient Active Problem List   Diagnosis Date Noted  . Chronic migraine 07/06/2019  . Carpal tunnel syndrome 05/18/2019  . History of cesarean section 05/18/2019  . History of gestational diabetes mellitus 05/18/2019  . Rectal pain 05/18/2019  . S/P tubal ligation 05/18/2019  . Migraine with aura and without status migrainosus, not intractable 05/18/2019  . Depression, major, single episode, severe ([place]) 05/18/2019  . Abnormal peripheral vision of both eyes 05/18/2019  . Paresthesia of both lower extremities 10/29/2016  . Class 2 severe obesity due to excess calories with serious comorbidity and body mass index (BMI) of 36.0 to 36.9 in adult ([place]) 10/23/2015   . Bell's palsy 04/21/2014    Past Surgical History:  Procedure Laterality Date  . CESAREAN SECTION  2003 & 2006  . CESAREAN SECTION N/A 04/30/2014   Procedure: REPEAT CESAREAN SECTION;  Surgeon: Allyn Kenner, DO;  Location: [facility];  Service: Obstetrics;  Laterality: N/A;  . CESAREAN SECTION N/A 05/15/2016   Procedure: CESAREAN SECTION;  Surgeon: Allyn Kenner, DO;  Location: [facility];  Service: Obstetrics;  Laterality: N/A;  . CHOLECYSTECTOMY    . TUBAL LIGATION Bilateral 05/15/2016   Procedure: BILATERAL TUBAL LIGATION;  Surgeon: Allyn Kenner, DO;  Location: [facility];  Service: Obstetrics;  Laterality: Bilateral;     OB History    Gravida  4   Para  4   Term  2   Preterm      AB      Living  4     SAB      IAB      Ectopic      Multiple  0   Live Births  2           Family History  Problem Relation Age of Onset  . Diabetes Mother   . Hypertension Mother   . Alcohol abuse Mother   . Hyperlipidemia Mother   . Cancer Mother 75       breast cancer  . Hyperlipidemia Other   . Obesity Other   . Diabetes Maternal Aunt   . Hypertension Maternal Aunt   . Kidney disease Maternal  Aunt   . Diabetes Paternal Aunt   . Hypertension Paternal Aunt   . Diabetes Maternal Grandmother   . Heart disease Maternal Grandmother   . Diabetes Paternal Grandfather   . Hypertension Maternal Grandfather     Social History   Tobacco Use  . Smoking status: Never Smoker  . Smokeless tobacco: Never Used  Vaping Use  . Vaping Use: Never used  Substance Use Topics  . Alcohol use: No  . Drug use: No    Home Medications Prior to Admission medications   Medication Sig Start Date End Date Taking? Authorizing Provider  naproxen (NAPROSYN) 375 MG tablet Take 1 tablet (375 mg total) by mouth 2 (two) times daily as needed for up to 10 days. 11/10/20 11/20/20 Yes Walisiewicz, Collin Hendley E, PA-C  ondansetron (ZOFRAN ODT) 4 MG disintegrating tablet Take 1 tablet  (4 mg total) by mouth every 8 (eight) hours as needed for nausea or vomiting. 11/10/20  Yes Walisiewicz, Aliani Caccavale E, PA-C  FLUoxetine (PROZAC) 20 MG tablet Take 2 tablets (40 mg total) by mouth daily. 02/16/20   Nche, Charlene Brooke, NP  rizatriptan (MAXALT-MLT) 10 MG disintegrating tablet Take 1 tablet (10 mg total) by mouth as needed. May repeat in 2 hours if needed 04/15/20   Lomax, Amy, NP  topiramate (TOPAMAX) 50 MG tablet Take 2 tablets (100 mg total) by mouth 2 (two) times daily. 04/15/20   Debbora Presto, NP    Allergies    Patient has no known allergies.  Review of Systems   Review of Systems All other systems are reviewed and are negative for acute change except as noted in the HPI.  Physical Exam Updated Vital Signs BP 125/72   Pulse 74   Temp 99 F (37.2 C) (Oral)   Resp 18   LMP 11/07/2020   SpO2 99%   Physical Exam Vitals and nursing note reviewed.  Constitutional:      General: She is not in acute distress.    Appearance: She is not ill-appearing.  HENT:     Head: Normocephalic and atraumatic.     Right Ear: Tympanic membrane and external ear normal.     Left Ear: Tympanic membrane and external ear normal.     Nose: Nose normal.     Mouth/Throat:     Mouth: Mucous membranes are moist.     Pharynx: Oropharynx is clear.  Eyes:     General: No scleral icterus.       Right eye: No discharge.        Left eye: No discharge.     Extraocular Movements: Extraocular movements intact.     Conjunctiva/sclera: Conjunctivae normal.     Pupils: Pupils are equal, round, and reactive to light.  Neck:     Vascular: No JVD.  Cardiovascular:     Rate and Rhythm: Normal rate and regular rhythm.     Pulses: Normal pulses.          Radial pulses are 2+ on the right side and 2+ on the left side.     Heart sounds: Normal heart sounds.  Pulmonary:     Comments: Lungs clear to auscultation in all fields. Symmetric chest rise. No wheezing, rales, or rhonchi. Abdominal:     Tenderness:  There is no abdominal tenderness.     Comments: Abdomen is soft, non-distended. Tender to palpation right upper quadrant, C pubic region and left lower quadrant with voluntary guarding.  No rigidity, no guarding. No peritoneal signs.  Genitourinary:  Comments: Patient defers pelvic exam Musculoskeletal:        General: Normal range of motion.     Cervical back: Normal range of motion.  Skin:    General: Skin is warm and dry.     Capillary Refill: Capillary refill takes less than 2 seconds.  Neurological:     Mental Status: She is oriented to person, place, and time.     GCS: GCS eye subscore is 4. GCS verbal subscore is 5. GCS motor subscore is 6.     Comments: Fluent speech, no facial droop.  Psychiatric:        Behavior: Behavior normal.     ED Results / Procedures / Treatments   Labs (all labs ordered are listed, but only abnormal results are displayed) Labs Reviewed  COMPREHENSIVE METABOLIC PANEL - Abnormal; Notable for the following components:      Result Value   Potassium 3.3 (*)    Glucose, Bld 121 (*)    All other components within normal limits  CBC - Abnormal; Notable for the following components:   Hemoglobin 11.2 (*)    MCV 79.1 (*)    MCH 24.1 (*)    All other components within normal limits  LIPASE, BLOOD  I-STAT BETA HCG BLOOD, ED ([facility], [facility], [facility] ONLY)    EKG None  Radiology CT ABDOMEN PELVIS W CONTRAST  Result Date: 11/10/2020 CLINICAL DATA:  35 year old female with right lower quadrant abdominal pain. EXAM: CT ABDOMEN AND PELVIS WITH CONTRAST TECHNIQUE: Multidetector CT imaging of the abdomen and pelvis was performed using the standard protocol following bolus administration of intravenous contrast. CONTRAST:  148mL OMNIPAQUE IOHEXOL 300 MG/ML  SOLN COMPARISON:  CT abdomen pelvis dated 11/06/2014. FINDINGS: Lower chest: The visualized lung bases are clear. No intra-abdominal free air.  Small free fluid in the pelvis. Hepatobiliary: The liver is unremarkable.  No intrahepatic biliary dilatation. Cholecystectomy. No retained calcified stone noted in the central CBD. Pancreas: Unremarkable. No pancreatic ductal dilatation or surrounding inflammatory changes. Spleen: Normal in size without focal abnormality. Adrenals/Urinary Tract: The adrenal glands unremarkable. There is no hydronephrosis on either side. The visualized ureters and urinary bladder appear unremarkable. Stomach/Bowel: There is no bowel obstruction or active inflammation. The appendix is normal. Vascular/Lymphatic: The abdominal aorta and IVC unremarkable. No portal venous gas. There is no adenopathy. Reproductive: The uterus is anteverted. There is a 9.2 x 8.5 cm low attenuating lesion in the right uterine body/fundus with mass effect and displacement of the endometrium. This most likely represents a degenerative fibroid with necrotic content. Further initial evaluation with pelvic ultrasound recommended. MRI may provide better evaluation depending on sonographic findings and due to large size of the lesion. Bilateral tubal ligation clips noted. Other: There is diastasis of anterior abdominal wall musculature in the midline with a broad-base hernia. Musculoskeletal: No acute or significant osseous findings. IMPRESSION: Probable large degenerative fibroid in the right upper uterus. Further evaluation with ultrasound or MRI is recommended. Electronically Signed   By: Anner Crete M.D.   On: 11/10/2020 22:31    Procedures Procedures   Medications Ordered in ED Medications  ketorolac (TORADOL) 30 MG/ML injection 30 mg (has no administration in time range)  morphine 4 MG/ML injection 4 mg (4 mg Intravenous Given 11/10/20 2125)  ondansetron (ZOFRAN) injection 4 mg (4 mg Intravenous Given 11/10/20 2123)  iohexol (OMNIPAQUE) 300 MG/ML solution 100 mL (100 mLs Intravenous Contrast Given 11/10/20 2211)    ED Course  I have reviewed the triage vital  signs and the nursing notes.  Pertinent labs &  imaging results that were available during my care of the patient were reviewed by me and considered in my medical decision making (see chart for details).    MDM Rules/Calculators/A&P                          History provided by patient with additional history obtained from chart review.    Presenting with RLQ abdominal pain.  She is afebrile, hemodynamically stable.  On my exam she is uncomfortable appearing although does not look to be septic.  She has right lower quadrant, suprapubic and left lower quadrant tenderness without peritoneal signs.  CBC without leukocytosis, hemoglobin 11.2 chills consistent with her baseline.  CMP overall unremarkable and lipase within normal range.  Pregnancy test is negative. Given analgesic and antiemetic. With her pain she asks to hold off on pelvic exam until CT results. On reassessment pain has significantly improved. Serial abdominal exams without peritoneal signs. CT AP shows 9.2 x 8.5 cm low attenuating lesion in the right uterine body/fundus with mass effect and displacement of the endometrium. This most likely represents a degenerative fibroid with necrotic content. With cause of pain found patient politely defers to have pelvic exam, I feel this is reasonable. She is tolerating PO intake without difficulty. She is patient of [facility], consulted on call provider Dr. Harrington Challenger who recommends NSAID for pain control and patient can follow-up outpatient for further evaluation of the fibroid. Patient is agreeable with this plan of care.  She was given IV Toradol and prescription for naproxen sent to outpatient pharmacy.  The patient appears reasonably screened and/or stabilized for discharge and I doubt any other medical condition or other [facility] requiring further screening, evaluation, or treatment in the ED at this time prior to discharge. The patient is safe for discharge with strict return precautions discussed.    Portions of this note were generated with  [employer]. Dictation errors may occur despite best attempts at proofreading.   Final Clinical Impression(s) / ED Diagnoses Final diagnoses:  Uterine leiomyoma, unspecified location    Rx / DC Orders ED Discharge Orders         Ordered    naproxen (NAPROSYN) 375 MG tablet  2 times daily PRN        11/10/20 2305    ondansetron (ZOFRAN ODT) 4 MG disintegrating tablet  Every 8 hours PRN        11/10/20 2305           Barrie Folk, PA-C 11/10/20 2346    Milton Ferguson, MD 11/11/20 1624

## 2020-11-10 NOTE — ED Notes (Signed)
Pt given water for PO challenge 

## 2020-11-10 NOTE — ED Notes (Signed)
Patient states she has been here a very long time and needs to leave

## 2020-11-14 ENCOUNTER — Ambulatory Visit (INDEPENDENT_AMBULATORY_CARE_PROVIDER_SITE_OTHER): Payer: 59 | Admitting: Psychology

## 2020-11-14 DIAGNOSIS — F322 Major depressive disorder, single episode, severe without psychotic features: Secondary | ICD-10-CM

## 2020-11-14 DIAGNOSIS — F900 Attention-deficit hyperactivity disorder, predominantly inattentive type: Secondary | ICD-10-CM

## 2020-11-19 ENCOUNTER — Other Ambulatory Visit: Payer: Self-pay | Admitting: Obstetrics and Gynecology

## 2020-11-19 DIAGNOSIS — N920 Excessive and frequent menstruation with regular cycle: Secondary | ICD-10-CM | POA: Insufficient documentation

## 2020-11-19 DIAGNOSIS — N946 Dysmenorrhea, unspecified: Secondary | ICD-10-CM | POA: Insufficient documentation

## 2020-11-19 LAB — HM PAP SMEAR

## 2020-11-19 LAB — RESULTS CONSOLE HPV: CHL HPV: NEGATIVE

## 2020-11-28 ENCOUNTER — Ambulatory Visit (INDEPENDENT_AMBULATORY_CARE_PROVIDER_SITE_OTHER): Payer: 59 | Admitting: Psychology

## 2020-11-28 DIAGNOSIS — F900 Attention-deficit hyperactivity disorder, predominantly inattentive type: Secondary | ICD-10-CM | POA: Diagnosis not present

## 2020-11-28 DIAGNOSIS — F322 Major depressive disorder, single episode, severe without psychotic features: Secondary | ICD-10-CM

## 2020-12-12 ENCOUNTER — Ambulatory Visit (INDEPENDENT_AMBULATORY_CARE_PROVIDER_SITE_OTHER): Payer: 59 | Admitting: Psychology

## 2020-12-12 DIAGNOSIS — F322 Major depressive disorder, single episode, severe without psychotic features: Secondary | ICD-10-CM | POA: Diagnosis not present

## 2020-12-12 DIAGNOSIS — F900 Attention-deficit hyperactivity disorder, predominantly inattentive type: Secondary | ICD-10-CM

## 2020-12-26 ENCOUNTER — Ambulatory Visit (INDEPENDENT_AMBULATORY_CARE_PROVIDER_SITE_OTHER): Payer: 59 | Admitting: Psychology

## 2020-12-26 DIAGNOSIS — F322 Major depressive disorder, single episode, severe without psychotic features: Secondary | ICD-10-CM | POA: Diagnosis not present

## 2020-12-26 DIAGNOSIS — F900 Attention-deficit hyperactivity disorder, predominantly inattentive type: Secondary | ICD-10-CM | POA: Diagnosis not present

## 2021-01-03 ENCOUNTER — Other Ambulatory Visit: Payer: Self-pay | Admitting: Nurse Practitioner

## 2021-01-09 ENCOUNTER — Ambulatory Visit (INDEPENDENT_AMBULATORY_CARE_PROVIDER_SITE_OTHER): Payer: 59 | Admitting: Psychology

## 2021-01-09 DIAGNOSIS — F322 Major depressive disorder, single episode, severe without psychotic features: Secondary | ICD-10-CM | POA: Diagnosis not present

## 2021-01-09 DIAGNOSIS — F900 Attention-deficit hyperactivity disorder, predominantly inattentive type: Secondary | ICD-10-CM | POA: Diagnosis not present

## 2021-01-23 ENCOUNTER — Ambulatory Visit (INDEPENDENT_AMBULATORY_CARE_PROVIDER_SITE_OTHER): Payer: 59 | Admitting: Psychology

## 2021-01-23 DIAGNOSIS — F909 Attention-deficit hyperactivity disorder, unspecified type: Secondary | ICD-10-CM

## 2021-01-23 DIAGNOSIS — F322 Major depressive disorder, single episode, severe without psychotic features: Secondary | ICD-10-CM

## 2021-02-06 ENCOUNTER — Ambulatory Visit (INDEPENDENT_AMBULATORY_CARE_PROVIDER_SITE_OTHER): Payer: 59 | Admitting: Psychology

## 2021-02-06 DIAGNOSIS — F322 Major depressive disorder, single episode, severe without psychotic features: Secondary | ICD-10-CM | POA: Diagnosis not present

## 2021-02-06 DIAGNOSIS — F909 Attention-deficit hyperactivity disorder, unspecified type: Secondary | ICD-10-CM

## 2021-02-12 ENCOUNTER — Other Ambulatory Visit: Payer: Self-pay | Admitting: Nurse Practitioner

## 2021-02-12 DIAGNOSIS — F322 Major depressive disorder, single episode, severe without psychotic features: Secondary | ICD-10-CM

## 2021-02-27 ENCOUNTER — Ambulatory Visit (INDEPENDENT_AMBULATORY_CARE_PROVIDER_SITE_OTHER): Payer: 59 | Admitting: Psychology

## 2021-02-27 DIAGNOSIS — F322 Major depressive disorder, single episode, severe without psychotic features: Secondary | ICD-10-CM | POA: Diagnosis not present

## 2021-02-27 DIAGNOSIS — F900 Attention-deficit hyperactivity disorder, predominantly inattentive type: Secondary | ICD-10-CM | POA: Diagnosis not present

## 2021-03-13 ENCOUNTER — Ambulatory Visit (INDEPENDENT_AMBULATORY_CARE_PROVIDER_SITE_OTHER): Payer: 59 | Admitting: Psychology

## 2021-03-13 DIAGNOSIS — F900 Attention-deficit hyperactivity disorder, predominantly inattentive type: Secondary | ICD-10-CM | POA: Diagnosis not present

## 2021-03-13 DIAGNOSIS — F322 Major depressive disorder, single episode, severe without psychotic features: Secondary | ICD-10-CM

## 2021-03-27 ENCOUNTER — Ambulatory Visit (INDEPENDENT_AMBULATORY_CARE_PROVIDER_SITE_OTHER): Payer: 59 | Admitting: Psychology

## 2021-03-27 DIAGNOSIS — F322 Major depressive disorder, single episode, severe without psychotic features: Secondary | ICD-10-CM

## 2021-03-27 DIAGNOSIS — F909 Attention-deficit hyperactivity disorder, unspecified type: Secondary | ICD-10-CM

## 2021-04-10 ENCOUNTER — Ambulatory Visit (INDEPENDENT_AMBULATORY_CARE_PROVIDER_SITE_OTHER): Payer: 59 | Admitting: Psychology

## 2021-04-10 DIAGNOSIS — F322 Major depressive disorder, single episode, severe without psychotic features: Secondary | ICD-10-CM

## 2021-04-10 DIAGNOSIS — F909 Attention-deficit hyperactivity disorder, unspecified type: Secondary | ICD-10-CM

## 2021-04-14 NOTE — Progress Notes (Deleted)
PATIENT: [name] DOB: 11-Jun-1986  REASON FOR VISIT: follow up HISTORY FROM: patient  No chief complaint on file.    HISTORY OF PRESENT ILLNESS: 04/15/2021 ALL: [name] returns for migraine follow up. She continues topiramate '100mg'$  BID and rizatriptan as needed.   04/15/2020 ALL:  [name] is a 35 y.o. female here today for follow up for migraines. She continues topiramate '100mg'$  twice daily. She reports that headaches are well managed. She uses rizatriptan as needed. She reports having to use rizatriptan 1-2 times since last being seen. She reports headaches are usually aborted with Tylenol.   HISTORY: (copied from my note on 10/17/2019)  [name] is a 35 y.o. female here today for follow up. She has titrated topiramate to '100mg'$  twice daily. Headaches and migraines have resolved. She rarely uses rizatriptan. She has noted that it takes her a few minutes to wake up in the mornings. She feels that she is in a daze for about for 15 minutes. After getting out of bed and moving around, she feels fine. She has also noted a change in taste buds. She has stopped drinking sodas. She has lost about 11 pounds since 07/2019.    HISTORY: (copied from Dr Rhea Belton note on 07/06/2019)   [name] is a 35 year old female, seen in request by her primary care nurse practitioner Flossie Buffy for evaluation of chronic migraine, initial evaluation was July 06, 2019.   I have reviewed and summarized the referring note from the referring physician.  She reported a history of migraine since 35 years old, her typical migraine severe pounding headache, often preceded by tunnel vision, spreading numbness involving ipsilateral upper extremity, followed by severe pounding headache, light noise sensitivity, nauseous, lasting for few hours, she often have to sleep it off, also taking up to 1000 mg of over-the-counter Motrin.  She used to have migraine only a couple times each year, but since April 2020,  she complains of increased migraine headaches, couple times each week.   She was previously given prescription of Elavil 25 mg every night, which did help her headache but she complains of drowsiness, could not function well next morning     REVIEW OF SYSTEMS: Out of a complete 14 system review of symptoms, the patient complains only of the following symptoms, headaches and all other reviewed systems are negative.  ALLERGIES: No Known Allergies  HOME MEDICATIONS: Outpatient Medications Prior to Visit  Medication Sig Dispense Refill   etonogestrel (NEXPLANON) 68 MG IMPL implant 1 each (68 mg total) by Subdermal route once for 1 dose. Administered 12/31/2020 1 each 0   FLUoxetine (PROZAC) 20 MG tablet Take 2 tablets (40 mg total) by mouth daily. Needs appt for additional refills 180 tablet 0   methylphenidate (CONCERTA) 54 MG PO CR tablet Take 1 tablet (54 mg total) by mouth every morning.  0   ondansetron (ZOFRAN ODT) 4 MG disintegrating tablet Take 1 tablet (4 mg total) by mouth every 8 (eight) hours as needed for nausea or vomiting. 12 tablet 0   rizatriptan (MAXALT-MLT) 10 MG disintegrating tablet Take 1 tablet (10 mg total) by mouth as needed. May repeat in 2 hours if needed 10 tablet 11   topiramate (TOPAMAX) 50 MG tablet Take 2 tablets (100 mg total) by mouth 2 (two) times daily. 120 tablet 11   No facility-administered medications prior to visit.    PAST MEDICAL HISTORY: Past Medical History:  Diagnosis Date   Chicken pox  Headaches    Hx of migraines    Hx of varicella     PAST SURGICAL HISTORY: Past Surgical History:  Procedure Laterality Date   CESAREAN SECTION  2003 & 2006   CESAREAN SECTION N/A 04/30/2014   Procedure: REPEAT CESAREAN SECTION;  Surgeon: Allyn Kenner, DO;  Location: [facility];  Service: Obstetrics;  Laterality: N/A;   CESAREAN SECTION N/A 05/15/2016   Procedure: CESAREAN SECTION;  Surgeon: Allyn Kenner, DO;  Location: [facility];  Service:  Obstetrics;  Laterality: N/A;   CHOLECYSTECTOMY     TUBAL LIGATION Bilateral 05/15/2016   Procedure: BILATERAL TUBAL LIGATION;  Surgeon: Allyn Kenner, DO;  Location: [facility];  Service: Obstetrics;  Laterality: Bilateral;    FAMILY HISTORY: Family History  Problem Relation Age of Onset   Diabetes Mother    Hypertension Mother    Alcohol abuse Mother    Hyperlipidemia Mother    Cancer Mother 55       breast cancer   Hyperlipidemia Other    Obesity Other    Diabetes Maternal Aunt    Hypertension Maternal Aunt    Kidney disease Maternal Aunt    Diabetes Paternal Aunt    Hypertension Paternal Aunt    Diabetes Maternal Grandmother    Heart disease Maternal Grandmother    Diabetes Paternal Grandfather    Hypertension Maternal Grandfather     SOCIAL HISTORY: Social History   Socioeconomic History   Marital status: Married    Spouse name: Not on file   Number of children: 4   Years of education: college   Highest education level: Associate degree: academic program  Occupational History   Not on file  Tobacco Use   Smoking status: Never   Smokeless tobacco: Never  Vaping Use   Vaping Use: Never used  Substance and Sexual Activity   Alcohol use: No   Drug use: No   Sexual activity: Yes    Birth control/protection: None  Other Topics Concern   Not on file  Social History Narrative   Lives at home with her children.   Right-handed.   Drinks four cups caffeine daily.   Social Determinants of Health   Financial Resource Strain: Not on file  Food Insecurity: Not on file  Transportation Needs: Not on file  Physical Activity: Not on file  Stress: Not on file  Social Connections: Not on file  Intimate Partner Violence: Not on file      PHYSICAL EXAM  There were no vitals filed for this visit.  There is no height or weight on file to calculate BMI.  Generalized: Well developed, in no acute distress  Cardiology: normal rate and rhythm, no murmur  noted Respiratory: clear to auscultation bilaterally  Neurological examination  Mentation: Alert oriented to time, place, history taking. Follows all commands speech and language fluent Cranial nerve II-XII: Pupils were equal round reactive to light. Extraocular movements were full, visual field were full. Motor: The motor testing reveals 5 over 5 strength of all 4 extremities. Good symmetric motor tone is noted throughout.  Gait and station: Gait is normal.    DIAGNOSTIC DATA (LABS, IMAGING, TESTING) - I reviewed patient records, labs, notes, testing and imaging myself where available.  No flowsheet data found.   Lab Results  Component Value Date   WBC 10.1 11/10/2020   HGB 11.2 (L) 11/10/2020   HCT 36.7 11/10/2020   MCV 79.1 (L) 11/10/2020   PLT 253 11/10/2020      Component  Value Date/Time   NA 138 11/10/2020 1713   K 3.3 (L) 11/10/2020 1713   CL 103 11/10/2020 1713   CO2 26 11/10/2020 1713   GLUCOSE 121 (H) 11/10/2020 1713   BUN 7 11/10/2020 1713   CREATININE 0.78 11/10/2020 1713   CALCIUM 8.9 11/10/2020 1713   PROT 7.4 11/10/2020 1713   ALBUMIN 4.0 11/10/2020 1713   AST 17 11/10/2020 1713   ALT 15 11/10/2020 1713   ALKPHOS 46 11/10/2020 1713   BILITOT 0.7 11/10/2020 1713   GFRNONAA >60 11/10/2020 1713   GFRAA >60 04/10/2017 2324   Lab Results  Component Value Date   CHOL 234 (H) 01/29/2020   HDL 50.90 01/29/2020   LDLCALC 167 (H) 01/29/2020   TRIG 83.0 01/29/2020   CHOLHDL 5 01/29/2020   Lab Results  Component Value Date   HGBA1C 5.2 01/29/2020   Lab Results  Component Value Date   VITAMINB12 474 10/29/2016   Lab Results  Component Value Date   TSH 2.56 01/29/2020       ASSESSMENT AND PLAN 35 y.o. year old female  has a past medical history of Chicken pox, Headaches, migraines, and varicella. here with   No diagnosis found.   Shaquasha continues to do well on topiramate '100mg'$  twice daily. We will continue rizatriptan for abortive therapy. She was  encouraged to work on healthy lifestyle habits. She will follow up in 1 year, sooner if needed. She verbalizes understanding and agreement with this plan.    No orders of the defined types were placed in this encounter.    No orders of the defined types were placed in this encounter.      Debbora Presto, FNP-C 04/14/2021, 4:35 PM [facility] [address] [place], [place] [address] [phone]

## 2021-04-15 ENCOUNTER — Encounter: Payer: Self-pay | Admitting: Family Medicine

## 2021-04-15 ENCOUNTER — Ambulatory Visit: Payer: 59 | Admitting: Family Medicine

## 2021-04-15 DIAGNOSIS — G43109 Migraine with aura, not intractable, without status migrainosus: Secondary | ICD-10-CM

## 2021-04-24 ENCOUNTER — Ambulatory Visit (INDEPENDENT_AMBULATORY_CARE_PROVIDER_SITE_OTHER): Payer: 59 | Admitting: Psychology

## 2021-04-24 DIAGNOSIS — F322 Major depressive disorder, single episode, severe without psychotic features: Secondary | ICD-10-CM

## 2021-04-24 DIAGNOSIS — F900 Attention-deficit hyperactivity disorder, predominantly inattentive type: Secondary | ICD-10-CM

## 2021-05-12 ENCOUNTER — Ambulatory Visit (INDEPENDENT_AMBULATORY_CARE_PROVIDER_SITE_OTHER): Payer: 59 | Admitting: Psychology

## 2021-05-12 DIAGNOSIS — F322 Major depressive disorder, single episode, severe without psychotic features: Secondary | ICD-10-CM

## 2021-05-12 DIAGNOSIS — F900 Attention-deficit hyperactivity disorder, predominantly inattentive type: Secondary | ICD-10-CM | POA: Diagnosis not present

## 2021-05-30 ENCOUNTER — Ambulatory Visit (INDEPENDENT_AMBULATORY_CARE_PROVIDER_SITE_OTHER): Payer: 59 | Admitting: Psychology

## 2021-05-30 DIAGNOSIS — F332 Major depressive disorder, recurrent severe without psychotic features: Secondary | ICD-10-CM | POA: Diagnosis not present

## 2021-05-30 DIAGNOSIS — F900 Attention-deficit hyperactivity disorder, predominantly inattentive type: Secondary | ICD-10-CM

## 2021-06-17 ENCOUNTER — Ambulatory Visit (INDEPENDENT_AMBULATORY_CARE_PROVIDER_SITE_OTHER): Payer: 59 | Admitting: Psychology

## 2021-06-17 DIAGNOSIS — F322 Major depressive disorder, single episode, severe without psychotic features: Secondary | ICD-10-CM

## 2021-06-17 DIAGNOSIS — F900 Attention-deficit hyperactivity disorder, predominantly inattentive type: Secondary | ICD-10-CM | POA: Diagnosis not present

## 2021-07-10 ENCOUNTER — Ambulatory Visit (INDEPENDENT_AMBULATORY_CARE_PROVIDER_SITE_OTHER): Payer: 59 | Admitting: Psychology

## 2021-07-10 DIAGNOSIS — F322 Major depressive disorder, single episode, severe without psychotic features: Secondary | ICD-10-CM

## 2021-07-10 DIAGNOSIS — F900 Attention-deficit hyperactivity disorder, predominantly inattentive type: Secondary | ICD-10-CM | POA: Diagnosis not present

## 2021-07-21 IMAGING — CT CT ABD-PELV W/ CM
2 of 4 series · 16 of 46 positions shown, 18 images · IV contrast (omnipaque)
Comparison: CT abdomen pelvis dated 11/06/2014.

CLINICAL DATA: 34-year-old female with right lower quadrant
abdominal pain.

EXAM:
CT ABDOMEN AND PELVIS WITH CONTRAST
TECHNIQUE: Multidetector CT imaging of the abdomen and pelvis was performed
using the standard protocol following bolus administration of
intravenous contrast.
CONTRAST:  100mL OMNIPAQUE IOHEXOL 300 MG/ML  SOLN

[Series 2: axial st · axial · 0.92mm/px · z∈[-479,-69]mm · 13 of 92 slices shown, 15 images]
[im 5/92  soft-tissue]
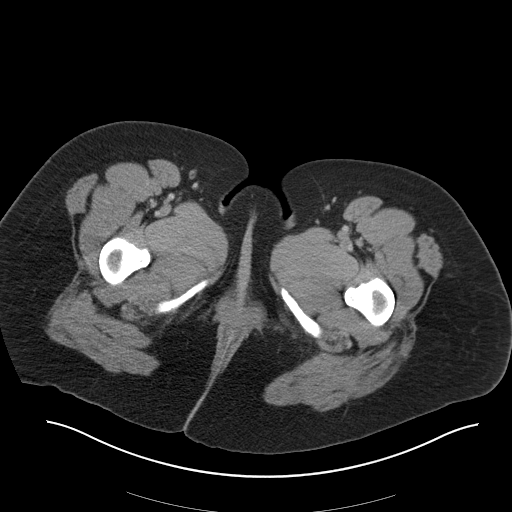
[im 5/92  bone]
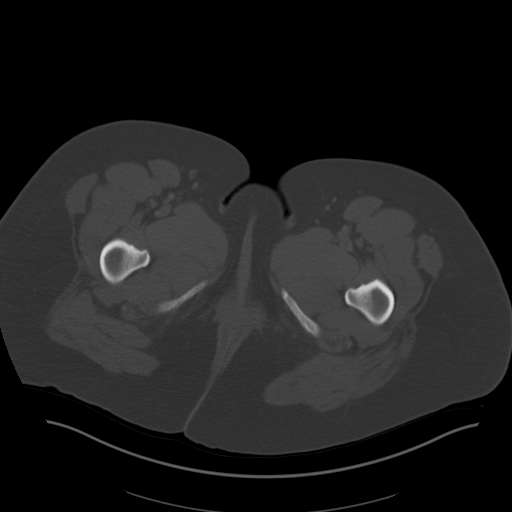
[im 15/92  soft-tissue]
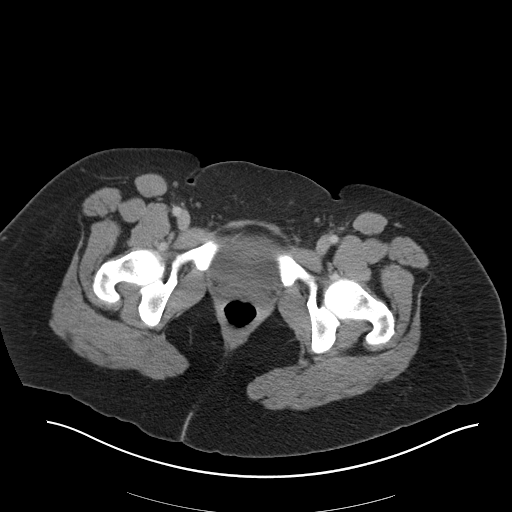
[im 20/92  soft-tissue]
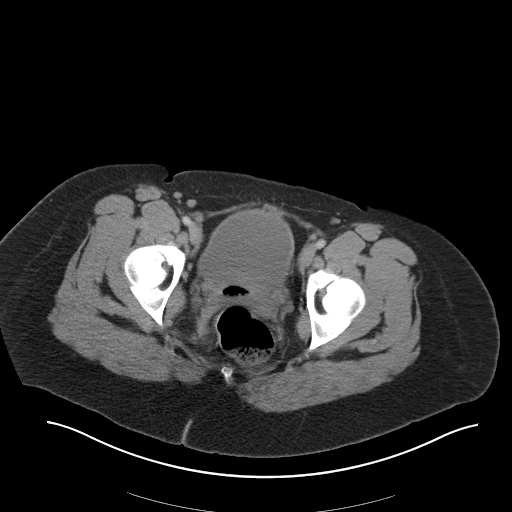
[im 24/92  soft-tissue]
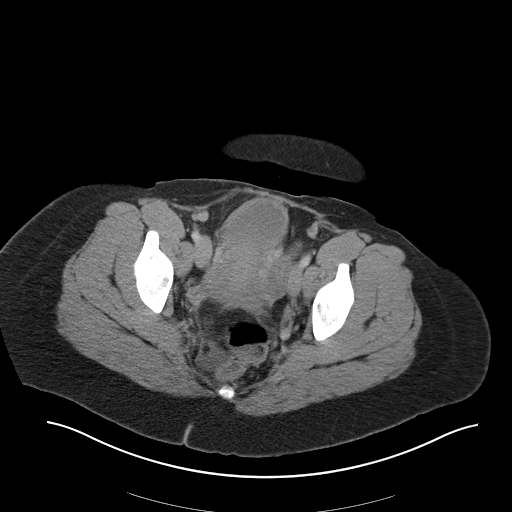
[im 34/92  soft-tissue]
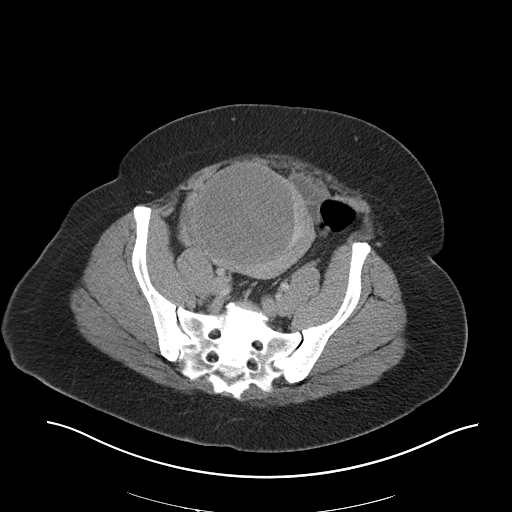
[im 39/92  soft-tissue]
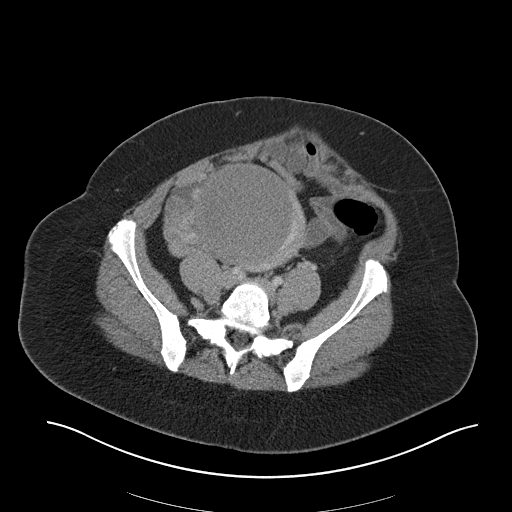
[im 48/92  soft-tissue]
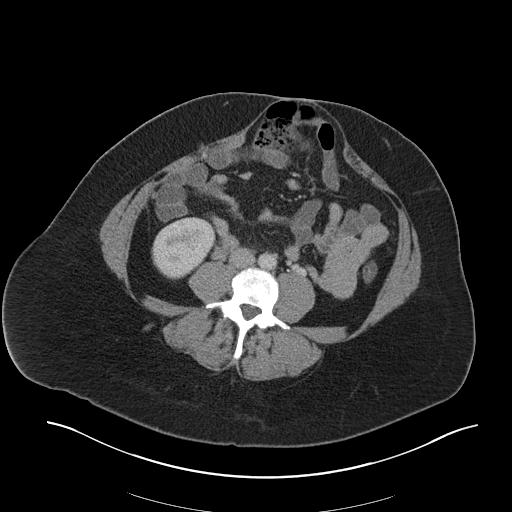
[im 53/92  soft-tissue]
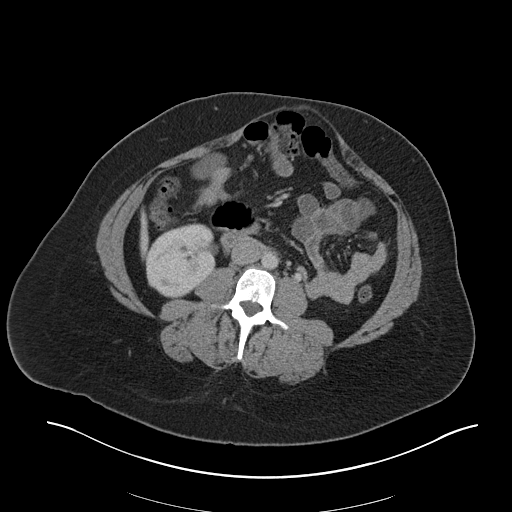
[im 58/92  soft-tissue]
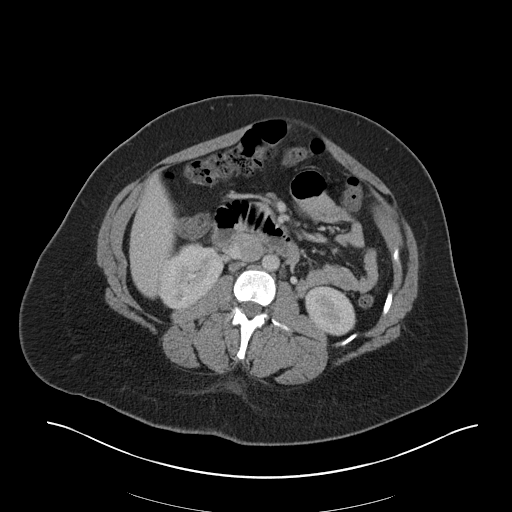
[im 58/92  bone]
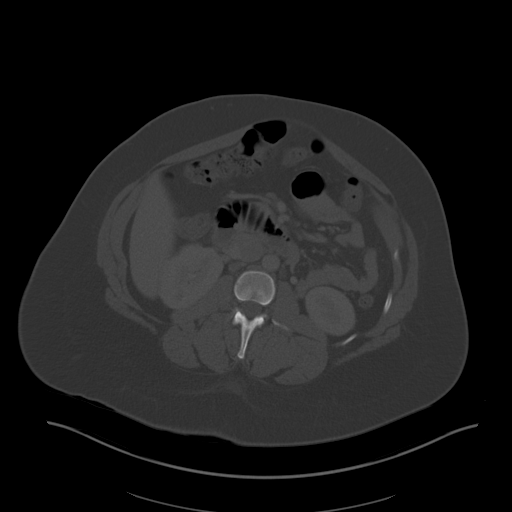
[im 68/92  soft-tissue]
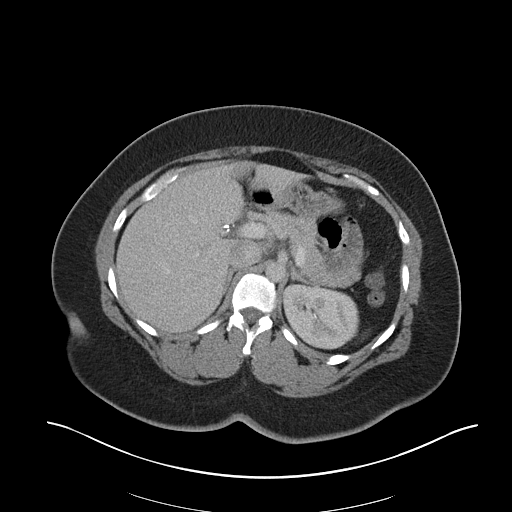
[im 72/92  soft-tissue]
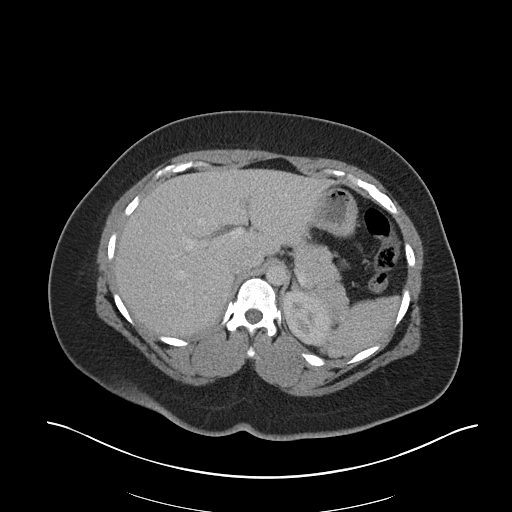
[im 77/92  soft-tissue]
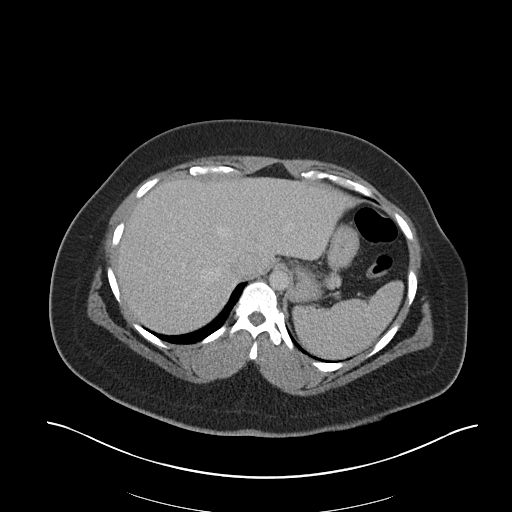
[im 87/92  soft-tissue]
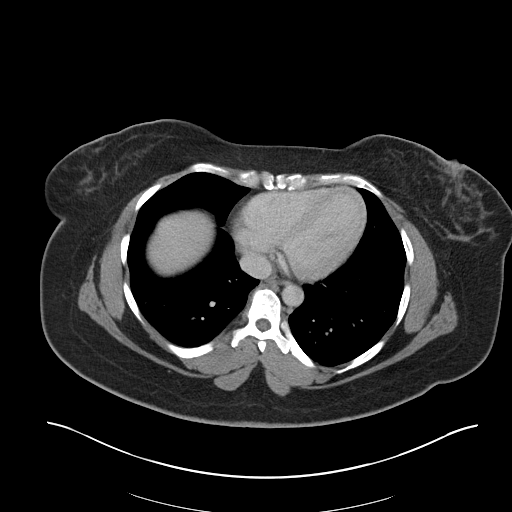

[Series 5: coronal st · coronal · 0.85mm/px · 3 of 168 slices shown]
[im 56/168  soft-tissue]
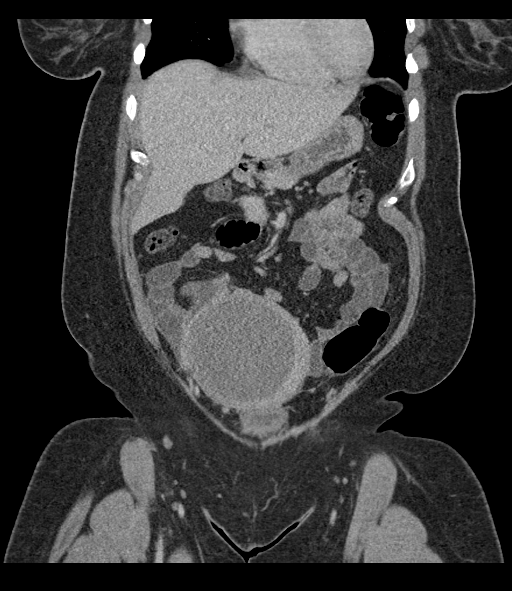
[im 75/168  soft-tissue]
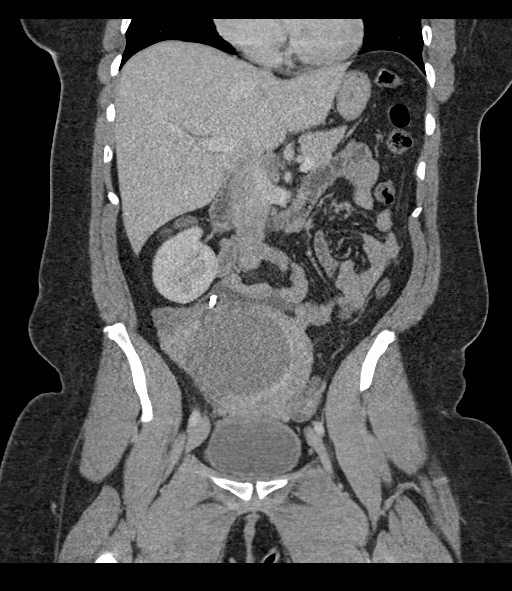
[im 93/168  soft-tissue]
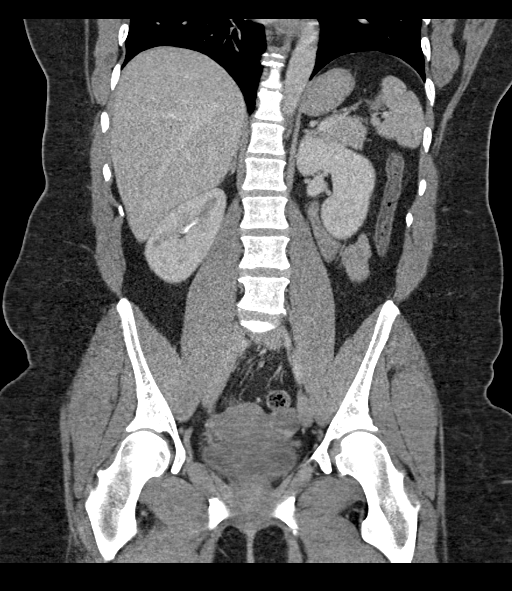

[16 of 46 positions shown; findings below may reference images not displayed]

FINDINGS: Lower chest: The visualized lung bases are clear.

No intra-abdominal free air.  Small free fluid in the pelvis.

Hepatobiliary: The liver is unremarkable. No intrahepatic biliary
dilatation. Cholecystectomy. No retained calcified stone noted in
the central CBD.

Pancreas: Unremarkable. No pancreatic ductal dilatation or
surrounding inflammatory changes.

Spleen: Normal in size without focal abnormality.

Adrenals/Urinary Tract: The adrenal glands unremarkable. There is no
hydronephrosis on either side. The visualized ureters and urinary
bladder appear unremarkable.

Stomach/Bowel: There is no bowel obstruction or active inflammation.
The appendix is normal.

Vascular/Lymphatic: The abdominal aorta and IVC unremarkable. No
portal venous gas. There is no adenopathy.

Reproductive: The uterus is anteverted. There is a 9.2 x 8.5 cm low
attenuating lesion in the right uterine body/fundus with mass effect
and displacement of the endometrium. This most likely represents a
degenerative fibroid with necrotic content. Further initial
evaluation with pelvic ultrasound recommended. MRI may provide
better evaluation depending on sonographic findings and due to large
size of the lesion. Bilateral tubal ligation clips noted.

Other: There is diastasis of anterior abdominal wall musculature in
the midline with a broad-base hernia.

Musculoskeletal: No acute or significant osseous findings.
IMPRESSION: Probable large degenerative fibroid in the right upper uterus.
Further evaluation with ultrasound or MRI is recommended.

## 2021-07-24 ENCOUNTER — Ambulatory Visit (INDEPENDENT_AMBULATORY_CARE_PROVIDER_SITE_OTHER): Payer: 59 | Admitting: Psychology

## 2021-07-24 DIAGNOSIS — F900 Attention-deficit hyperactivity disorder, predominantly inattentive type: Secondary | ICD-10-CM | POA: Diagnosis not present

## 2021-07-24 DIAGNOSIS — F322 Major depressive disorder, single episode, severe without psychotic features: Secondary | ICD-10-CM | POA: Diagnosis not present

## 2021-08-13 ENCOUNTER — Ambulatory Visit: Payer: 59 | Admitting: Psychology

## 2021-08-23 NOTE — Progress Notes (Signed)
Chart reviewed, agree above plan ?

## 2021-08-29 ENCOUNTER — Ambulatory Visit (INDEPENDENT_AMBULATORY_CARE_PROVIDER_SITE_OTHER): Payer: 59 | Admitting: Psychology

## 2021-08-29 DIAGNOSIS — F322 Major depressive disorder, single episode, severe without psychotic features: Secondary | ICD-10-CM | POA: Diagnosis not present

## 2021-08-29 DIAGNOSIS — F900 Attention-deficit hyperactivity disorder, predominantly inattentive type: Secondary | ICD-10-CM | POA: Insufficient documentation

## 2021-08-29 NOTE — Progress Notes (Signed)
[facility] Counselor/Therapist Progress Note  Patient ID: Kitrina Maurin, MRN: [id number]    Date: 08/29/21  Time Spent: 12:37 pm - 10:23 pm : 62 Minutes  Treatment Type: Individual Therapy.  Reported Symptoms: Low motivation, low energy, negative self-talk.   Mental Status Exam: Appearance:  Neat and Well Groomed     Behavior: Appropriate  Motor: Normal  Speech/Language:  Clear and Coherent and Normal Rate  Affect: Appropriate  Mood: normal  Thought process: normal  Thought content:   WNL  Sensory/Perceptual disturbances:   WNL  Orientation: oriented to person, place, time/date, and situation  Attention: Good  Concentration: Good  Memory: WNL  Fund of knowledge:  Good  Insight:   Good  Judgment:  Good  Impulse Control: Good   Risk Assessment: Danger to Self:  No Self-injurious Behavior: No Danger to Others: No Duty to Warn:no Physical Aggression / Violence:No  Access to Firearms a concern: No  Gang Involvement:No   Subjective:   [name] participated from home, via video, and consented to treatment. Therapist participated from home office. We met online due to [place] pandemic. Asher reviewed the events of the past week. She endorsed symptoms of low motivation, low energy, negative self-talk in relation to her decline in frequency of exercise. We explored this during the session to identify possible barriers and contributing factors. Haelee noted numerous contributing factors including her cycle, a busy holiday schedule, and unrealistic expectations. Therapist employed BA framework to identify and address barriers, set reasonable expectations, and schedule the tasks. We discussed the use of mindfulness and challenging negative self-talk via positive thoughts, boundaries, and use of evidence to challenge negative and automatic negative messages. Therapist praised Alvita for her effort and energy to challenge negative messages between sessions. Emiliana was engaged  and motivated during the session and expressed her commitment towards our goals. Therapist praised Leonna for her effort and energy and provided supportive therapy.   Interventions: Cognitive Behavioral Therapy and Behavioral Activation.   Diagnosis:   ADHD, predominantly inattentive type  Depression, major, single episode, severe ([place])   Plan: Patient is to use CBT, BA, Problem-solving, mindfulness, and coping skills to help manage decrease symptoms associated with their diagnosis.   Related Problem: Develop healthy thinking patterns and beliefs about self, others, and the world that lead to the alleviation and help prevent the relapse of depression. Description: Implement mindfulness techniques for relapse prevention. Target Date: 2021-10-05 Progress: 0%  Related Problem: Develop healthy thinking patterns and beliefs about self, others, and the world that lead to the alleviation and help prevent the relapse of depression. Description: Use mindfulness and acceptance strategies to reduce experiential and cognitive avoidance and increase value-based behavior. Target Date: 2021-10-05 Progress: 0%   Related Problem: Develop healthy thinking patterns and beliefs about self, others, and the world that lead to the alleviation and help prevent the relapse of depression. Description: Verbalize an understanding of the rationale for treatment of depression. Target Date: 2021-10-05 Progress: 0%  Related Problem: Develop healthy thinking patterns and beliefs about self, others, and the world that lead to the alleviation and help prevent the relapse of depression. Description: Cooperate with a medication evaluation by a physician. Target Date: 2021-10-05 Progress: 100%   Related Problem: Develop healthy thinking patterns and beliefs about self, others, and the world that lead to the alleviation and help prevent the relapse of depression. Description: Learn and implement behavioral strategies to  overcome depression. Target Date: 2021-10-05 Progress: 0%  Related Problem: Develop healthy thinking  patterns and beliefs about self, others, and the world that lead to the alleviation and help prevent the relapse of depression. Description: Learn and implement relapse prevention skills. Target Date: 2021-10-05 Progress: 10%  Related Problem: Develop healthy thinking patterns and beliefs about self, others, and the world that lead to the alleviation and help prevent the relapse of depression. Description: Learn and implement problem-solving and decision-making skills. Target Date: 2021-10-05 Progress: 10%  Related Problem: Learn and implement coping skills that result in a reduction of anxiety and worry, and improved daily functioning. Description: Identify, challenge, and replace biased, fearful self-talk with positive, realistic, and empowering self-talk. Target Date: 2021-10-05 Progress: 30%  Related Problem: Learn and implement coping skills that result in a reduction of anxiety and worry, and improved daily functioning. Description: Identify the major life conflicts from the past and present that form the basis for present anxiety. Target Date: 2021-10-05 Progress: 20%   Related Problem: Learn and implement coping skills that result in a reduction of anxiety and worry, and improved daily functioning. Description: Learn and implement a strategy to limit the association between various environmental settings and worry, delaying the worry until a designated "worry time." Target Date: 2021-10-05 Progress: 0%  Related Problem: Learn and implement coping skills that result in a reduction of anxiety and worry, and improved daily functioning. Description: Verbalize an understanding of the cognitive, physiological, and behavioral components of anxiety and its treatment. Target Date: 2021-10-05 Progress: 20%  Related Problem: Learn and implement coping skills that result in a reduction of  anxiety and worry, and improved daily functioning. Description: Cooperate with a medication evaluation by a physician. Target Date: 2021-10-05 Progress: 100%  Related Problem: Learn and implement coping skills that result in a reduction of anxiety and worry, and improved daily functioning. Description: Learn to accept limitations in life and commit to tolerating, rather than avoiding, unpleasant emotions while accomplishing meaningful goals. Target Date: 2021-10-05 Progress: 20%  Related Problem: Sustain attention and concentration for consistently longer periods of time. Description: Combine skills learned in therapy into a new daily approach to managing ADHD. Target Date: 2021-10-05 Progress: 0%  Related Problem: Sustain attention and concentration for consistently longer periods of time. Description: List the negative consequences of the ADD problematic behavior. Target Date: 2021-10-05 Progress: 0%  Related Problem: Sustain attention and concentration for consistently longer periods of time. Description: Read self-help books about ADHD to improve understanding of the condition and its features. Target Date: 2021-10-05 Progress: 0%  Related Problem: Sustain attention and concentration for consistently longer periods of time. Description: Learn and implement organization and planning skills. Target Date: 2021-10-05 Progress: 0%  Related Problem: Sustain attention and concentration for consistently longer periods of time. Description: Learn and implement skills to reduce the disruptive influence of distractibility. Target Date: 2021-10-05 Progress: 0%  Buena Irish, LCSW

## 2021-10-03 ENCOUNTER — Ambulatory Visit (INDEPENDENT_AMBULATORY_CARE_PROVIDER_SITE_OTHER): Payer: Self-pay | Admitting: Psychology

## 2021-10-03 DIAGNOSIS — F900 Attention-deficit hyperactivity disorder, predominantly inattentive type: Secondary | ICD-10-CM

## 2021-10-03 DIAGNOSIS — F322 Major depressive disorder, single episode, severe without psychotic features: Secondary | ICD-10-CM
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
## 2021-10-16 ENCOUNTER — Ambulatory Visit: Payer: Self-pay | Admitting: Psychology

## 2021-10-31 ENCOUNTER — Ambulatory Visit (INDEPENDENT_AMBULATORY_CARE_PROVIDER_SITE_OTHER): Payer: Self-pay | Admitting: Psychology

## 2021-10-31 DIAGNOSIS — F322 Major depressive disorder, single episode, severe without psychotic features: Secondary | ICD-10-CM

## 2021-10-31 DIAGNOSIS — F900 Attention-deficit hyperactivity disorder, predominantly inattentive type: Secondary | ICD-10-CM

## 2021-10-31 NOTE — Progress Notes (Signed)
[facility] Counselor/Therapist Progress Note  Patient ID: [name], MRN: [id number]   Date: 10/31/21  Time Spent: 10:02  am - 10:53 am : 51 Minutes  Treatment Type: Individual Therapy.  Reported Symptoms: Depression  Mental Status Exam: Appearance:  Neat and Well Groomed     Behavior: Appropriate  Motor: Normal  Speech/Language:  Negative  Affect: Flat  Mood: depressed  Thought process: normal  Thought content:   WNL  Sensory/Perceptual disturbances:   WNL  Orientation: oriented to person, place, time/date, and situation  Attention: Good  Concentration: Good  Memory: WNL  Fund of knowledge:  Good  Insight:   Good  Judgment:  Good  Impulse Control: Good   Risk Assessment: Danger to Self:  [place]e Self-injurious Behavior: No Danger to Others: No Duty to Warn:no Physical Aggression / Violence:No  Access to Firearms a concern: No  Gang Involvement:No   Subjective:   [name] participated from home, via video and consented to treatment. Therapist participated from home office. We met online due to [place] pandemic. [name] reviewed the events of the past week. [name] noted "spiraling" after a dental visit. [name] noted school "kicking my butt". We explored this during the session, her coping, negative self-talk, and ways that she has attempted to mange similar situations previously. We explored her academic stressors and her feelings regarding the cause of her difficulty. [name] noted the possible causes of this including the material, her mood, and difficulty sustaining attention. We reviewed numerous treatment approaches including CBT, BA, Problem Solving, and Solution focused therapy. Psych-education regarding the [name]'s diagnosis of ADHD, predominantly inattentive type  Depression, major, single episode, severe ([place]) was provided during the session. We discussed [name] goals treatment goals which include decrease rumination, think more positively,  maintain positive behaviors (exercise), and manage overall mood. [name] provided verbal approval of the treatment plan.   Interventions: Psycho-education & Goal Setting.   Diagnosis:  ADHD, predominantly inattentive type  Depression, major, single episode, severe ([place])   Treatment Plan:  Client Abilities/Strengths [name] is intelligent, forthcoming, and motivated for change.   Support System: Family, husband.   Client Treatment Preferences Outpatient Therapy.   Client Statement of Needs [name] would like to decrease rumination, think more positively, maintain positive behaviors (exercise), and manage overall mood.    Treatment Level Weekly  Symptoms  Depression: Low self-esteem, negative self-talk, negative automatic thoughts, infrequent self-care, feelings of worthlessness, sadness.    (Status: maintained) Anxiety: Consistent worry, difficulty managing worry, tension, irritation  (Status: maintained)  Goals:   [name] experiences symptoms of depression, anxiety, and ADHD.    Target Date: 10/31/22 Frequency: Weekly  Progress: 0 Modality: individual    Therapist will provide referrals for additional resources as appropriate.  Therapist will provide psycho-education regarding [name] diagnosis and corresponding treatment approaches and interventions. Licensed Clinical Social Worker, [place], LCSW will support the patient's ability to achieve the goals identified. will employ CBT, BA, Problem-solving, Solution Focused, Mindfulness,  coping skills, & other evidenced-based practices will be used to promote progress towards healthy functioning to help manage decrease symptoms associated with her diagnosis.   Reduce overall level, frequency, and intensity of the feelings of depression and anxiety evidenced by decreased overall symptoms including rumination, negative self-talk, negative automatic thoughts, depressive moods/symptoms from 6 to 7 days/week to 0 to 1  days/week per client report for at least 3 consecutive months. Verbally express understanding of the relationship between feelings of depression, anxiety and their impact on thinking patterns  and behaviors. Verbalize an understanding of the role that distorted thinking plays in creating fears, excessive worry, and ruminations.    [name] participated in the creation of the treatment plan)   Buena Irish, LCSW

## 2021-11-28 ENCOUNTER — Ambulatory Visit (INDEPENDENT_AMBULATORY_CARE_PROVIDER_SITE_OTHER): Payer: Self-pay | Admitting: Psychology

## 2021-11-28 DIAGNOSIS — F322 Major depressive disorder, single episode, severe without psychotic features: Secondary | ICD-10-CM

## 2021-11-28 DIAGNOSIS — F900 Attention-deficit hyperactivity disorder, predominantly inattentive type: Secondary | ICD-10-CM

## 2021-11-28 NOTE — Progress Notes (Signed)
[facility] Counselor/Therapist Progress Note ? ?Patient ID: [name], MRN: [id number]  ? ?Date: 11/28/21 ? ?Time Spent: 11:34 am - 12:25 am : 51 Minutes ? ?Treatment Type: Individual Therapy. ? ?Reported Symptoms: Depression ? ?Mental Status Exam: ?Appearance:  Neat and Well Groomed     ?Behavior: Appropriate  ?Motor: Normal  ?Speech/Language:  Negative  ?Affect: Flat  ?Mood: depressed  ?Thought process: normal  ?Thought content:   WNL  ?Sensory/Perceptual disturbances:   WNL  ?Orientation: oriented to person, place, time/date, and situation  ?Attention: Good  ?Concentration: Good  ?Memory: WNL  ?Fund of knowledge:  Good  ?Insight:   Good  ?Judgment:  Good  ?Impulse Control: Good  ? ?Risk Assessment: ?Danger to Self:  No ?Self-injurious Behavior: No ?Danger to Others: No ?Duty to Warn:no ?Physical Aggression / Violence:No  ?Access to Firearms a concern: No  ?Gang Involvement:No  ? ?In case of a mental health emergency: ? ?30 - confidential suicide hotline. ?[facility]):  ?      [address], [place] [address] ?      [phone] ?3.   911  ?4.   Visiting Nearest ED.  ? ? ?Subjective:  ? ?[name] participated from home, via video and consented to treatment. Therapist participated from home office. We met online due to [place] pandemic. [name] reviewed the events of the past week. [name] noted increased depression and endorsed SI during that time. She noted difficulty ascertaining why she was feeling this way. We explored this during the session and possible contributing factors to her mood. She is currently restarting her Concerta 48 with her psychiatric provider - Chales Abrahams - NP.  Worked on identifying contributing factors to her overall mood compassion discussed an increase in caffeine, reduction in sleep, financial stressors, her daughter's recent illness, a decrease in dietary intake possibly affecting her overall mood.  She has been out  of her ADHD medication for some time and noted that this affected her overall ability to complete tasks and maintain concentration.  Therapist provided psychoeducation regarding the importance of creating a rhythm that includes consistent self-care regardless of current mood.  Therapist encouraged [name] to take her medication regularly, to follow up with her nurse practitioner regarding a possible replacement to her Prozac, and to engage in mindfulness on a consistent basis.  We worked on identifying positives that are currently occurring and therapist encouraged [name] to challenge negative thoughts going forward.  We reviewed safety and [name] expressed understanding and agreement.  See safety plan posted above.  We scheduled numerous follow-ups and therapist encouraged [name] to contact provider, earlier than scheduled, should there be a need.  [name] was engaged and motivated during the session and expressed commitment towards her goals.  We problem solved ways to be mindful of taking medication regularly, specifically the Concerta, and [name] was receptive.  Therapist provided supportive therapy and validation. [name] denied any SI during the session. ? ?Interventions: CBT & problem-solving.  ? ?Diagnosis:  ?ADHD, predominantly inattentive type ? ?Depression, major, single episode, severe ([place]) ? ? ?Treatment Plan: ? ?Client Abilities/Strengths ?[name] is intelligent, forthcoming, and motivated for change.  ? ?Support System: ?Family, husband.  ? ?Client Treatment Preferences ?Outpatient Therapy.  ? ?Client Statement of Needs ?[name] would like to decrease rumination, think more positively, maintain positive behaviors (exercise), and manage overall mood.   ? ?Treatment Level ?Weekly ? ?Symptoms ? ?Depression: Low self-esteem, negative self-talk, negative automatic thoughts, infrequent self-care, feelings  of worthlessness, sadness.    (Status: declined) ?Anxiety: Consistent worry, difficulty managing worry,  tension, irritation  (Status: declined) ? ?Goals:  ? ?[name] experiences symptoms of depression, anxiety, and ADHD.  ? ? ?Target Date: 10/31/22 Frequency: Weekly  ?Progress: 0 Modality: individual  ? ? ?Therapist will provide referrals for additional resources as appropriate.  ?Therapist will provide psycho-education regarding [name]'s diagnosis and corresponding treatment approaches and interventions. ?Licensed Clinical Social Worker, Buena Irish, LCSW will support the patient's ability to achieve the goals identified. will employ CBT, BA, Problem-solving, Solution Focused, Mindfulness,  coping skills, & other evidenced-based practices will be used to promote progress towards healthy functioning to help manage decrease symptoms associated with her diagnosis.  ? Reduce overall level, frequency, and intensity of the feelings of depression and anxiety evidenced by decreased overall symptoms including rumination, negative self-talk, negative automatic thoughts, depressive moods/symptoms from 6 to 7 days/week to 0 to 1 days/week per client report for at least 3 consecutive months. ?Verbally express understanding of the relationship between feelings of depression, anxiety and their impact on thinking patterns and behaviors. ?Verbalize an understanding of the role that distorted thinking plays in creating fears, excessive worry, and ruminations. ? ? ? ?([name] participated in the creation of the treatment plan) ? ? ?Buena Irish, LCSW ? ?  ?

## 2021-12-11 ENCOUNTER — Ambulatory Visit (INDEPENDENT_AMBULATORY_CARE_PROVIDER_SITE_OTHER): Payer: Self-pay | Admitting: Psychology

## 2021-12-11 DIAGNOSIS — F900 Attention-deficit hyperactivity disorder, predominantly inattentive type: Secondary | ICD-10-CM

## 2021-12-11 DIAGNOSIS — F322 Major depressive disorder, single episode, severe without psychotic features: Secondary | ICD-10-CM

## 2021-12-11 NOTE — Progress Notes (Signed)
[facility] Counselor/Therapist Progress Note ? ?Patient ID: [name], MRN: [id number]  ? ?Date: 12/11/21 ? ?Time Spent: 10:02 am - 10:51 am : 49 Minutes ? ?Treatment Type: Individual Therapy. ? ?Reported Symptoms: Depression ? ?Mental Status Exam: ?Appearance:  Neat and Well Groomed     ?Behavior: Appropriate  ?Motor: Normal  ?Speech/Language:  Negative  ?Affect: Flat  ?Mood: depressed  ?Thought process: normal  ?Thought content:   WNL  ?Sensory/Perceptual disturbances:   WNL  ?Orientation: oriented to person, place, time/date, and situation  ?Attention: Good  ?Concentration: Good  ?Memory: WNL  ?Fund of knowledge:  Good  ?Insight:   Good  ?Judgment:  Good  ?Impulse Control: Good  ? ?Risk Assessment: ?Danger to Self:  No ?Self-injurious Behavior: No ?Danger to Others: No ?Duty to Warn:no ?Physical Aggression / Violence:No  ?Access to Firearms a concern: No  ?Gang Involvement:No  ? ?In case of a mental health emergency: ? ?36 - confidential suicide hotline. ?[name]):  ?      [address], [place] [address] ?      [phone] ?3.   911  ?4.   Visiting Nearest ED.  ? ? ?Subjective:  ? ?[name] participated from home, via video and consented to treatment. Therapist participated from home office. We met online due to [place] pandemic. [name] reviewed the events of the past week. [name] noted restarting her Concerta 54 mg qd and noted a significant improvement, as a result.  We explored her medication regimen-discussed her plan to take her medication daily although she previously considered taking it weekly with the exception of the weekends.  Therapist encouraged [name] to follow up with her prescriber.  She discussed feelings of guilt and shame regarding her ADHD diagnosis and being diagnosed in adulthood. We worked on processing this and the lack of familial support. We worked on processing this and worked on identifying the positives of  diagnosis. Therapist recommended a book "Driven to Distraction" which focuses on ADHD psycho-education and management.  Therapist encouraged [name] to work on reflecting on her past routine that was effective in managing symptoms so we can delineate that during our follow-up and create structure to her day to day.  Therapist encouraged self-care including sleep, exercise, healthful eating and discussed the effects of this on mood management and ADHD symptom management.  Therapist validated and normalized actions experience and feelings and provided in-depth psychoeducation regarding ADHD and ADHD diagnostic process and adulthood.  [name] was engaged and motivated during the session she denied any safety concerns.  We have a safety plan created in previous sessions.  Therapist validated and normalized [name] experience and provided supportive therapy. ? ?Interventions: Psycho-education ? ?Diagnosis:  ?ADHD, predominantly inattentive type ? ?Depression, major, single episode, severe ([place]) ? ? ?Treatment Plan: ? ?Client Abilities/Strengths ?[name] is intelligent, forthcoming, and motivated for change.  ? ?Support System: ?Family, husband.  ? ?Client Treatment Preferences ?Outpatient Therapy.  ? ?Client Statement of Needs ?[name] would like to decrease rumination, think more positively, maintain positive behaviors (exercise), and manage overall mood.   ? ?Treatment Level ?Weekly ? ?Symptoms ? ?Depression: Low self-esteem, negative self-talk, negative automatic thoughts, infrequent self-care, feelings of worthlessness, sadness.    (Status: improved) ?Anxiety: Consistent worry, difficulty managing worry, tension, irritation  (Status: improved) ? ?Goals:  ? ?[name] experiences symptoms of depression, anxiety, and ADHD.  ? ? ?Target Date: 10/31/22 Frequency: Weekly  ?Progress: 0 Modality: individual  ? ? ?Therapist will  provide referrals for additional resources as appropriate.  ?Therapist will provide psycho-education  regarding [name]'s diagnosis and corresponding treatment approaches and interventions. ?Licensed Clinical Social Worker, Mary Irish, LCSW will support the patient's ability to achieve the goals identified. will employ CBT, BA, Problem-solving, Solution Focused, Mindfulness,  coping skills, & other evidenced-based practices will be used to promote progress towards healthy functioning to help manage decrease symptoms associated with her diagnosis.  ? Reduce overall level, frequency, and intensity of the feelings of depression and anxiety evidenced by decreased overall symptoms including rumination, negative self-talk, negative automatic thoughts, depressive moods/symptoms from 6 to 7 days/week to 0 to 1 days/week per client report for at least 3 consecutive months. ?Verbally express understanding of the relationship between feelings of depression, anxiety and their impact on thinking patterns and behaviors. ?Verbalize an understanding of the role that distorted thinking plays in creating fears, excessive worry, and ruminations. ? ? ? ?([name] participated in the creation of the treatment plan) ? ? ?Mary Irish, LCSW ?

## 2021-12-18 ENCOUNTER — Ambulatory Visit (INDEPENDENT_AMBULATORY_CARE_PROVIDER_SITE_OTHER): Payer: Self-pay | Admitting: Psychology

## 2021-12-18 DIAGNOSIS — F900 Attention-deficit hyperactivity disorder, predominantly inattentive type: Secondary | ICD-10-CM

## 2021-12-18 DIAGNOSIS — F322 Major depressive disorder, single episode, severe without psychotic features: Secondary | ICD-10-CM

## 2021-12-18 NOTE — Progress Notes (Signed)
[facility] [place] Counselor/Therapist Progress Note ? ?Patient ID: [name], MRN: [id number]  ? ?Date: 12/19/21 ? ?Time Spent: 10:03 am - 10:49 am : 46 Minutes ? ?Treatment Type: Individual Therapy. ? ?Reported Symptoms: Depression ? ?Mental Status Exam: ?Appearance:  Neat and Well Groomed     ?Behavior: Appropriate  ?Motor: Normal  ?Speech/Language:  Negative  ?Affect: Flat  ?Mood: depressed  ?Thought process: normal  ?Thought content:   WNL  ?Sensory/Perceptual disturbances:   WNL  ?Orientation: oriented to person, place, time/date, and situation  ?Attention: Good  ?Concentration: Good  ?Memory: WNL  ?Fund of knowledge:  Good  ?Insight:   Good  ?Judgment:  Good  ?Impulse Control: Good  ? ?Risk Assessment: ?Danger to Self:  No ?Self-injurious Behavior: No ?Danger to Others: No ?Duty to Warn:no ?Physical Aggression / Violence:No  ?Access to Firearms a concern: No  ?Gang Involvement:No  ? ?In case of a mental health emergency: ? ?77 - confidential suicide hotline. ?[facility]):  ?      [address], [place] [address] ?      [phone] ?3.   911  ?4.   Visiting Nearest ED.  ? ? ?Subjective:  ? ?[name] participated from home, via video and consented to treatment. Therapist participated from home office. We met online due to [place] pandemic. Mechille reviewed the events of the past week. Charae noted some relief in her test taking anxiety as a result of doing well during a recent exam. She noted some worry about future test anxiety and noted this being an area of possible concentration. We explored this during the sessio. Ayva noted second guessing efforts, not having confidence in answering questions correctly, and "did I do enough?", She noted feeling overwhelmed in the moment and having "no way to pull myself out of that moment". She noted feelings of panic due to time limits of exam. Azuri noted negative self-talk including "you're not doing  enough". She noted pressure in childhood to "perform" and noted "I was always pushed to be more and be better" in regards to her mother. We explored this during the session. Therapist encouraged Gaile to work on symptom management using previously learned tools, set reasonable expectations, and communicate needs assertively including boundaries for her children to get uninterrupted time to concentrate on tasks. Shomari was engaged and motivated during the session. Therapist validated and normalized [name] experience and provided supportive therapy. ? ?Interventions: Psycho-education ? ?Diagnosis:  ?Depression, major, single episode, severe ([place]) ? ?ADHD, predominantly inattentive type ? ? ?Treatment Plan: ? ?Client Abilities/Strengths ?Edilia is intelligent, forthcoming, and motivated for change.  ? ?Support System: ?Family, husband.  ? ?Client Treatment Preferences ?Outpatient Therapy.  ? ?Client Statement of Needs ?Maki would like to decrease rumination, think more positively, maintain positive behaviors (exercise), and manage overall mood.   ? ?Treatment Level ?Weekly ? ?Symptoms ? ?Depression: Low self-esteem, negative self-talk, negative automatic thoughts, infrequent self-care, feelings of worthlessness, sadness.    (Status: improved) ?Anxiety: Consistent worry, difficulty managing worry, tension, irritation  (Status: improved) ? ?Goals:  ? ?Kynsie experiences symptoms of depression, anxiety, and ADHD.  ? ? ?Target Date: 10/31/22 Frequency: Weekly  ?Progress: 0 Modality: individual  ? ? ?Therapist will provide referrals for additional resources as appropriate.  ?Therapist will provide psycho-education regarding [place]'s diagnosis and corresponding treatment approaches and interventions. ?Licensed Clinical Social Worker, Buena Irish, LCSW will support the patient's ability to achieve the goals identified. will employ CBT, BA,  Problem-solving, Solution Focused, Mindfulness,  coping skills, & other  evidenced-based practices will be used to promote progress towards healthy functioning to help manage decrease symptoms associated with her diagnosis.  ? Reduce overall level, frequency, and intensity of the feelings of depression and anxiety evidenced by decreased overall symptoms including rumination, negative self-talk, negative automatic thoughts, depressive moods/symptoms from 6 to 7 days/week to 0 to 1 days/week per client report for at least 3 consecutive months. ?Verbally express understanding of the relationship between feelings of depression, anxiety and their impact on thinking patterns and behaviors. ?Verbalize an understanding of the role that distorted thinking plays in creating fears, excessive worry, and ruminations. ? ? ? ?([name] participated in the creation of the treatment plan) ? ? ?Buena Irish, LCSW ?

## 2022-01-06 DIAGNOSIS — H5213 Myopia, bilateral: Secondary | ICD-10-CM | POA: Diagnosis not present

## 2022-01-09 ENCOUNTER — Ambulatory Visit (INDEPENDENT_AMBULATORY_CARE_PROVIDER_SITE_OTHER): Payer: Self-pay | Admitting: Psychology

## 2022-01-09 DIAGNOSIS — F900 Attention-deficit hyperactivity disorder, predominantly inattentive type: Secondary | ICD-10-CM

## 2022-01-09 DIAGNOSIS — F322 Major depressive disorder, single episode, severe without psychotic features: Secondary | ICD-10-CM

## 2022-01-09 NOTE — Progress Notes (Signed)
[facility] Counselor/Therapist Progress Note ? ?Patient ID: [name], MRN: [id number]  ? ?Date: 01/09/22 ? ?Time Spent: 10:33 am - 11:29 am : 56 Minutes ? ?Treatment Type: Individual Therapy. ? ?Reported Symptoms: Depression ? ?Mental Status Exam: ?Appearance:  Neat and Well Groomed     ?Behavior: Appropriate  ?Motor: Normal  ?Speech/Language:  Negative  ?Affect: Depressed  ?Mood: depressed  ?Thought process: normal  ?Thought content:   WNL  ?Sensory/Perceptual disturbances:   WNL  ?Orientation: oriented to person, place, time/date, and situation  ?Attention: Good  ?Concentration: Good  ?Memory: WNL  ?Fund of knowledge:  Good  ?Insight:   Good  ?Judgment:  Good  ?Impulse Control: Good  ? ?Risk Assessment: ?Danger to Self:  No ?Self-injurious Behavior: No ?Danger to Others: No ?Duty to Warn:no ?Physical Aggression / Violence:No  ?Access to Firearms a concern: No  ?Gang Involvement:No  ? ?In case of a mental health emergency: ? ?35 - confidential suicide hotline. ?[facility]):  ?      [address], [place] [address] ?      [phone] ?3.   911  ?4.   Visiting Nearest ED.  ? ? ?Subjective:  ? ?[name] participated from home, via video and consented to treatment. Therapist participated from home office. We met online due to [place] pandemic. [name]. She noted a recent disagreement, with her husband, which resulted in her "balling" her eyes out for hours. She noted her husband apologizing to his reaction to the disagreement. We explored this during the session. She noted her daughter, [name], difficulty with maintaining concentration, complete tasks, and follow through on activities across multiple settings. Therapist provided psycho-education regarding ADHD for children and adults.  We worked on identifying ways to communicate needs and concerns to her husband regarding their daughter's attention.  Therapist  modeled this during the session and reviewed assertive and positive communication.  [name] was engaged and motivated during the session and expressed commitment towards these goals.  Therapist praised [name] for her effort and energy.  Therapist validated [name]'s experience and provided supportive therapy. ? ?Interventions: Psycho-education & interpersonal ? ?Diagnosis:  ?No diagnosis found. ? ? ?Treatment Plan: ? ?Client Abilities/Strengths ?[name] is intelligent, forthcoming, and motivated for change.  ? ?Support System: ?Family, husband.  ? ?Client Treatment Preferences ?Outpatient Therapy.  ? ?Client Statement of Needs ?[name] would like to decrease rumination, think more positively, maintain positive behaviors (exercise), and manage overall mood.   ? ?Treatment Level ?Weekly ? ?Symptoms ? ?Depression: Low self-esteem, negative self-talk, negative automatic thoughts, infrequent self-care, feelings of worthlessness, sadness.    (Status: improved) ?Anxiety: Consistent worry, difficulty managing worry, tension, irritation  (Status: improved) ? ?Goals:  ? ?[name] experiences symptoms of depression, anxiety, and ADHD.  ? ? ?Target Date: 10/31/22 Frequency: Weekly  ?Progress: 0 Modality: individual  ? ? ?Therapist will provide referrals for additional resources as appropriate.  ?Therapist will provide psycho-education regarding [name]'s diagnosis and corresponding treatment approaches and interventions. ?Licensed Clinical Social Worker, Buena Irish, LCSW will support the patient's ability to achieve the goals identified. will employ CBT, BA, Problem-solving, Solution Focused, Mindfulness,  coping skills, & other evidenced-based practices will be used to promote progress towards healthy functioning to help manage decrease symptoms associated with her diagnosis.  ? Reduce overall level, frequency, and intensity of the feelings of depression and anxiety evidenced by decreased overall symptoms including rumination,  negative self-talk, negative automatic  thoughts, depressive moods/symptoms from 6 to 7 days/White to 0 to 1 days/White per client report for at least 3 consecutive months. ?Verbally express understanding of the relationship between feelings of depression, anxiety and their impact on thinking patterns and behaviors. ?Verbalize an understanding of the role that distorted thinking plays in creating fears, excessive worry, and ruminations. ? ? ? ?([name] participated in the creation of the treatment plan) ? ? ?Buena Irish, LCSW ? ?

## 2022-01-23 ENCOUNTER — Ambulatory Visit (INDEPENDENT_AMBULATORY_CARE_PROVIDER_SITE_OTHER): Payer: Self-pay | Admitting: Psychology

## 2022-01-23 DIAGNOSIS — F900 Attention-deficit hyperactivity disorder, predominantly inattentive type: Secondary | ICD-10-CM

## 2022-01-23 DIAGNOSIS — F322 Major depressive disorder, single episode, severe without psychotic features: Secondary | ICD-10-CM

## 2022-01-23 NOTE — Progress Notes (Signed)
[facility] Counselor/Therapist Progress Note ? ?Patient ID: [name], MRN: [id number]  ? ?Date: 01/23/22 ? ?Time Spent: 10:04 am - 10:55 am : 51 Minutes ? ?Treatment Type: Individual Therapy. ? ?Reported Symptoms: Depression and anxiety. ? ?Mental Status Exam: ?Appearance:  Neat and Well Groomed     ?Behavior: Appropriate  ?Motor: Normal  ?Speech/Language:  Negative  ?Affect: Congruent  ?Mood: anxious  ?Thought process: normal  ?Thought content:   WNL  ?Sensory/Perceptual disturbances:   WNL  ?Orientation: oriented to person, place, time/date, and situation  ?Attention: Good  ?Concentration: Good  ?Memory: WNL  ?Fund of knowledge:  Good  ?Insight:   Good  ?Judgment:  Good  ?Impulse Control: Good  ? ?Risk Assessment: ?Danger to Self:  No ?Self-injurious Behavior: No ?Danger to Others: No ?Duty to Warn:no ?Physical Aggression / Violence:No  ?Access to Firearms a concern: No  ?Gang Involvement:No  ? ?In case of a mental health emergency: ? ?58 - confidential suicide hotline. ?[facility]):  ?      [address], [place] [address] ?      [phone] ?3.   911  ?4.   Visiting Nearest ED.  ? ? ?Subjective:  ? ?[name] participated from home, via video and consented to treatment. Therapist participated from home office. We met online due to [place] pandemic. [name] reviewed the events of the past week. She noted recent frustration regarding her daughter's inability to complete homework, at home, in a timely fashion but doing work speedily when [name] was watching over. She noted difficulty accessing her Concerta medication due to current shortage. We explored ways to address this via problem-solving. We identified possible solutions including contacting pharmacy for medication alternatives, contacting psychiatrist for guidance,  exploring alternative pharmacies, and contacting her insurance company. Therapist encouraged [name] to follow-up regarding  medication and to make this a priority going forward. Resources regarding local psychiatrists were provided. [name] was engaged and motivated during the session and expressed commitment towards these goals.  Therapist praised [name] for her effort and energy.  Therapist validated [name]'s experience and provided supportive therapy. ? ?Interventions: Psycho-education ? ?Diagnosis:  ?Depression, major, single episode, severe ([place]) ? ?ADHD, predominantly inattentive type ? ? ?Treatment Plan: ? ?Client Abilities/Strengths ?Banesa is intelligent, forthcoming, and motivated for change.  ? ?Support System: ?Family, husband.  ? ?Client Treatment Preferences ?Outpatient Therapy.  ? ?Client Statement of Needs ?[name] would like to decrease rumination, think more positively, maintain positive behaviors (exercise), and manage overall mood.   ? ?Treatment Level ?Weekly ? ?Symptoms ? ?Depression: Low self-esteem, negative self-talk, negative automatic thoughts, infrequent self-care, feelings of worthlessness, sadness.    (Status: improved) ?Anxiety: Consistent worry, difficulty managing worry, tension, irritation  (Status: improved) ? ?Goals:  ? ?[name] experiences symptoms of depression, anxiety, and ADHD.  ? ? ?Target Date: 10/31/22 Frequency: Weekly  ?Progress: 0 Modality: individual  ? ? ?Therapist will provide referrals for additional resources as appropriate.  ?Therapist will provide psycho-education regarding [name]'s diagnosis and corresponding treatment approaches and interventions. ?Licensed Clinical Social Worker, Buena Irish, LCSW will support the patient's ability to achieve the goals identified. will employ CBT, BA, Problem-solving, Solution Focused, Mindfulness,  coping skills, & other evidenced-based practices will be used to promote progress towards healthy functioning to help manage decrease symptoms associated with her diagnosis.  ? Reduce overall level, frequency, and intensity of the feelings of depression  and anxiety evidenced by decreased overall symptoms including rumination, negative self-talk,  negative automatic thoughts, depressive moods/symptoms from 6 to 7 days/week to 0 to 1 days/week per client report for at least 3 consecutive months. ?Verbally express understanding of the relationship between feelings of depression, anxiety and their impact on thinking patterns and behaviors. ?Verbalize an understanding of the role that distorted thinking plays in creating fears, excessive worry, and ruminations. ? ? ? ?([name] participated in the creation of the treatment plan) ? ? ?Buena Irish, LCSW ? ?

## 2022-02-05 ENCOUNTER — Ambulatory Visit (INDEPENDENT_AMBULATORY_CARE_PROVIDER_SITE_OTHER): Payer: Self-pay | Admitting: Psychology

## 2022-02-05 DIAGNOSIS — F322 Major depressive disorder, single episode, severe without psychotic features: Secondary | ICD-10-CM

## 2022-02-05 DIAGNOSIS — F900 Attention-deficit hyperactivity disorder, predominantly inattentive type: Secondary | ICD-10-CM

## 2022-02-05 NOTE — Progress Notes (Signed)
[facility] Counselor/Therapist Progress Note  Patient ID: [name], MRN: [id number]   Date: 02/05/22  Time Spent: 11:02 am - 11:56  am : 76 Minutes  Treatment Type: Individual Therapy.  Reported Symptoms: Depression and anxiety.  Mental Status Exam: Appearance:  Neat and Well Groomed     Behavior: Appropriate  Motor: Normal  Speech/Language:  Negative  Affect: Tearful  Mood: anxious and sad  Thought process: normal  Thought content:   WNL  Sensory/Perceptual disturbances:   WNL  Orientation: oriented to person, place, time/date, and situation  Attention: Good  Concentration: Good  Memory: WNL  Fund of knowledge:  Good  Insight:   Good  Judgment:  Good  Impulse Control: Good   Risk Assessment: Danger to Self:  No Self-injurious Behavior: No Danger to Others: No Duty to Warn:no Physical Aggression / Violence:No  Access to Firearms a concern: No  Gang Involvement:No   In case of a mental health emergency:  67 - confidential suicide hotline. [facility]):        [place], [place] [address]       [phone] 3.   911  4.   Visiting Nearest ED.    Subjective:   [name] participated from home, via video and consented to treatment. Therapist participated from home office. We met online due to [place] pandemic. Anyla reviewed the events of the past week. Jahira noted continued to struggle with refilling her stimulant medication. We worked on problem-solving to address this concerns. Additional contact information for psychiatric providers were provided via e-mail. She noted the effect on her ability to complete tasks at home and academically, without her medication. She noted often feeling negatively when unable to complete tasks and stay focused. We explored her self-talk, during this time, which she highlighted as negative. Chidinma was tearful during the session. We reviewed ways to manage self-talk  and discussed Janessa's previous progress in this area. She noted her anxiety regarding an upcoming meet-up and discussed worry that she might not know how to engage socially. She noted wanting people to like her. Therapist validated Savannha's feelings and provided supportive therapy. A follow-up was scheduled. Pamelia continues to benefit from therapy.   Interventions: Psycho-education  Diagnosis:  Depression, major, single episode, severe (HCC)  ADHD, predominantly inattentive type   Treatment Plan:  Client Abilities/Strengths Dazhane is intelligent, forthcoming, and motivated for change.   Support System: Family, husband.   Client Treatment Preferences Outpatient Therapy.   Client Statement of Needs Jashiya would like to decrease rumination, think more positively, maintain positive behaviors (exercise), and manage overall mood.    Treatment Level Weekly  Symptoms  Depression: Low self-esteem, negative self-talk, negative automatic thoughts, infrequent self-care, feelings of worthlessness, sadness.    (Status: improved) Anxiety: Consistent worry, difficulty managing worry, tension, irritation  (Status: improved)  Goals:   Jessicalynn experiences symptoms of depression, anxiety, and ADHD.    Target Date: 10/31/22 Frequency: Weekly  Progress: 0 Modality: individual    Therapist will provide referrals for additional resources as appropriate.  Therapist will provide psycho-education regarding Katniss's diagnosis and corresponding treatment approaches and interventions. Licensed Clinical Social Worker, [place], LCSW will support the patient's ability to achieve the goals identified. will employ CBT, BA, Problem-solving, Solution Focused, Mindfulness,  coping skills, & other evidenced-based practices will be used to promote progress towards healthy functioning to help manage decrease symptoms associated with her diagnosis.   Reduce overall level, frequency, and  intensity of the  feelings of depression and anxiety evidenced by decreased overall symptoms including rumination, negative self-talk, negative automatic thoughts, depressive moods/symptoms from 6 to 7 days/week to 0 to 1 days/week per client report for at least 3 consecutive months. Verbally express understanding of the relationship between feelings of depression, anxiety and their impact on thinking patterns and behaviors. Verbalize an understanding of the role that distorted thinking plays in creating fears, excessive worry, and ruminations.    [name] participated in the creation of the treatment plan)   Buena Irish, LCSW

## 2022-02-12 ENCOUNTER — Ambulatory Visit (INDEPENDENT_AMBULATORY_CARE_PROVIDER_SITE_OTHER): Payer: BC Managed Care – PPO | Admitting: Nurse Practitioner

## 2022-02-12 ENCOUNTER — Other Ambulatory Visit: Payer: Self-pay | Admitting: Nurse Practitioner

## 2022-02-12 ENCOUNTER — Encounter: Payer: Self-pay | Admitting: Nurse Practitioner

## 2022-02-12 VITALS — BP 126/76 | HR 75 | Temp 97.0°F | Ht 66.0 in | Wt 240.8 lb

## 2022-02-12 DIAGNOSIS — Z8632 Personal history of gestational diabetes: Secondary | ICD-10-CM | POA: Diagnosis not present

## 2022-02-12 DIAGNOSIS — R739 Hyperglycemia, unspecified: Secondary | ICD-10-CM | POA: Diagnosis not present

## 2022-02-12 DIAGNOSIS — F331 Major depressive disorder, recurrent, moderate: Secondary | ICD-10-CM

## 2022-02-12 DIAGNOSIS — F900 Attention-deficit hyperactivity disorder, predominantly inattentive type: Secondary | ICD-10-CM

## 2022-02-12 DIAGNOSIS — Z6838 Body mass index (BMI) 38.0-38.9, adult: Secondary | ICD-10-CM

## 2022-02-12 LAB — POCT GLYCOSYLATED HEMOGLOBIN (HGB A1C): Hemoglobin A1C: 5.7 % — AB (ref 4.0–5.6)

## 2022-02-12 NOTE — Assessment & Plan Note (Signed)
Ongoing CBT sessions every other week with some improvement. Also under the care of psychiatrist: Chales Abrahams, NP in [place], [place] She is requesting referral to another psychiatrist. No medication used at this time. Has thoughts about death and self worth, but no intention or plan to hurts herself or anyone else.  Entered psychiatry referral

## 2022-02-12 NOTE — Patient Instructions (Addendum)
You will be contacted to schedule appt with psychiatry. Maintain appt with current psychiatrist till you are able to to schedule appt with new psychiatrist.  hgbA1c at 5.7%: normal Continue heart healthy diet and daily exercise.

## 2022-02-12 NOTE — Assessment & Plan Note (Signed)
concerta prescribed by psychiatry Needs referral to new psychiatrist Per PMP last filled: 12/30/21, 11/28/21, 05/01/21  Entered psychiatry referral Advised to maintain appt with current psychiatrist till able to get appt with new provider. Advised [name] that Concerta refill will be provided by psychiatry only due to underlying uncontrolled depression and anxiety.

## 2022-02-12 NOTE — Progress Notes (Signed)
Established Patient Visit  Patient: [name]   DOB: [birth year]   36 y.o. Female  MRN: [id number] Visit Date: 02/12/2022  Subjective:    Chief Complaint  Patient presents with   Acute Visit    Needs referral to psychiatrist  Refill on Concerta  No other concerns   HPI Depression, recurrent ([place]) Ongoing CBT sessions every other week with some improvement. Also under the care of psychiatrist: Chales Abrahams, NP in [place], [place] She is requesting referral to another psychiatrist. No medication used at this time. Has thoughts about death and self worth, but no intention or plan to hurts herself or anyone else.  Entered psychiatry referral  ADHD, predominantly inattentive type concerta prescribed by psychiatry Needs referral to new psychiatrist Per PMP last filled: 12/30/21, 11/28/21, 05/01/21  Entered psychiatry referral Advised to maintain appt with current psychiatrist till able to get appt with new provider. Advised Ms. Halbleib that Concerta refill will be provided by psychiatry only due to underlying uncontrolled depression and anxiety.  Reviewed medical, surgical, and social history today  Medications: Outpatient Medications Prior to Visit  Medication Sig   methylphenidate 54 MG PO CR tablet Take 1 tablet (54 mg total) by mouth every morning.   ondansetron (ZOFRAN ODT) 4 MG disintegrating tablet Take 1 tablet (4 mg total) by mouth every 8 (eight) hours as needed for nausea or vomiting.   [DISCONTINUED] etonogestrel (NEXPLANON) 68 MG IMPL implant 1 each (68 mg total) by Subdermal route once for 1 dose. Administered 12/31/2020 (Patient not taking: Reported on 02/12/2022)   [DISCONTINUED] FLUoxetine (PROZAC) 20 MG tablet Take 2 tablets (40 mg total) by mouth daily. Needs appt for additional refills (Patient not taking: Reported on 02/12/2022)   [DISCONTINUED] rizatriptan (MAXALT-MLT) 10 MG disintegrating tablet Take 1 tablet (10 mg total) by mouth as needed. May  repeat in 2 hours if needed (Patient not taking: Reported on 02/12/2022)   [DISCONTINUED] topiramate (TOPAMAX) 50 MG tablet Take 2 tablets (100 mg total) by mouth 2 (two) times daily. (Patient not taking: Reported on 02/12/2022)   No facility-administered medications prior to visit.   Reviewed past medical and social history.   ROS per HPI above      Objective:  BP 126/76 (BP Location: Right Arm, Patient Position: Sitting, Cuff Size: Normal)   Pulse 75   Temp (!) 97 F (36.1 C) (Temporal)   Ht '5\' 6"'$  (1.676 m)   Wt 240 lb 12.8 oz (109.2 kg)   SpO2 99%   BMI 38.87 kg/m      Physical Exam Neurological:     Mental Status: She is alert.  Psychiatric:        Attention and Perception: Attention normal.        Mood and Affect: Mood normal.        Speech: Speech normal.        Behavior: Behavior normal. Behavior is cooperative.        Thought Content: Thought content normal.        Cognition and Memory: Cognition normal.    Results for orders placed or performed in visit on 02/12/22  POCT glycosylated hemoglobin (Hb A1C)  Result Value Ref Range   Hemoglobin A1C 5.7 (A) 4.0 - 5.6 %      Assessment & Plan:    Problem List Items Addressed This Visit       Other   ADHD, predominantly inattentive type -  Primary    concerta prescribed by psychiatry Needs referral to new psychiatrist Per PMP last filled: 12/30/21, 11/28/21, 05/01/21  Entered psychiatry referral Advised to maintain appt with current psychiatrist till able to get appt with new provider. Advised Ms. Santore that Concerta refill will be provided by psychiatry only due to underlying uncontrolled depression and anxiety.       Relevant Orders   Ambulatory referral to Psychiatry   Class 2 severe obesity due to excess calories with serious comorbidity and body mass index (BMI) of 36.0 to 36.9 in adult [facility])   Depression, recurrent (HCC)    Ongoing CBT sessions every other week with some improvement. Also under the  care of psychiatrist: Chales Abrahams, NP in [place], [place] She is requesting referral to another psychiatrist. No medication used at this time. Has thoughts about death and self worth, but no intention or plan to hurts herself or anyone else.  Entered psychiatry referral       History of gestational diabetes mellitus   Other Visit Diagnoses     Hyperglycemia       Relevant Orders   POCT glycosylated hemoglobin (Hb A1C) (Completed)      Return in about 3 months (around 05/15/2022) for CPE (fasting).     Wilfred Lacy, NP

## 2022-02-27 ENCOUNTER — Ambulatory Visit: Payer: BC Managed Care – PPO | Admitting: Psychology

## 2022-02-27 ENCOUNTER — Emergency Department (HOSPITAL_COMMUNITY)
Admission: EM | Admit: 2022-02-27 | Discharge: 2022-02-27 | Disposition: A | Discharge status: Home / Self Care | Payer: BC Managed Care – PPO | Attending: Emergency Medicine | Admitting: Emergency Medicine

## 2022-02-27 DIAGNOSIS — Y9239 Other specified sports and athletic area as the place of occurrence of the external cause: Secondary | ICD-10-CM | POA: Diagnosis not present

## 2022-02-27 DIAGNOSIS — X500XXA Overexertion from strenuous movement or load, initial encounter: Secondary | ICD-10-CM | POA: Diagnosis not present

## 2022-02-27 DIAGNOSIS — S39012A Strain of muscle, fascia and tendon of lower back, initial encounter: Secondary | ICD-10-CM | POA: Diagnosis not present

## 2022-02-27 DIAGNOSIS — Y9389 Activity, other specified: Secondary | ICD-10-CM | POA: Diagnosis not present

## 2022-02-27 DIAGNOSIS — M549 Dorsalgia, unspecified: Secondary | ICD-10-CM | POA: Diagnosis not present

## 2022-02-27 MED ORDER — LIDOCAINE 5 % EX PTCH
1.0000 | MEDICATED_PATCH | CUTANEOUS | Status: DC
  Administered 2022-02-27: 1 via TRANSDERMAL
  Filled 2022-02-27: qty 1

## 2022-02-27 MED ORDER — LIDOCAINE 5 % EX PTCH: 1.0000 | MEDICATED_PATCH | CUTANEOUS | 0 refills | Status: DC

## 2022-02-27 MED ORDER — METHOCARBAMOL 500 MG PO TABS: 500.0000 mg | ORAL_TABLET | Freq: Two times a day (BID) | ORAL | 0 refills | Status: DC

## 2022-02-27 MED ORDER — IBUPROFEN 800 MG PO TABS: 800.0000 mg | ORAL_TABLET | Freq: Three times a day (TID) | ORAL | 0 refills | Status: AC

## 2022-02-27 NOTE — ED Triage Notes (Signed)
Pt states that yesterday she was working out lifting weights and she injured her lower back. Pt is able to move all four limbs freely, but states it is now difficult to bear weight. Pt denies fall, loss of bowel/bladder, saddle anesthesia.

## 2022-02-27 NOTE — Discharge Instructions (Addendum)
Rest, ice affected area.  Take ibuprofen every 8 hours for the next 5 to 7 days engage response.  I have provided information regarding an orthopedic to follow-up with in approximately 5 to 7 days if your symptoms do not improve.  Try to find a comfortable position to study in while you are in class.  I also provided Lidoderm patches as well as a muscle relaxer to use as needed.  Note that the muscle laxer can cause drowsiness so please do not drive while taking this medication.  Stretching exercises are also beneficial in injuries like this.  Make sure not to weight-lift until you are no longer experiencing pain.  Please do not hesitate to return to the emergency department if the worrisome signs and symptoms we discussed become apparent.

## 2022-02-27 NOTE — ED Provider Notes (Signed)
[facility] DEPT Provider Note   CSN: [id number] Arrival date & time: 02/27/22  1008     History  Chief Complaint  Patient presents with   Back Pain    [name] is a 36 y.o. female.   Back Pain Associated symptoms: no abdominal pain, no chest pain, no dysuria and no fever     36 year old female presents emergency department with back pain.  Patient states the back pain began yesterday when she was at the gymnasium lifting weights.  She states that she did multiple leg exercises that involved bending over.  She does not point to one specific moment in route workout routine because the pain, but began to experience the pain afterwards.  She notes just getting back into the gym and was trying to do restart a regular workout routine when this occurred.  Denies falls, trauma to affected area.  Complaining of pain in the lumbar area.  Legs are secondarily weak/painful because of the workout.  She tried 1 dose of ibuprofen which has not helped her symptoms.  Denies fever, IV drug use, recent steroid use, known cancer diagnosis, saddle anesthesia, bowel/bladder dysfunction, back surgery.  Past Medical History:  Diagnosis Date   Chicken pox    Headaches    Hx of migraines    Hx of varicella    Past Surgical History:  Procedure Laterality Date   CESAREAN SECTION  2003 & 2006   CESAREAN SECTION N/A 04/30/2014   Procedure: REPEAT CESAREAN SECTION;  Surgeon: Allyn Kenner, DO;  Location: [facility];  Service: Obstetrics;  Laterality: N/A;   CESAREAN SECTION N/A 05/15/2016   Procedure: CESAREAN SECTION;  Surgeon: Allyn Kenner, DO;  Location: [facility];  Service: Obstetrics;  Laterality: N/A;   CHOLECYSTECTOMY     TUBAL LIGATION Bilateral 05/15/2016   Procedure: BILATERAL TUBAL LIGATION;  Surgeon: Allyn Kenner, DO;  Location: [facility];  Service: Obstetrics;  Laterality: Bilateral;    Home Medications Prior to Admission medications    Medication Sig Start Date End Date Taking? Authorizing Provider  ibuprofen (ADVIL) 800 MG tablet Take 1 tablet (800 mg total) by mouth 3 (three) times daily. 02/27/22  Yes Dion Saucier A, PA  lidocaine (LIDODERM) 5 % Place 1 patch onto the skin daily. Remove & Discard patch within 12 hours or as directed by MD 02/27/22  Yes Dion Saucier A, PA  methocarbamol (ROBAXIN) 500 MG tablet Take 1 tablet (500 mg total) by mouth 2 (two) times daily. 02/27/22  Yes Dion Saucier A, PA  methylphenidate 54 MG PO CR tablet Take 1 tablet (54 mg total) by mouth every morning. 01/03/21   Nche, Charlene Brooke, NP  ondansetron (ZOFRAN ODT) 4 MG disintegrating tablet Take 1 tablet (4 mg total) by mouth every 8 (eight) hours as needed for nausea or vomiting. 11/10/20   Barrie Folk, PA-C      Allergies    Patient has no known allergies.    Review of Systems   Review of Systems  Constitutional:  Negative for chills and fever.  HENT:  Negative for ear pain and sore throat.   Eyes:  Negative for pain and visual disturbance.  Respiratory:  Negative for cough and shortness of breath.   Cardiovascular:  Negative for chest pain and palpitations.  Gastrointestinal:  Negative for abdominal pain and vomiting.  Genitourinary:  Negative for dysuria and hematuria.  Musculoskeletal:  Positive for back pain.  Skin:  Negative for color change and rash.  Neurological:  Negative for seizures and syncope.  All other systems reviewed and are negative.   Physical Exam Updated Vital Signs BP 118/72   Pulse (!) 128   Temp 98.4 F (36.9 C) (Oral)   Resp 16   SpO2 100%  Physical Exam Vitals and nursing note reviewed.  Constitutional:      General: She is not in acute distress.    Appearance: She is well-developed. She is obese. She is not ill-appearing or diaphoretic.  HENT:     Head: Normocephalic and atraumatic.  Eyes:     Conjunctiva/sclera: Conjunctivae normal.  Cardiovascular:     Rate and Rhythm: Normal  rate and regular rhythm.     Heart sounds: No murmur heard. Pulmonary:     Effort: Pulmonary effort is normal. No respiratory distress.     Breath sounds: Normal breath sounds.  Abdominal:     Palpations: Abdomen is soft.     Tenderness: There is no abdominal tenderness.  Musculoskeletal:        General: No swelling. Normal range of motion.     Cervical back: Normal and neck supple.     Thoracic back: Normal.     Lumbar back: Tenderness present. Negative right straight leg raise test and negative left straight leg raise test.     Right lower leg: No edema.     Left lower leg: No edema.     Comments: Left paraspinal tenderness to palpation in the lumbar area.  Tenderness to palpation also in the area of the piriformis on the left side.  No midline tenderness.  No right-sided paraspinal tenderness.  No overlying skin abnormalities noted.  Dorsalis pedis pulses full and intact bilaterally.  No sensory deficits along major nerve distributions of lower extremities.  Muscle strength 5-5.  No signs for compartment syndrome.  Skin:    General: Skin is warm and dry.     Capillary Refill: Capillary refill takes less than 2 seconds.  Neurological:     Mental Status: She is alert.  Psychiatric:        Mood and Affect: Mood normal.     ED Results / Procedures / Treatments   Labs (all labs ordered are listed, but only abnormal results are displayed) Labs Reviewed - No data to display  EKG None  Radiology No results found.  Procedures Procedures    Medications Ordered in ED Medications  lidocaine (LIDODERM) 5 % 1 patch (1 patch Transdermal Patch Applied 02/27/22 1043)    ED Course/ Medical Decision Making/ A&P                           Medical Decision Making  This patient presents to the ED for concern of back pain, this involves an extensive number of treatment options, and is a complaint that carries with it a high risk of complications and morbidity.  The differential diagnosis  includes The emergent differential diagnosis for back pain includes but is not limited to fracture, muscle strain, cauda equina, spinal stenosis. DDD, ankylosing spondylitis, acute ligamentous injury, disk herniation, spondylolisthesis, Epidural compression syndrome, metastatic cancer, transverse myelitis, vertebral osteomyelitis, diskitis, kidney stone, pyelonephritis, AAA, Perforated ulcer, Retrocecal appendicitis, pancreatitis, bowel obstruction, retroperitoneal hemorrhage or mass, meningitis.   Co morbidities that complicate the patient evaluation  Obesity, headaches   Additional history obtained:  Additional history obtained from daughter who is at bedside   Lab Tests:  N/a   Imaging Studies ordered:  N/a  Cardiac Monitoring: / EKG:  The patient was maintained on a cardiac monitor.  I personally viewed and interpreted the cardiac monitored which showed an underlying rhythm of: Sinus rhythm   Consultations Obtained:  N/a   Problem List / ED Course / Critical interventions / Medication management  Back pain I ordered medication including methadone for pain  Reevaluation of the patient after these medicines showed that the patient improved I have reviewed the patients home medicines and have made adjustments as needed   Social Determinants of Health:  Denies tobacco, illicit drug use   Test / Admission - Considered:  Back pain Vitals signs within normal range and stable throughout visit. Laboratory/imaging studies considered but deemed unnecessary due to mechanism of injury, lack of known trauma, reassuring physical exam Doubt cauda equina given lack of red flag signs.  Patient endorsing no urinary symptoms as well as abdominal pain so doubt [place] or kidney stone. Patient recommended symptomatic therapy with rest, ice, NSAIDs.  Muscle laxer and Lidoderm patch was prescribed to take as needed.  Follow-up information with orthopedics given if the patient does not  get better or gets worse. Worrisome signs and symptoms were discussed with the patient, and the patient acknowledged understanding to return to the ED if noticed. Patient was stable upon discharge.          Final Clinical Impression(s) / ED Diagnoses Final diagnoses:  Strain of lumbar region, initial encounter    Rx / DC Orders ED Discharge Orders          Ordered    methocarbamol (ROBAXIN) 500 MG tablet  2 times daily        02/27/22 1040    ibuprofen (ADVIL) 800 MG tablet  3 times daily        02/27/22 1040    lidocaine (LIDODERM) 5 %  Every 24 hours        02/27/22 1040              Wilnette Kales, [place] 02/27/22 1111    Lucrezia Starch, MD 03/01/22 1032

## 2022-03-13 ENCOUNTER — Ambulatory Visit (INDEPENDENT_AMBULATORY_CARE_PROVIDER_SITE_OTHER): Payer: BC Managed Care – PPO | Admitting: Psychology

## 2022-03-13 DIAGNOSIS — F900 Attention-deficit hyperactivity disorder, predominantly inattentive type: Secondary | ICD-10-CM

## 2022-03-13 DIAGNOSIS — F322 Major depressive disorder, single episode, severe without psychotic features: Secondary | ICD-10-CM | POA: Diagnosis not present

## 2022-03-23 ENCOUNTER — Ambulatory Visit (INDEPENDENT_AMBULATORY_CARE_PROVIDER_SITE_OTHER): Payer: BC Managed Care – PPO | Admitting: Psychology

## 2022-03-23 DIAGNOSIS — F900 Attention-deficit hyperactivity disorder, predominantly inattentive type: Secondary | ICD-10-CM | POA: Diagnosis not present

## 2022-03-23 DIAGNOSIS — F322 Major depressive disorder, single episode, severe without psychotic features: Secondary | ICD-10-CM

## 2022-03-23 NOTE — Progress Notes (Signed)
[place] Counselor/Therapist Progress Note  Patient ID: [name], MRN: [id number]   Date: 03/23/22  Time Spent: 2:02 pm - 2:57 pm : 55 Minutes  Treatment Type: Individual Therapy.  Reported Symptoms: Depression and anxiety.  Mental Status Exam: Appearance:  Neat and Well Groomed     Behavior: Appropriate  Motor: Normal  Speech/Language:  Negative  Affect: Congruent  Mood: normal  Thought process: normal  Thought content:   WNL  Sensory/Perceptual disturbances:   WNL  Orientation: oriented to person, place, time/date, and situation  Attention: Good  Concentration: Good  Memory: WNL  Fund of knowledge:  Good  Insight:   Good  Judgment:  Good  Impulse Control: Good   Risk Assessment: Danger to Self:  No Self-injurious Behavior: No Danger to Others: No Duty to Warn:no Physical Aggression / Violence:No  Access to Firearms a concern: No  Gang Involvement:No   In case of a mental health emergency:  30 - confidential suicide hotline. [facility]):        [place], [place] [address]       [phone] 3.   911  4.   Visiting Nearest ED.    Subjective:   [name] participated from home, via video and consented to treatment. Therapist participated from home office. We met online due to [name] pandemic. [name] reviewed the events of the past week. She noted continued car struggles and noted feeling short and feeling temperamental. She noted having to apologize to her family for her irritability. We worked on re-scripting this experience during the session and identifying additional ways to manage this going forward. She noted her fears that she is turning into her mother. She described her mother's parenting style. She discussed the difficulties of parenting and noted the feeling of being "ashamed" for being firm with her children during a stressful time. She noted facing financial pressure to fix or  replace the care. She noted feelings of dread and uncertainty. We explored this during the session. We discussed ways to build buffers, communicate expectations, limit negative self-talk, and manage frustration more proactively.  Therapist modeled this during the session.Therapist validated [name]'s feelings and provided supportive therapy. A follow-up was scheduled. [name] continues to benefit from therapy.   Interventions: CBT.   Diagnosis:  Depression, major, single episode, severe (HCC)  ADHD, predominantly inattentive type   Treatment Plan:  Client Abilities/Strengths [name] is intelligent, forthcoming, and motivated for change.   Support System: Family, husband.   Client Treatment Preferences Outpatient Therapy.   Client Statement of Needs [name] would like to decrease rumination, think more positively, maintain positive behaviors (exercise), and manage overall mood.    Treatment Level Weekly  Symptoms  Depression: Low self-esteem, negative self-talk, negative automatic thoughts, infrequent self-care, feelings of worthlessness, sadness.    (Status: improved) Anxiety: Consistent worry, difficulty managing worry, tension, irritation  (Status: improved)  Goals:   [name] experiences symptoms of depression, anxiety, and ADHD.    Target Date: 10/31/22 Frequency: Weekly  Progress: 0 Modality: individual    Therapist will provide referrals for additional resources as appropriate.  Therapist will provide psycho-education regarding [name] diagnosis and corresponding treatment approaches and interventions. Licensed Clinical Social Worker, [place], LCSW will support the patient's ability to achieve the goals identified. will employ CBT, BA, Problem-solving, Solution Focused, Mindfulness,  coping skills, & other evidenced-based practices will be used to promote progress towards healthy functioning to help manage decrease symptoms associated with her  diagnosis.   Reduce  overall level, frequency, and intensity of the feelings of depression and anxiety evidenced by decreased overall symptoms including rumination, negative self-talk, negative automatic thoughts, depressive moods/symptoms from 6 to 7 days/week to 0 to 1 days/week per client report for at least 3 consecutive months. Verbally express understanding of the relationship between feelings of depression, anxiety and their impact on thinking patterns and behaviors. Verbalize an understanding of the role that distorted thinking plays in creating fears, excessive worry, and ruminations.    [name] participated in the creation of the treatment plan)   Mary Irish, LCSW

## 2022-04-04 ENCOUNTER — Ambulatory Visit (HOSPITAL_BASED_OUTPATIENT_CLINIC_OR_DEPARTMENT_OTHER): Payer: BC Managed Care – PPO | Admitting: Psychiatry

## 2022-04-04 ENCOUNTER — Encounter (HOSPITAL_COMMUNITY): Payer: Self-pay | Admitting: Psychiatry

## 2022-04-04 DIAGNOSIS — F321 Major depressive disorder, single episode, moderate: Secondary | ICD-10-CM | POA: Diagnosis not present

## 2022-04-04 DIAGNOSIS — F900 Attention-deficit hyperactivity disorder, predominantly inattentive type: Secondary | ICD-10-CM | POA: Diagnosis not present

## 2022-04-04 DIAGNOSIS — F411 Generalized anxiety disorder: Secondary | ICD-10-CM

## 2022-04-04 DIAGNOSIS — F4010 Social phobia, unspecified: Secondary | ICD-10-CM | POA: Diagnosis not present

## 2022-04-04 MED ORDER — LISDEXAMFETAMINE DIMESYLATE 30 MG PO CAPS: 30.0000 mg | ORAL_CAPSULE | Freq: Every day | ORAL | 0 refills | Status: DC
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
## 2022-04-06 ENCOUNTER — Telehealth (HOSPITAL_COMMUNITY): Payer: Self-pay | Admitting: *Deleted

## 2022-04-06 NOTE — Telephone Encounter (Signed)
PA FOR VYVANSE 30 MG CAPS SUBMITTED VIA COVER MY MEDS.  KEY [id number].  RX# [id number], NO PA CASE ID GIVEN.  AWAITING DETERMINATION.

## 2022-04-16 ENCOUNTER — Telehealth (HOSPITAL_BASED_OUTPATIENT_CLINIC_OR_DEPARTMENT_OTHER): Payer: BC Managed Care – PPO | Admitting: Psychiatry

## 2022-04-16 DIAGNOSIS — F900 Attention-deficit hyperactivity disorder, predominantly inattentive type: Secondary | ICD-10-CM | POA: Diagnosis not present

## 2022-04-16 NOTE — Progress Notes (Signed)
Virtual Visit via Video Note  I connected with [name] on 04/16/22 at  4:00 PM EDT by   a video enabled telemedicine application and verified that I am speaking with the correct person using two identifiers.  Location: Patient: home Provider: office   I discussed the limitations of evaluation and management by telemedicine and the availability of in person appointments. The patient expressed understanding and agreed to proceed.  History of Present Illness: [name] has had trouble getting the Concerta filled. Her insurance company has not approved it yet. She is not taking any stimulant so her ADHD symptoms are all the same. She continues to meet her therapist to work on depression and anxiety. She denies SI/HI.    Observations/Objective: Psychiatric Specialty Exam: ROS  unknown if currently breastfeeding.There is no height or weight on file to calculate BMI.  General Appearance: Fairly Groomed and Neat  Eye Contact:  Good  Speech:  Clear and Coherent and Normal Rate  Volume:  Normal  Mood:  Euthymic  Affect:  Full Range  Thought Process:  Goal Directed, Linear, and Descriptions of Associations: Intact  Orientation:  Full (Time, Place, and Person)  Thought Content:  Logical  Suicidal Thoughts:  No  Homicidal Thoughts:  No  Memory:  Immediate;   Good  Judgement:  Good  Insight:  Good  Psychomotor Activity:  Normal  Concentration:  Concentration: Good  Recall:  Good  Fund of Knowledge:  Good  Language:  Good  Akathisia:  No  Handed:  Right  AIMS (if indicated):     Assets:  Communication Skills Desire for Improvement Financial Resources/Insurance Housing Resilience Social Support Talents/Skills Transportation Vocational/Educational  ADL's:  Intact  Cognition:  WNL  Sleep:        Assessment and Plan:     04/04/2022    9:41 AM 02/12/2022   11:28 AM 02/16/2020    9:39 AM 01/29/2020   11:00 AM 01/29/2020   10:07 AM  Depression screen PHQ 2/9  Decreased Interest  '1 1 1 2 1  '$ Down, Depressed, Hopeless '1 2 2 2 1  '$ PHQ - 2 Score '2 3 3 4 2  '$ Altered sleeping 2 1 0 3   Tired, decreased energy '3 1 2 2   '$ Change in appetite '3 1 2 3   '$ Feeling bad or failure about yourself  '1 2 2 1   '$ Trouble concentrating 0 '3 2 2   '$ Moving slowly or fidgety/restless 0 1 0 1   Suicidal thoughts '1 1 1 2   '$ PHQ-9 Score '12 13 12 18   '$ Difficult doing work/chores Very difficult Somewhat difficult Extremely dIfficult      Flowsheet Row Office Visit from 04/04/2022 in [facility] from 11/10/2020 in [facility] ED from 11/09/2020 in [facility] Error: Q3, 4, or 5 should not be populated when Q2 is No No Risk No Risk        Status of current problems: ongoing ADHD  Meds:  1. ADHD, predominantly inattentive type   - my nurse will contact her insurance company to get approval for Concerta   Labs: none    Therapy: brief supportive therapy provided    Collaboration of Care: Other none  Patient/Guardian was advised Release of Information must be obtained prior to any record release in order to collaborate their care with an outside provider. Patient/Guardian was advised if they have not already done so  to contact the registration department to sign all necessary forms in order for [place] to release information regarding their care.   Consent: Patient/Guardian gives verbal consent for treatment and assignment of benefits for services provided during this visit. Patient/Guardian expressed understanding and agreed to proceed.   Follow Up Instructions: Follow up in 2-3 months in person or sooner if needed    I discussed the assessment and treatment plan with the patient. The patient was provided an opportunity to ask questions and all were answered. The patient agreed with the plan and demonstrated an understanding of the instructions.   The patient  was advised to call back or seek an in-person evaluation if the symptoms worsen or if the condition fails to improve as anticipated.  I provided 5 minutes of non-face-to-face time during this encounter.   Charlcie Cradle, MD

## 2022-04-20 ENCOUNTER — Ambulatory Visit (INDEPENDENT_AMBULATORY_CARE_PROVIDER_SITE_OTHER): Payer: BC Managed Care – PPO | Admitting: Psychology

## 2022-04-20 DIAGNOSIS — F339 Major depressive disorder, recurrent, unspecified: Secondary | ICD-10-CM | POA: Diagnosis not present

## 2022-04-20 DIAGNOSIS — F900 Attention-deficit hyperactivity disorder, predominantly inattentive type: Secondary | ICD-10-CM

## 2022-04-20 NOTE — Progress Notes (Signed)
[facility] Counselor/Therapist Progress Note  Patient ID: [name], MRN: [id number]   Date: 04/20/22  Time Spent: 12:01 pm - 12:51 pm : 50 Minutes  Treatment Type: Individual Therapy.  Reported Symptoms: Depression and anxiety.  Mental Status Exam: Appearance:  Neat and Well Groomed     Behavior: Appropriate  Motor: Normal  Speech/Language:  Normal Rate  Affect: Depressed  Mood: depressed  Thought process: normal  Thought content:   WNL  Sensory/Perceptual disturbances:   WNL  Orientation: oriented to person, place, time/date, and situation  Attention: Good  Concentration: Good  Memory: WNL  Fund of knowledge:  Good  Insight:   Good  Judgment:  Good  Impulse Control: Good   Risk Assessment: Danger to Self:  No Self-injurious Behavior: No Danger to Others: No Duty to Warn:no Physical Aggression / Violence:No  Access to Firearms a concern: No  Gang Involvement:No   In case of a mental health emergency:  103 - confidential suicide hotline. [facility]):        [place], [place] [address]       [phone] 3.   911  4.   Visiting Nearest ED.    Subjective:   [name] participated from home, via video and consented to treatment. Therapist participated from home office. We met online due to [place] pandemic. Akhila reviewed the events of the past week. [name] met with her prescriber, Dr. Robet Leu, who prescribed Vyvanse 30 mg. Tegan is taking her medication consistently and noted that her medication typically works from [facility]. She has a scheduled follow-up with her psychiatrist in August. She noted feeling overwhelmed by her husband's request to focus on studies with their child for her educational needs. She identified negative self-talk, "not being good enough", in response to this request. We explored this during the session and ways to challenge this, which therapist modeled. We explored her  expectations for self and the lack of verbalization of her own needs to others. Therapist validated Meliya's feelings and provided supportive therapy. A follow-up was scheduled. Neisha continues to benefit from therapy.   Interventions: CBT.   Diagnosis:  ADHD, predominantly inattentive type  Depression, recurrent ([place])   Treatment Plan:  Client Abilities/Strengths Gerald is intelligent, forthcoming, and motivated for change.   Support System: Family, husband.   Client Treatment Preferences Outpatient Therapy.   Client Statement of Needs Karia would like to decrease rumination, think more positively, maintain positive behaviors (exercise), and manage overall mood.    Treatment Level Weekly  Symptoms  Depression: Low self-esteem, negative self-talk, negative automatic thoughts, infrequent self-care, feelings of worthlessness, sadness.    (Status: declined) Anxiety: Consistent worry, difficulty managing worry, tension, irritation  (Status: maintained)  Goals:   Silvie experiences symptoms of depression, anxiety, and ADHD.    Target Date: 10/31/22 Frequency: Weekly  Progress: 0 Modality: individual    Therapist will provide referrals for additional resources as appropriate.  Therapist will provide psycho-education regarding Nyssa's diagnosis and corresponding treatment approaches and interventions. Licensed Clinical Social Worker, [place], LCSW will support the patient's ability to achieve the goals identified. will employ CBT, BA, Problem-solving, Solution Focused, Mindfulness,  coping skills, & other evidenced-based practices will be used to promote progress towards healthy functioning to help manage decrease symptoms associated with her diagnosis.   Reduce overall level, frequency, and intensity of the feelings of depression and anxiety evidenced by decreased overall symptoms including rumination, negative self-talk, negative automatic thoughts, depressive  moods/symptoms from 6 to 7 days/week to 0 to 1 days/week per client report for at least 3 consecutive months. Verbally express understanding of the relationship between feelings of depression, anxiety and their impact on thinking patterns and behaviors. Verbalize an understanding of the role that distorted thinking plays in creating fears, excessive worry, and ruminations.    [name] participated in the creation of the treatment plan)   Buena Irish, LCSW

## 2022-05-05 ENCOUNTER — Ambulatory Visit (INDEPENDENT_AMBULATORY_CARE_PROVIDER_SITE_OTHER): Payer: BC Managed Care – PPO | Admitting: Psychology

## 2022-05-05 DIAGNOSIS — F411 Generalized anxiety disorder: Secondary | ICD-10-CM

## 2022-05-05 DIAGNOSIS — F339 Major depressive disorder, recurrent, unspecified: Secondary | ICD-10-CM | POA: Diagnosis not present

## 2022-05-05 DIAGNOSIS — F900 Attention-deficit hyperactivity disorder, predominantly inattentive type: Secondary | ICD-10-CM | POA: Diagnosis not present

## 2022-05-05 NOTE — Progress Notes (Signed)
[facility] Counselor/Therapist Progress Note  Patient ID: [name], MRN: [id number]   Date: 05/05/22  Time Spent: 9:01 am - 10:02 am : 61 Minutes  Treatment Type: Individual Therapy.  Reported Symptoms: Depression and anxiety.  Mental Status Exam: Appearance:  Neat and Well Groomed     Behavior: Appropriate  Motor: Normal  Speech/Language:  Normal Rate  Affect: Depressed  Mood: depressed  Thought process: normal  Thought content:   WNL  Sensory/Perceptual disturbances:   WNL  Orientation: oriented to person, place, time/date, and situation  Attention: Good  Concentration: Good  Memory: WNL  Fund of knowledge:  Good  Insight:   Good  Judgment:  Good  Impulse Control: Good   Risk Assessment: Danger to Self:  No Self-injurious Behavior: No Danger to Others: No Duty to Warn:no Physical Aggression / Violence:No  Access to Firearms a concern: No  Gang Involvement:No   In case of a mental health emergency:  12 - confidential suicide hotline. [facility]):        [place], [place] [address]       [phone] 3.   911  4.   Visiting Nearest ED.    Subjective:   [name] participated from home, via video and consented to treatment. Therapist participated from office. We met online due to [place] pandemic. Emma reviewed the events of the past week. [name] noted feeling overwhelmed by day-to-day including household tasks and caring for their children and feeling overstimulated. She noted cutting herself and endorsed SI at the time (August 9th) but denied any recent self-harm but has a history of around age 36. We reviewed safety during the session and [name] was agreeable to maintaining her own safety. Therapist encouraged [name] to contact her prescriber, today, to address concerns. We discussed the importance of follow-up to discuss mood and a possible medication adjustment for her ADHD stimulant.  [name] noted catastrophizing and jumping to conclusions. We explored this and therapist employed CBT skills to identify distortions and modeling ways to challenge them via evidence. Additional stressors for [name] include  include school deadlines, getting the children's tasks, completing household tasks, and stress between her eldest two children. We identified ways to manage stressors and engaged in problem-solving to address concerns. [name] agreed to maintaining her safety and was provided safety protocol should the need arise. We discussed more healthy and adaptive ways to manage distress including communication to others, healthy distractions, engaging in relaxing activities . Therapist validated [name]'s feelings and provided supportive therapy. A follow-up was scheduled. [name] continues to benefit from therapy.   Interventions: CBT.   Diagnosis:  ADHD, predominantly inattentive type  Depression, recurrent ([place])  GAD (generalized anxiety disorder)   Treatment Plan:  Client Abilities/Strengths [name] is intelligent, forthcoming, and motivated for change.   Support System: Family, husband.   Client Treatment Preferences Outpatient Therapy.   Client Statement of Needs [name] would like to decrease rumination, think more positively, maintain positive behaviors (exercise), and manage overall mood.    Treatment Level Weekly  Symptoms  Depression: Low self-esteem, negative self-talk, negative automatic thoughts, infrequent self-care, feelings of worthlessness, sadness.    (Status: declined) Anxiety: Consistent worry, difficulty managing worry, tension, irritation  (Status: maintained)  Goals:   [name] experiences symptoms of depression, anxiety, and ADHD.    Target Date: 10/31/22 Frequency: Weekly  Progress: 0 Modality: individual    Therapist will provide referrals for additional resources as appropriate.  Therapist will provide  psycho-education regarding [name]'s  diagnosis and corresponding treatment approaches and interventions. Licensed Clinical Social Worker, [place], LCSW will support the patient's ability to achieve the goals identified. will employ CBT, BA, Problem-solving, Solution Focused, Mindfulness,  coping skills, & other evidenced-based practices will be used to promote progress towards healthy functioning to help manage decrease symptoms associated with her diagnosis.   Reduce overall level, frequency, and intensity of the feelings of depression and anxiety evidenced by decreased overall symptoms including rumination, negative self-talk, negative automatic thoughts, depressive moods/symptoms from 6 to 7 days/week to 0 to 1 days/week per client report for at least 3 consecutive months. Verbally express understanding of the relationship between feelings of depression, anxiety and their impact on thinking patterns and behaviors. Verbalize an understanding of the role that distorted thinking plays in creating fears, excessive worry, and ruminations.    [name] participated in the creation of the treatment plan)   Buena Irish, LCSW

## 2022-05-13 ENCOUNTER — Ambulatory Visit (INDEPENDENT_AMBULATORY_CARE_PROVIDER_SITE_OTHER): Payer: BC Managed Care – PPO | Admitting: Psychology

## 2022-05-13 DIAGNOSIS — F900 Attention-deficit hyperactivity disorder, predominantly inattentive type: Secondary | ICD-10-CM

## 2022-05-13 DIAGNOSIS — F339 Major depressive disorder, recurrent, unspecified: Secondary | ICD-10-CM | POA: Diagnosis not present

## 2022-05-13 DIAGNOSIS — F411 Generalized anxiety disorder: Secondary | ICD-10-CM

## 2022-05-13 NOTE — Progress Notes (Signed)
[facility] Counselor/Therapist Progress Note  Patient ID: [name], MRN: [id number]   Date: 05/13/22  Time Spent: 11:06 am - 11:53 am : 47 Minutes  Treatment Type: Individual Therapy.  Reported Symptoms: Depression and anxiety.  Mental Status Exam: Appearance:  Neat and Well Groomed     Behavior: Appropriate  Motor: Normal  Speech/Language:  Normal Rate  Affect: Appropriate  Mood: dysthymic  Thought process: normal  Thought content:   WNL  Sensory/Perceptual disturbances:   WNL  Orientation: oriented to person, place, time/date, and situation  Attention: Good  Concentration: Good  Memory: WNL  Fund of knowledge:  Good  Insight:   Good  Judgment:  Good  Impulse Control: Good   Risk Assessment: Danger to Self:  No Self-injurious Behavior: No Danger to Others: No Duty to Warn:no Physical Aggression / Violence:No  Access to Firearms a concern: No  Gang Involvement:No   In case of a mental health emergency:  988 - confidential suicide hotline. [facility]        [address] [place], [place] [address]       [phone] 3.   911  4.   Visiting Nearest ED.    Subjective:   [name] participated from home, via video and consented to treatment. Therapist participated from office. We met online due to COVID pandemic. [name] reviewed the events of the past week. She noted her mood improving after having a conversation with her husband regarding the division of labor. She noted her attempts to get organized during the week. She noted "I don't know why I feel like I have to bottle everything up".  She noted continued stress in relation to sharing how she feels with her husband but noted this going well previously. Therapist encouraged [name] to set a time, weekly, to discuss stressors, concerns, upcoming events, and needs. We discussed ways to manage her discomfort and rely on positive thinking, challenging  negative self-talk, and committing to communicating needs consistently. Therapist encouraged [name] to write down her needs, as they arise and to communicate them to her husband during their weekly meetings. Therapist validated [name]'s feelings and provided supportive therapy. A follow-up was scheduled. [name] continues to benefit from therapy.   Interventions: CBT.   Diagnosis:  ADHD, predominantly inattentive type  Depression, recurrent ([facility])  GAD (generalized anxiety disorder)   Treatment Plan:  Client Abilities/Strengths [name] is intelligent, forthcoming, and motivated for change.   Support System: Family, husband.   Client Treatment Preferences Outpatient Therapy.   Client Statement of Needs [name] would like to decrease rumination, think more positively, maintain positive behaviors (exercise), and manage overall mood.    Treatment Level Weekly  Symptoms  Depression: Low self-esteem, negative self-talk, negative automatic thoughts, infrequent self-care, feelings of worthlessness, sadness.    (Status: declined) Anxiety: Consistent worry, difficulty managing worry, tension, irritation  (Status: maintained)  Goals:   [name] experiences symptoms of depression, anxiety, and ADHD.    Target Date: 10/31/22 Frequency: Weekly  Progress: 0 Modality: individual    Therapist will provide referrals for additional resources as appropriate.  Therapist will provide psycho-education regarding [name]'s diagnosis and corresponding treatment approaches and interventions. Licensed Clinical Social Worker, Osman Mostafa, LCSW will support the patient's ability to achieve the goals identified. will employ CBT, BA, Problem-solving, Solution Focused, Mindfulness,  coping skills, & other evidenced-based practices will be used to promote progress towards healthy functioning to help manage decrease symptoms associated with her diagnosis.     Reduce overall level, frequency, and intensity of the  feelings of depression and anxiety evidenced by decreased overall symptoms including rumination, negative self-talk, negative automatic thoughts, depressive moods/symptoms from 6 to 7 days/week to 0 to 1 days/week per client report for at least 3 consecutive months. Verbally express understanding of the relationship between feelings of depression, anxiety and their impact on thinking patterns and behaviors. Verbalize an understanding of the role that distorted thinking plays in creating fears, excessive worry, and ruminations.    ([name] participated in the creation of the treatment plan)   Osman Mostafa, LCSW 

## 2022-05-15 ENCOUNTER — Ambulatory Visit (INDEPENDENT_AMBULATORY_CARE_PROVIDER_SITE_OTHER): Payer: BC Managed Care – PPO | Admitting: Nurse Practitioner

## 2022-05-15 ENCOUNTER — Encounter: Payer: Self-pay | Admitting: Nurse Practitioner

## 2022-05-15 VITALS — BP 130/80 | HR 84 | Temp 96.4°F | Ht 66.0 in | Wt 236.4 lb

## 2022-05-15 DIAGNOSIS — Z13220 Encounter for screening for lipoid disorders: Secondary | ICD-10-CM

## 2022-05-15 DIAGNOSIS — R739 Hyperglycemia, unspecified: Secondary | ICD-10-CM

## 2022-05-15 DIAGNOSIS — Z8632 Personal history of gestational diabetes: Secondary | ICD-10-CM | POA: Diagnosis not present

## 2022-05-15 DIAGNOSIS — Z0001 Encounter for general adult medical examination with abnormal findings: Secondary | ICD-10-CM

## 2022-05-15 DIAGNOSIS — Z136 Encounter for screening for cardiovascular disorders: Secondary | ICD-10-CM

## 2022-05-15 DIAGNOSIS — O903 Peripartum cardiomyopathy: Secondary | ICD-10-CM | POA: Insufficient documentation

## 2022-05-15 LAB — LIPID PANEL
Cholesterol: 228 mg/dL — ABNORMAL HIGH (ref 0–200)
HDL: 63.2 mg/dL (ref >39.00)
LDL Cholesterol: 150 mg/dL — ABNORMAL HIGH (ref 0–99)
NonHDL: 165.11
Total CHOL/HDL Ratio: 4
Triglycerides: 77 mg/dL (ref 0.0–149.0)
VLDL: 15.4 mg/dL (ref 0.0–40.0)

## 2022-05-15 LAB — CBC
HCT: 39.9 % (ref 36.0–46.0)
Hemoglobin: 12.9 g/dL (ref 12.0–15.0)
MCHC: 32.3 g/dL (ref 30.0–36.0)
MCV: 78.7 fl (ref 78.0–100.0)
Platelets: 269 10*3/uL (ref 150.0–400.0)
RBC: 5.06 Mil/uL (ref 3.87–5.11)
RDW: 13.7 % (ref 11.5–15.5)
WBC: 6.9 10*3/uL (ref 4.0–10.5)

## 2022-05-15 LAB — COMPREHENSIVE METABOLIC PANEL
ALT: 16 U/L (ref 0–35)
AST: 15 U/L (ref 0–37)
Albumin: 4.2 g/dL (ref 3.5–5.2)
Alkaline Phosphatase: 56 U/L (ref 39–117)
BUN: 10 mg/dL (ref 6–23)
CO2: 27 mEq/L (ref 19–32)
Calcium: 9.4 mg/dL (ref 8.4–10.5)
Chloride: 102 mEq/L (ref 96–112)
Creatinine, Ser: 0.97 mg/dL (ref 0.40–1.20)
GFR: 74.87 mL/min (ref >60.00)
Glucose, Bld: 98 mg/dL (ref 70–99)
Potassium: 3.6 mEq/L (ref 3.5–5.1)
Sodium: 136 mEq/L (ref 135–145)
Total Bilirubin: 0.3 mg/dL (ref 0.2–1.2)
Total Protein: 7.2 g/dL (ref 6.0–8.3)

## 2022-05-15 LAB — HEMOGLOBIN A1C: Hgb A1c MFr Bld: 5.6 % (ref 4.6–6.5)

## 2022-05-15 NOTE — Progress Notes (Signed)
Complete physical exam  Patient: [name]   DOB: Jul 02, 1986   36 y.o. Female  MRN: [id number] Visit Date: 05/15/2022  Subjective:    Chief Complaint  Patient presents with   Annual Exam    CPE Pt fasting Fibroids  No other concerns    [name] is a 36 y.o. female who presents today for a complete physical exam. She reports consuming a low fat diet. Gym/ health club routine includes cardio and light weights. She generally feels well. She reports sleeping well. She does not have additional problems to discuss today.  Vision:Yes Dental:Yes STD Screen:No  Wt Readings from Last 3 Encounters:  05/15/22 236 lb 6.4 oz (107.2 kg)  02/12/22 240 lb 12.8 oz (109.2 kg)  04/15/20 (!) 224 lb 6.4 oz (101.8 kg)    Most recent fall risk assessment:    05/15/2022    8:18 AM  [place] in the past year? 0  Number falls in past yr: 0  Injury with Fall? 0   Most recent depression screenings:    05/15/2022    8:40 AM 04/04/2022    9:41 AM  PHQ 2/9 Scores  PHQ - 2 Score 2   PHQ- 9 Score 6      Information is confidential and restricted. Go to Review Flowsheets to unlock data.    HPI  No problem-specific Assessment & Plan notes found for this encounter.  Past Medical History:  Diagnosis Date   ADHD (attention deficit hyperactivity disorder)    Chicken pox    Headaches    Hx of migraines    Hx of varicella    Past Surgical History:  Procedure Laterality Date   CESAREAN SECTION  2003 & 2006   CESAREAN SECTION N/A 04/30/2014   Procedure: REPEAT CESAREAN SECTION;  Surgeon: Allyn Kenner, DO;  Location: [facility];  Service: Obstetrics;  Laterality: N/A;   CESAREAN SECTION N/A 05/15/2016   Procedure: CESAREAN SECTION;  Surgeon: Allyn Kenner, DO;  Location: [facility];  Service: Obstetrics;  Laterality: N/A;   CHOLECYSTECTOMY     TUBAL LIGATION Bilateral 05/15/2016   Procedure: BILATERAL TUBAL LIGATION;  Surgeon: Allyn Kenner, DO;  Location: [facility];   Service: Obstetrics;  Laterality: Bilateral;   Social History   Socioeconomic History   Marital status: Married    Spouse name: Not on file   Number of children: 4   Years of education: college   Highest education level: Associate degree: academic program  Occupational History   Not on file  Tobacco Use   Smoking status: Never   Smokeless tobacco: Never  Vaping Use   Vaping Use: Never used  Substance and Sexual Activity   Alcohol use: No   Drug use: No   Sexual activity: Yes    Birth control/protection: None  Other Topics Concern   Not on file  Social History Narrative      Right-handed.   Drinks four cups caffeine daily.      Social Hx:   Current living situation- lives in [place] with her husband and 4 kids.    Born and raised in [place] by grandparents until she was 52yo. She was very happy there. Then her mom moved her and her siblings in with her after that. Her mom was working and they were home alone a lot after that. Dad was not involved.    Siblings- 3. Pt is the oldest. Her sister is 48 months younger than here. Her brother  is 6 years apart and her youngest sister is about 8 years apart.   Schooling- graduated HS and has an associates degree in Banker from [employer]. She is currently attending [employer] and is working on [employer] in Runner, broadcasting/film/video.    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    Married- since 2013, only marriage   Kids - 4 (3 girls and 1 boy)   Scientist, research (physical sciences) issues   Social Determinants of Radio broadcast assistant Strain: Not on file  Food Insecurity: Not on file  Transportation Needs: Not on file  Physical Activity: Not on file  Stress: Not on file  Social Connections: Not on file  Intimate Partner Violence: Not on file   Family Status  Relation Name Status   Mother  Alive   Father  Alive   Other  (Not Specified)   Mat Aunt  (Not Specified)   Field seismologist  (Not Specified)   MGM  (Not  Specified)   PGF  (Not Specified)   MGF  (Not Specified)   Family History  Problem Relation Age of Onset   Diabetes Mother    Hypertension Mother    Alcohol abuse Mother    Hyperlipidemia Mother    Cancer Mother 3       breast cancer   Hyperlipidemia Other    Obesity Other    Diabetes Maternal Aunt    Hypertension Maternal Aunt    Kidney disease Maternal Aunt    Diabetes Paternal Aunt    Hypertension Paternal Aunt    Diabetes Maternal Grandmother    Heart disease Maternal Grandmother    Diabetes Paternal Grandfather    Hypertension Maternal Grandfather    No Known Allergies  Patient Care Team: Johnie Stadel, Charlene Brooke, NP as PCP - General (Internal Medicine)   Medications: Outpatient Medications Prior to Visit  Medication Sig   ibuprofen (ADVIL) 800 MG tablet Take 1 tablet (800 mg total) by mouth 3 (three) times daily.   lisdexamfetamine (VYVANSE) 30 MG capsule Take 1 capsule (30 mg total) by mouth daily.   [DISCONTINUED] methylphenidate 54 MG PO CR tablet Take 1 tablet (54 mg total) by mouth every morning. (Patient not taking: Reported on 05/15/2022)   No facility-administered medications prior to visit.    Review of Systems      Objective:  BP 130/80 (BP Location: Right Arm, Patient Position: Sitting, Cuff Size: Normal)   Pulse 84   Temp (!) 96.4 F (35.8 C) (Temporal)   Ht '5\' 6"'$  (1.676 m)   Wt 236 lb 6.4 oz (107.2 kg)   SpO2 99%   BMI 38.16 kg/m       Physical Exam Vitals reviewed.  Constitutional:      General: She is not in acute distress.    Appearance: She is well-developed. She is obese.  HENT:     Right Ear: Tympanic membrane, ear canal and external ear normal.     Left Ear: Tympanic membrane, ear canal and external ear normal.     Nose: Nose normal.  Eyes:     Extraocular Movements: Extraocular movements intact.     Conjunctiva/sclera: Conjunctivae normal.  Cardiovascular:     Rate and Rhythm: Normal rate and regular rhythm.     Pulses:  Normal pulses.     Heart sounds: Normal heart sounds.  Pulmonary:     Effort: Pulmonary effort is normal. No respiratory distress.     Breath sounds:  Normal breath sounds.  Chest:     Chest wall: No tenderness.  Abdominal:     General: Bowel sounds are normal.     Palpations: Abdomen is soft.  Musculoskeletal:        General: Normal range of motion.     Right lower leg: No edema.     Left lower leg: No edema.  Skin:    General: Skin is warm and dry.  Neurological:     Mental Status: She is alert and oriented to person, place, and time.     Deep Tendon Reflexes: Reflexes are normal and symmetric.  Psychiatric:        Mood and Affect: Mood normal.        Behavior: Behavior normal.        Thought Content: Thought content normal.     No results found for any visits on 05/15/22.    Assessment & Plan:    Routine Health Maintenance and Physical Exam  Immunization History  Administered Date(s) Administered   Influenza,inj,Quad PF,6+ Mos 06/02/2019   Janssen (J&J) SARS-COV-2 Vaccination 12/08/2019   PFIZER Comirnaty(Gray Top)Covid-19 Tri-Sucrose Vaccine 11/30/2020, 12/21/2020   Tdap 05/01/2014, 02/19/2016    Health Maintenance  Topic Date Due   INFLUENZA VACCINE  04/21/2022   COVID-19 Vaccine (4 - Booster for Janssen series) 05/31/2022 (Originally 02/15/2021)   Hepatitis C Screening  05/16/2023 (Originally 04/10/2003)   HEMOGLOBIN A1C  08/15/2022   PAP SMEAR-Modifier  11/20/2023   TETANUS/TDAP  02/18/2026   HIV Screening  Completed   HPV VACCINES  Aged Out   FOOT EXAM  Discontinued   OPHTHALMOLOGY EXAM  Discontinued   URINE MICROALBUMIN  Discontinued   Discussed health benefits of physical activity, and encouraged her to engage in regular exercise appropriate for her age and condition.  Problem List Items Addressed This Visit       Other   History of gestational diabetes mellitus   Relevant Orders   Hemoglobin A1c   Other Visit Diagnoses     Encounter for  preventative adult health care exam with abnormal findings    -  Primary   Relevant Orders   CBC   Comprehensive metabolic panel   Lipid panel   Hyperglycemia       Relevant Orders   Hemoglobin A1c   Encounter for lipid screening for cardiovascular disease       Relevant Orders   Lipid panel      Return in about 1 year (around 05/16/2023) for CPE (fasting).     Wilfred Lacy, NP

## 2022-05-15 NOTE — Patient Instructions (Signed)
Go to lab  Preventive Care 37-37 Years Old, Female Preventive care refers to lifestyle choices and visits with your health care provider that can promote health and wellness. Preventive care visits are also called wellness exams. What can I expect for my preventive care visit? Counseling During your preventive care visit, your health care provider may ask about your: Medical history, including: Past medical problems. Family medical history. Pregnancy history. Current health, including: Menstrual cycle. Method of birth control. Emotional well-being. Home life and relationship well-being. Sexual activity and sexual health. Lifestyle, including: Alcohol, nicotine or tobacco, and drug use. Access to firearms. Diet, exercise, and sleep habits. Work and work Statistician. Sunscreen use. Safety issues such as seatbelt and bike helmet use. Physical exam Your health care provider may check your: Height and weight. These may be used to calculate your BMI (body mass index). BMI is a measurement that tells if you are at a healthy weight. Waist circumference. This measures the distance around your waistline. This measurement also tells if you are at a healthy weight and may help predict your risk of certain diseases, such as type 2 diabetes and high blood pressure. Heart rate and blood pressure. Body temperature. Skin for abnormal spots. What immunizations do I need?  Vaccines are usually given at various ages, according to a schedule. Your health care provider will recommend vaccines for you based on your age, medical history, and lifestyle or other factors, such as travel or where you work. What tests do I need? Screening Your health care provider may recommend screening tests for certain conditions. This may include: Pelvic exam and Pap test. Lipid and cholesterol levels. Diabetes screening. This is done by checking your blood sugar (glucose) after you have not eaten for a while  (fasting). Hepatitis B test. Hepatitis C test. HIV (human immunodeficiency virus) test. STI (sexually transmitted infection) testing, if you are at risk. BRCA-related cancer screening. This may be done if you have a family history of breast, ovarian, tubal, or peritoneal cancers. Talk with your health care provider about your test results, treatment options, and if necessary, the need for more tests. Follow these instructions at home: Eating and drinking  Eat a healthy diet that includes fresh fruits and vegetables, whole grains, lean protein, and low-fat dairy products. Take vitamin and mineral supplements as recommended by your health care provider. Do not drink alcohol if: Your health care provider tells you not to drink. You are pregnant, may be pregnant, or are planning to become pregnant. If you drink alcohol: Limit how much you have to 0-1 drink a day. Know how much alcohol is in your drink. In the [place] one drink equals one 12 oz bottle of beer (355 mL), one 5 oz glass of wine (148 mL), or one 1 oz glass of hard liquor (44 mL). Lifestyle Brush your teeth every morning and night with fluoride toothpaste. Floss one time each day. Exercise for at least 30 minutes 5 or more days each week. Do not use any products that contain nicotine or tobacco. These products include cigarettes, chewing tobacco, and vaping devices, such as e-cigarettes. If you need help quitting, ask your health care provider. Do not use drugs. If you are sexually active, practice safe sex. Use a condom or other form of protection to prevent STIs. If you do not wish to become pregnant, use a form of birth control. If you plan to become pregnant, see your health care provider for a prepregnancy visit. Find healthy ways to manage  stress, such as: Meditation, yoga, or listening to music. Journaling. Talking to a trusted person. Spending time with friends and family. Minimize exposure to UV radiation to reduce your  risk of skin cancer. Safety Always wear your seat belt while driving or riding in a vehicle. Do not drive: If you have been drinking alcohol. Do not ride with someone who has been drinking. If you have been using any mind-altering substances or drugs. While texting. When you are tired or distracted. Wear a helmet and other protective equipment during sports activities. If you have firearms in your house, make sure you follow all gun safety procedures. Seek help if you have been physically or sexually abused. What's next? Go to your health care provider once a year for an annual wellness visit. Ask your health care provider how often you should have your eyes and teeth checked. Stay up to date on all vaccines. This information is not intended to replace advice given to you by your health care provider. Make sure you discuss any questions you have with your health care provider. Document Revised: 03/05/2021 Document Reviewed: 03/05/2021 Elsevier Patient Education  [place].

## 2022-05-21 ENCOUNTER — Telehealth (HOSPITAL_COMMUNITY): Payer: Self-pay | Admitting: *Deleted

## 2022-05-21 ENCOUNTER — Ambulatory Visit (INDEPENDENT_AMBULATORY_CARE_PROVIDER_SITE_OTHER): Payer: BC Managed Care – PPO | Admitting: Psychology

## 2022-05-21 DIAGNOSIS — F411 Generalized anxiety disorder: Secondary | ICD-10-CM

## 2022-05-21 DIAGNOSIS — F900 Attention-deficit hyperactivity disorder, predominantly inattentive type: Secondary | ICD-10-CM | POA: Diagnosis not present

## 2022-05-21 DIAGNOSIS — F339 Major depressive disorder, recurrent, unspecified: Secondary | ICD-10-CM

## 2022-05-21 NOTE — Progress Notes (Signed)
[facility] Counselor/Therapist Progress Note  Patient ID: [name], MRN: [id number]   Date: 05/21/22  Time Spent: 11:05 am - 11:46  am : 41 Minutes  Treatment Type: Individual Therapy.  Reported Symptoms: Depression and anxiety.  Mental Status Exam: Appearance:  Neat and Well Groomed     Behavior: Appropriate  Motor: Normal  Speech/Language:  Normal Rate  Affect: Appropriate  Mood: anxious  Thought process: normal  Thought content:   WNL  Sensory/Perceptual disturbances:   WNL  Orientation: oriented to person, place, time/date, and situation  Attention: Good  Concentration: Good  Memory: WNL  Fund of knowledge:  Good  Insight:   Good  Judgment:  Good  Impulse Control: Good   Risk Assessment: Danger to Self:  No Self-injurious Behavior: No Danger to Others: No Duty to Warn:no Physical Aggression / Violence:No  Access to Firearms a concern: No  Gang Involvement:No   In case of a mental health emergency:  23 - confidential suicide hotline. [facility]):        [place], [place] [address]       [phone] 3.   911  4.   Visiting Nearest ED.    Subjective:   [name] participated from home, via video and consented to treatment. Therapist participated from office. We met online due to  pandemic. Tylisha reviewed the events of the past week. Shelbey noted feeling anxious due to barriers in completing her course. She noted needing a medication refill for her stimulant and this being a barrier to productivity, as well. Therapist praised Rainee for her effort to communicate her needs more consistently with her husband and managing the discomfort she feels during this time. Katye noted financial pressures and her efforts to contribute.We worked on identifying additional ways, aside of financially, that she contributes during the session. We engaged in problem-solving to identifying possible ways  to address financial pressures.   Cobi denied any suicidal thoughts  and noted managing her depression more proactively. Therapist validated Alaila's feelings and provided supportive therapy. A follow-up was scheduled. Tishawna continues to benefit from therapy.   Interventions: CBT.   Diagnosis:  ADHD, predominantly inattentive type  Depression, recurrent ([place])  GAD (generalized anxiety disorder)   Treatment Plan:  Client Abilities/Strengths Lakeitha is intelligent, forthcoming, and motivated for change.   Support System: Family, husband.   Client Treatment Preferences Outpatient Therapy.   Client Statement of Needs Loma would like to decrease rumination, think more positively, maintain positive behaviors (exercise), and manage overall mood.    Treatment Level Weekly  Symptoms  Depression: Low self-esteem, negative self-talk, negative automatic thoughts, infrequent self-care, feelings of worthlessness, sadness.    (Status: declined) Anxiety: Consistent worry, difficulty managing worry, tension, irritation  (Status: maintained)  Goals:   Hermine experiences symptoms of depression, anxiety, and ADHD.    Target Date: 10/31/22 Frequency: Weekly  Progress: 0 Modality: individual    Therapist will provide referrals for additional resources as appropriate.  Therapist will provide psycho-education regarding Delberta's diagnosis and corresponding treatment approaches and interventions. Licensed Clinical Social Worker, [place], LCSW will support the patient's ability to achieve the goals identified. will employ CBT, BA, Problem-solving, Solution Focused, Mindfulness,  coping skills, & other evidenced-based practices will be used to promote progress towards healthy functioning to help manage decrease symptoms associated with her diagnosis.   Reduce overall level, frequency, and intensity of the feelings of depression and anxiety evidenced by decreased overall symptoms  including  rumination, negative self-talk, negative automatic thoughts, depressive moods/symptoms from 6 to 7 days/week to 0 to 1 days/week per client report for at least 3 consecutive months. Verbally express understanding of the relationship between feelings of depression, anxiety and their impact on thinking patterns and behaviors. Verbalize an understanding of the role that distorted thinking plays in creating fears, excessive worry, and ruminations.    [name] participated in the creation of the treatment plan)   Buena Irish, LCSW

## 2022-05-21 NOTE — Telephone Encounter (Signed)
Pt called requesting a refill of Vyvanse 30 mg, last written 04/04/22. Pt has an upcoming appointment on 06/25/22. Pt would likr Rx sent to [facility] on [address]. Please review.

## 2022-05-28 ENCOUNTER — Other Ambulatory Visit (HOSPITAL_COMMUNITY): Payer: Self-pay | Admitting: Psychiatry

## 2022-05-28 DIAGNOSIS — F900 Attention-deficit hyperactivity disorder, predominantly inattentive type: Secondary | ICD-10-CM

## 2022-05-28 MED ORDER — LISDEXAMFETAMINE DIMESYLATE 30 MG PO CAPS: 30.0000 mg | ORAL_CAPSULE | Freq: Every day | ORAL | 0 refills | Status: DC

## 2022-06-09 ENCOUNTER — Ambulatory Visit (INDEPENDENT_AMBULATORY_CARE_PROVIDER_SITE_OTHER): Payer: BC Managed Care – PPO | Admitting: Psychology

## 2022-06-09 DIAGNOSIS — F339 Major depressive disorder, recurrent, unspecified: Secondary | ICD-10-CM | POA: Diagnosis not present

## 2022-06-09 DIAGNOSIS — F900 Attention-deficit hyperactivity disorder, predominantly inattentive type: Secondary | ICD-10-CM

## 2022-06-09 DIAGNOSIS — F411 Generalized anxiety disorder: Secondary | ICD-10-CM

## 2022-06-09 NOTE — Progress Notes (Signed)
[facility] Counselor/Therapist Progress Note  Patient ID: [name], MRN: [id number]   Date: 06/09/22  Time Spent: 10:04 am - 10:59  am : 55 Minutes  Treatment Type: Individual Therapy.  Reported Symptoms: Depression and anxiety.  Mental Status Exam: Appearance:  Neat and Well Groomed     Behavior: Appropriate  Motor: Normal  Speech/Language:  Normal Rate  Affect: Appropriate  Mood: dysthymic  Thought process: normal  Thought content:   WNL  Sensory/Perceptual disturbances:   WNL  Orientation: oriented to person, place, time/date, and situation  Attention: Good  Concentration: Good  Memory: WNL  Fund of knowledge:  Good  Insight:   Good  Judgment:  Good  Impulse Control: Good   Risk Assessment: Danger to Self:  No Self-injurious Behavior: No Danger to Others: No Duty to Warn:no Physical Aggression / Violence:No  Access to Firearms a concern: No  Gang Involvement:No   In case of a mental health emergency:  28 - confidential suicide hotline. [facility]):        [place], [place] [address]       [phone] 3.   911  4.   Visiting Nearest ED.    Subjective:   [name] participated from home, via video and consented to treatment. Therapist participated from office. We met online due to [place] pandemic. [name] reviewed the events of the past week. She noted educational stressors due to completing tasks for an assignment. She noted discomfort with completing a video assignment and noted this being due to not "not liking how I look". We explored this during the session. We delineated her concerns and therapist highlighted cognitive distortions and worked on challenging this during the session. We discussed adaptive ways to think of and manage symptoms. We discussed the importance of managing self-talk and engaging in positive self-talk and highlighting strengths. Therapist encouraged [name] to  practice this daily. Therapist modeled this during the session. [name] was engaged and motivated in treatment. She committed to be more mindful of self-talk and to challenged this going forward. Therapist praised [name] for her effort between sessions. Therapist provided supportive therapy.   Interventions: CBT.   Diagnosis:  ADHD, predominantly inattentive type  Depression, recurrent ([place])  GAD (generalized anxiety disorder)   Treatment Plan:  Client Abilities/Strengths [name] is intelligent, forthcoming, and motivated for change.   Support System: Family, husband.   Client Treatment Preferences Outpatient Therapy.   Client Statement of Needs [name] would like to decrease rumination, think more positively, maintain positive behaviors (exercise), and manage overall mood.    Treatment Level Weekly  Symptoms  Depression: Low self-esteem, negative self-talk, negative automatic thoughts, infrequent self-care, feelings of worthlessness, sadness.    (Status: declined) Anxiety: Consistent worry, difficulty managing worry, tension, irritation  (Status: maintained)  Goals:   [name] experiences symptoms of depression, anxiety, and ADHD.    Target Date: 10/31/22 Frequency: Weekly  Progress: 0 Modality: individual    Therapist will provide referrals for additional resources as appropriate.  Therapist will provide psycho-education regarding [name]'s diagnosis and corresponding treatment approaches and interventions. Licensed Clinical Social Worker, [place], LCSW will support the patient's ability to achieve the goals identified. will employ CBT, BA, Problem-solving, Solution Focused, Mindfulness,  coping skills, & other evidenced-based practices will be used to promote progress towards healthy functioning to help manage decrease symptoms associated with her diagnosis.   Reduce overall level, frequency, and intensity of the feelings of depression and anxiety evidenced  by  decreased overall symptoms including rumination, negative self-talk, negative automatic thoughts, depressive moods/symptoms from 6 to 7 days/week to 0 to 1 days/week per client report for at least 3 consecutive months. Verbally express understanding of the relationship between feelings of depression, anxiety and their impact on thinking patterns and behaviors. Verbalize an understanding of the role that distorted thinking plays in creating fears, excessive worry, and ruminations.    [name] participated in the creation of the treatment plan)   Mary Irish, LCSW

## 2022-06-25 ENCOUNTER — Ambulatory Visit (HOSPITAL_BASED_OUTPATIENT_CLINIC_OR_DEPARTMENT_OTHER): Payer: BC Managed Care – PPO | Admitting: Psychiatry

## 2022-06-25 DIAGNOSIS — F900 Attention-deficit hyperactivity disorder, predominantly inattentive type: Secondary | ICD-10-CM

## 2022-06-25 DIAGNOSIS — F321 Major depressive disorder, single episode, moderate: Secondary | ICD-10-CM

## 2022-06-25 MED ORDER — AMPHETAMINE-DEXTROAMPHET ER 30 MG PO CP24: 30.0000 mg | ORAL_CAPSULE | Freq: Every day | ORAL | 0 refills | Status: DC

## 2022-06-25 NOTE — Progress Notes (Signed)
BH MD/PA/NP OP Progress Note  06/25/2022 9:48 AM [name]  MRN:  [id number]  Chief Complaint:  Chief Complaint  Patient presents with   ADHD   Follow-up   HPI: "Alright I guess".  [name] shares that she gets depressed about 1 week before her menstrual cycle starts. She has overwhelming negative thoughts about herself. This makes her mood go down. [name] was advised by her therapist to talk to her husband and open up more about what she is feeling. 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  [name] knows she is contributing to the household in other ways but during this time she can only focus on the negative self thoughts. During the worst part of the depression she has passive thoughts of death and thinks it wouldn't be bad if she died. She denies active SI with plan or intent. The thoughts last for a day then resolve. The last time was 2 weeks ago. She denies HI. The last time she felt depressed was about 2 weeks ago. The episodes usually last 3-4 days then resolve. The rest of the month she has mild on/off depressed mood and negative self thoughts but she is so busy she doesn't focus on it. She is working on it in therapy. Her sleep is ok most nights. Her appetite is poor and she can go the entire day without eating until she feels faint. She often forgets to eat. [name] is stressed by her classes. [name] states the Vyvanse is not as effective as Concerta. She has trouble focusing in class, trouble finishing assignments and staying focused to study, staying focus to complete tasks and most often jumps from activity to activity without finishing most of them and attending appointments. She keeps post it notes, alarms and her husband reminds her of things to get things done. She takes Vyvanse between 8-9am and it wears off around 3pm. She notices that she gets more irritable around that time.    Visit Diagnosis:    ICD-10-CM   1. ADHD,  predominantly inattentive type  F90.0 amphetamine-dextroamphetamine (ADDERALL XR) 30 MG 24 hr capsule    2. MDD (major depressive disorder), single episode, moderate ([facility])  F32.1       Past Psychiatric History: no updates per patient. See H&P dated 04/04/22 for details  Past Medical History:  Past Medical History:  Diagnosis Date   ADHD (attention deficit hyperactivity disorder)    Chicken pox    Headaches    Hx of migraines    Hx of varicella     Past Surgical History:  Procedure Laterality Date   CESAREAN SECTION  2003 & 2006   CESAREAN SECTION N/A 04/30/2014   Procedure: REPEAT CESAREAN SECTION;  Surgeon: Allyn Kenner, DO;  Location: [facility];  Service: Obstetrics;  Laterality: N/A;   CESAREAN SECTION N/A 05/15/2016   Procedure: CESAREAN SECTION;  Surgeon: Allyn Kenner, DO;  Location: [facility];  Service: Obstetrics;  Laterality: N/A;   CHOLECYSTECTOMY     TUBAL LIGATION Bilateral 05/15/2016   Procedure: BILATERAL TUBAL LIGATION;  Surgeon: Allyn Kenner, DO;  Location: [facility];  Service: Obstetrics;  Laterality: Bilateral;    Family Psychiatric  and Medical History:  Family History  Problem Relation Age of Onset   Diabetes Mother    Hypertension Mother    Alcohol abuse Mother    Hyperlipidemia Mother    Cancer Mother 41  breast cancer   Hyperlipidemia Other    Obesity Other    Diabetes Maternal Aunt    Hypertension Maternal Aunt    Kidney disease Maternal Aunt    Diabetes Paternal Aunt    Hypertension Paternal Aunt    Diabetes Maternal Grandmother    Heart disease Maternal Grandmother    Diabetes Paternal Grandfather    Hypertension Maternal Grandfather     Social History:  Social History   Socioeconomic History   Marital status: Married    Spouse name: Not on file   Number of children: 4   Years of education: college   Highest education level: Associate degree: academic program  Occupational History   Not on file  Tobacco  Use   Smoking status: Never   Smokeless tobacco: Never  Vaping Use   Vaping Use: Never used  Substance and Sexual Activity   Alcohol use: No   Drug use: No   Sexual activity: Yes    Birth control/protection: None  Other Topics Concern   Not on file  Social History Narrative      Right-handed.   Drinks four cups caffeine daily.      Social Hx:   Current living situation- lives in [place] with her husband and 4 kids.    Born and raised in [place] by grandparents until she was 24yo. She was very happy there. Then her mom moved her and her siblings in with her after that. Her mom was working and they were home alone a lot after that. Dad was not involved.    Siblings- 3. Pt is the oldest. Her sister is 70 months younger than here. Her brother is 6 years apart and her youngest sister is about 8 years apart.   Schooling- graduated HS and has an associates degree in Banker from [employer]. She is currently attending [employer] and is working on [employer] in Runner, broadcasting/film/video.    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    Married- since 2013, only marriage   Kids - 4 (3 girls and 1 boy)   Scientist, research (physical sciences) issues   Social Determinants of Health   Financial Resource Strain: Not on file  Food Insecurity: Not on file  Transportation Needs: Not on file  Physical Activity: Not on file  Stress: Not on file  Social Connections: Not on file    Allergies: No Known Allergies  Metabolic Disorder Labs: Lab Results  Component Value Date   HGBA1C 5.6 05/15/2022   No results found for: "PROLACTIN" Lab Results  Component Value Date   CHOL 228 (H) 05/15/2022   TRIG 77.0 05/15/2022   HDL 63.20 05/15/2022   CHOLHDL 4 05/15/2022   VLDL 15.4 05/15/2022   LDLCALC 150 (H) 05/15/2022   LDLCALC 167 (H) 01/29/2020   Lab Results  Component Value Date   TSH 2.56 01/29/2020   TSH 3.28 05/18/2019    Therapeutic Level Labs: No results found for:  "LITHIUM" No results found for: "VALPROATE" No results found for: "CBMZ"  Current Medications: Current Outpatient Medications  Medication Sig Dispense Refill   amphetamine-dextroamphetamine (ADDERALL XR) 30 MG 24 hr capsule Take 1 capsule (30 mg total) by mouth daily. 30 capsule 0   ibuprofen (ADVIL) 800 MG tablet Take 1 tablet (800 mg total) by mouth 3 (three) times daily. 30 tablet 0   No current facility-administered medications for this visit.     Musculoskeletal: Strength & Muscle Tone: within normal limits  Gait & Station: normal Patient leans:  straight  Psychiatric Specialty Exam: Review of Systems  unknown if currently breastfeeding.There is no height or weight on file to calculate BMI.  General Appearance: Casual and Fairly Groomed  Eye Contact:  Good  Speech:  Clear and Coherent and Normal Rate  Volume:  Decreased  Mood:  Depressed  Affect:  Full Range  Thought Process:  Goal Directed, Linear, and Descriptions of Associations: Intact  Orientation:  Full (Time, Place, and Person)  Thought Content: Logical   Suicidal Thoughts:  No  Homicidal Thoughts:  No  Memory:  Immediate;   Good  Judgement:  Good  Insight:  Good  Psychomotor Activity:  Normal  Concentration:  Concentration: Good  Recall:  Good  Fund of Knowledge: Good  Language: Good  Akathisia:  No  Handed:  Right  AIMS (if indicated): not done  Assets:  Communication Skills Desire for Improvement Financial Resources/Insurance Housing Intimacy Leisure Time Resilience Social Support Talents/Skills Transportation Vocational/Educational  ADL's:  Intact  Cognition: WNL  Sleep:  Good   Screenings: GAD-7    Flowsheet Row Office Visit from 05/15/2022 in [facility] Visit from 02/12/2022 in [facility] Video Visit from 02/16/2020 in [facility] Visit from 01/29/2020 in [facility] Video Visit from  10/26/2019 in [facility]  Total GAD-7 Score '8 6 11 6 4      '$ PHQ2-9    [facility] Office Visit from 06/25/2022 in [facility] Visit from 05/15/2022 in [facility] Office Visit from 04/04/2022 in [facility] Office Visit from 02/12/2022 in [facility] Video Visit from 02/16/2020 in [facility]  PHQ-2 Total Score '2 2 2 3 3  '$ PHQ-9 Total Score '18 6 12 13 12      '$ [facility] Office Visit from 04/04/2022 in [facility] from 11/10/2020 in [facility] from 11/09/2020 in [facility] Error: Q3, 4, or 5 should not be populated when Q2 is No No Risk No Risk        Assessment and Plan:     Medication management with supportive therapy. Risks and benefits, side effects and alternative treatment options discussed with patient. Pt was given an opportunity to ask questions about medication, illness, and treatment. All current psychiatric medications have been reviewed and discussed with the patient and adjusted as clinically appropriate. The patient has been provided an accurate and updated list of the medications being now prescribed. Pt verbalized understanding and verbal consent obtained for treatment.   Pt is aware that these meds carry a teratogenic risk. Pt will discuss plan of action if she does or plans to become pregnant in the future.  Status of current problems: ongoing ADHD symptoms.   She is working on depression symptoms with her therapist and does not want any medications for it.  Meds: d/c Vyvanse Start trial of Adderall XR '30mg'$  po qD for ADHD   Labs: none  Therapy: brief supportive therapy provided. Discussed psychosocial stressors in detail.    F/up in 1-2 weeks or sooner  if needed  The duration of this appointment visit was 20 minutes of face-to-face time with the patient.  Greater than 50% of this time was spent in counseling, explanation of  diagnosis, planning of further management, and coordination of care  Collaboration of Care: Collaboration of Care: Referral or follow-up with counselor/therapist AEB therapist  Patient/Guardian was advised Release of Information must be obtained prior to any record release in order to collaborate their care with an outside provider. Patient/Guardian was advised if they have not already done so to contact the registration department to sign all necessary forms in order for [place] to release information regarding their care.   Consent: Patient/Guardian gives verbal consent for treatment and assignment of benefits for services provided during this visit. Patient/Guardian expressed understanding and agreed to proceed.    Charlcie Cradle, MD 06/25/2022, 9:48 AM

## 2022-06-26 ENCOUNTER — Ambulatory Visit (INDEPENDENT_AMBULATORY_CARE_PROVIDER_SITE_OTHER): Payer: BC Managed Care – PPO | Admitting: Psychology

## 2022-06-26 DIAGNOSIS — F900 Attention-deficit hyperactivity disorder, predominantly inattentive type: Secondary | ICD-10-CM | POA: Diagnosis not present

## 2022-06-26 DIAGNOSIS — F321 Major depressive disorder, single episode, moderate: Secondary | ICD-10-CM | POA: Diagnosis not present

## 2022-06-26 DIAGNOSIS — F339 Major depressive disorder, recurrent, unspecified: Secondary | ICD-10-CM | POA: Diagnosis not present

## 2022-06-26 NOTE — Progress Notes (Signed)
[facility] Counselor/Therapist Progress Note  Patient ID: [name], MRN: [id number]   Date: 06/26/22  Time Spent: 10:02 am - 10:59  am : 57 Minutes  Treatment Type: Individual Therapy.  Reported Symptoms: Depression and anxiety.  Mental Status Exam: Appearance:  Neat and Well Groomed     Behavior: Appropriate  Motor: Normal  Speech/Language:  Normal Rate  Affect: Appropriate  Mood: dysthymic  Thought process: normal  Thought content:   WNL  Sensory/Perceptual disturbances:   WNL  Orientation: oriented to person, place, time/date, and situation  Attention: Good  Concentration: Good  Memory: WNL  Fund of knowledge:  Good  Insight:   Good  Judgment:  Good  Impulse Control: Good   Risk Assessment: Danger to Self:  No Self-injurious Behavior: No Danger to Others: No Duty to Warn:no Physical Aggression / Violence:No  Access to Firearms a concern: No  Gang Involvement:No   In case of a mental health emergency:  48 - confidential suicide hotline. [facility]):        [place], [place] [address]       [phone] 3.   911  4.   Visiting Nearest ED.    Subjective:   [name] participated from home, via video and consented to treatment. Therapist participated from home office. We met online due to [place] pandemic. Copeland reviewed the events of the past week. She noted being switched to Adderall $RemoveBef'30mg'xFwzfcCvEk$  qd and noted taking her first dose today. She is slated to see her provider in two weeks. She noted "Stressing out" regarding the past assignment but completing the it. She noted have to redo a portion of it but noted this going well, overall. She noted it being "not very bad" in hindsight. She noted "not recognizing who I am?" We explored this during the session. "I am here and what am I really doing?" She noted feeling stuck and wondering what her long-term plan was. We identified negative self talk  including "you should be better than this". She noted wavering on continuing to purse her degree. She noted politics affecting her outlook specifically the uncertainty of what life can look like. We worked on challenging "not doing much" during the session and identified the delay of her initial timeline for completing her degree having a negative effect on her mood. We explored this during the session. Therapist encouraged Aahana to identify areas of progress and to work on creating a new timeline to be processed during our follow-up. Therapist praised Judithann for her effort in sessions. Therapist provided supportive therapy.   Interventions: CBT.   Diagnosis:  ADHD, predominantly inattentive type  MDD (major depressive disorder), single episode, moderate ([facility])  Depression, recurrent ([facility])   Treatment Plan:  Client Abilities/Strengths Ilianna is intelligent, forthcoming, and motivated for change.   Support System: Family, husband.   Client Treatment Preferences Outpatient Therapy.   Client Statement of Needs Lorrain would like to decrease rumination, think more positively, maintain positive behaviors (exercise), and manage overall mood.    Treatment Level Weekly  Symptoms  Depression: Low self-esteem, negative self-talk, negative automatic thoughts, infrequent self-care, feelings of worthlessness, sadness.    (Status: declined) Anxiety: Consistent worry, difficulty managing worry, tension, irritation  (Status: maintained)  Goals:   Destynie experiences symptoms of depression, anxiety, and ADHD.    Target Date: 10/31/22 Frequency: Weekly  Progress: 0 Modality: individual    Therapist will provide referrals for additional resources as appropriate.  Therapist will provide psycho-education regarding Gessica's diagnosis and corresponding treatment approaches and interventions. Licensed Clinical Social Worker, [place], LCSW will support the patient's ability to achieve the  goals identified. will employ CBT, BA, Problem-solving, Solution Focused, Mindfulness,  coping skills, & other evidenced-based practices will be used to promote progress towards healthy functioning to help manage decrease symptoms associated with her diagnosis.   Reduce overall level, frequency, and intensity of the feelings of depression and anxiety evidenced by decreased overall symptoms including rumination, negative self-talk, negative automatic thoughts, depressive moods/symptoms from 6 to 7 days/week to 0 to 1 days/week per client report for at least 3 consecutive months. Verbally express understanding of the relationship between feelings of depression, anxiety and their impact on thinking patterns and behaviors. Verbalize an understanding of the role that distorted thinking plays in creating fears, excessive worry, and ruminations.    [name] participated in the creation of the treatment plan)   Buena Irish, LCSW

## 2022-07-09 ENCOUNTER — Telehealth (HOSPITAL_BASED_OUTPATIENT_CLINIC_OR_DEPARTMENT_OTHER): Payer: BC Managed Care – PPO | Admitting: Psychiatry

## 2022-07-09 DIAGNOSIS — F900 Attention-deficit hyperactivity disorder, predominantly inattentive type: Secondary | ICD-10-CM | POA: Diagnosis not present

## 2022-07-09 MED ORDER — AMPHET-DEXTROAMPHET 3-BEAD ER 50 MG PO CP24: 50.0000 mg | ORAL_CAPSULE | Freq: Every day | ORAL | 0 refills | Status: DC

## 2022-07-09 NOTE — Progress Notes (Signed)
Virtual Visit via Video Note  I connected with [name] on 07/09/22 at  9:15 AM EDT by   a video enabled telemedicine application and verified that I am speaking with the correct person using two identifiers.  Location: Patient: home Provider: office   I discussed the limitations of evaluation and management by telemedicine and the availability of in person appointments. The patient expressed understanding and agreed to proceed.  History of Present Illness: This is a 2 week follow up. [name] started on Adderall about 2 weeks ago. She takes it around 8:30am. She is not sure if she feels any improvement in her concentration. [name] notices a decrease in appetite but she is eating before taking the meds, something small in the day and then eating dinner. She gets hungry around 7-8pm. She has not noticed any periods of extra irritability or fatigue. [name] denies headaches, GI upset and insomnia.    Observations/Objective: Psychiatric Specialty Exam: ROS  unknown if currently breastfeeding.There is no height or weight on file to calculate BMI.  General Appearance: Casual and Fairly Groomed  Eye Contact:  Good  Speech:  Clear and Coherent and Normal Rate  Volume:  Normal  Mood:  Euthymic  Affect:  Full Range  Thought Process:  Goal Directed, Linear, and Descriptions of Associations: Intact  Orientation:  Full (Time, Place, and Person)  Thought Content:  Logical  Suicidal Thoughts:  No  Homicidal Thoughts:  No  Memory:  Immediate;   Good  Judgement:  Good  Insight:  Good  Psychomotor Activity:  Normal  Concentration:  Concentration: Fair  Recall:  Good  Fund of Knowledge:  Good  Language:  Good  Akathisia:  No  Handed:  Right  AIMS (if indicated):     Assets:  Communication Skills Desire for Improvement Financial Resources/Insurance [place] Talents/Skills Transportation Vocational/Educational  ADL's:  Intact  Cognition:  WNL  Sleep:         Assessment and Plan:     06/25/2022    9:27 AM 05/15/2022    8:40 AM 04/04/2022    9:41 AM 02/12/2022   11:28 AM 02/16/2020    9:39 AM  Depression screen PHQ 2/9  Decreased Interest '1 1 1 1 1  '$ Down, Depressed, Hopeless '1 1 1 2 2  '$ PHQ - 2 Score '2 2 2 3 3  '$ Altered sleeping '1 1 2 1 '$ 0  Tired, decreased energy 3 0 '3 1 2  '$ Change in appetite '3 1 3 1 2  '$ Feeling bad or failure about yourself  '3 1 1 2 2  '$ Trouble concentrating 3 1 0 3 2  Moving slowly or fidgety/restless 3 0 0 1 0  Suicidal thoughts 0 0 '1 1 1  '$ PHQ-9 Score '18 6 12 13 12  '$ Difficult doing work/chores Somewhat difficult Somewhat difficult Very difficult Somewhat difficult Extremely dIfficult    Flowsheet Row Office Visit from 04/04/2022 in [facility] from 11/10/2020 in [facility] from 11/09/2020 in [facility] Error: Q3, 4, or 5 should not be populated when Q2 is No No Risk No Risk        Pt is aware that these meds carry a teratogenic risk. Pt will discuss plan of action if she does or plans to become pregnant in the future.  Status of current problems: has noticed any improvement in focus with Adderall but denies SE.  Meds: increase  dose of Adderall 1. ADHD, predominantly inattentive type - amphetamine-dextroamphetamine 50 MG CP24; Take 50 mg by mouth daily.  Dispense: 30 capsule; Refill: 0     Labs: none    Therapy: brief supportive therapy provided.     Collaboration of Care: Other none  Patient/Guardian was advised Release of Information must be obtained prior to any record release in order to collaborate their care with an outside provider. Patient/Guardian was advised if they have not already done so to contact the registration department to sign all necessary forms in order for [place] to release information regarding their care.   Consent: Patient/Guardian gives verbal  consent for treatment and assignment of benefits for services provided during this visit. Patient/Guardian expressed understanding and agreed to proceed.      Follow Up Instructions: Follow up in 2-3 weeks or sooner if needed    I discussed the assessment and treatment plan with the patient. The patient was provided an opportunity to ask questions and all were answered. The patient agreed with the plan and demonstrated an understanding of the instructions.   The patient was advised to call back or seek an in-person evaluation if the symptoms worsen or if the condition fails to improve as anticipated.  I provided 8 minutes of non-face-to-face time during this encounter.   Charlcie Cradle, MD

## 2022-07-10 ENCOUNTER — Ambulatory Visit (INDEPENDENT_AMBULATORY_CARE_PROVIDER_SITE_OTHER): Payer: BC Managed Care – PPO | Admitting: Psychology

## 2022-07-10 DIAGNOSIS — F411 Generalized anxiety disorder: Secondary | ICD-10-CM

## 2022-07-10 DIAGNOSIS — F321 Major depressive disorder, single episode, moderate: Secondary | ICD-10-CM

## 2022-07-10 DIAGNOSIS — F900 Attention-deficit hyperactivity disorder, predominantly inattentive type: Secondary | ICD-10-CM | POA: Diagnosis not present

## 2022-07-10 NOTE — Progress Notes (Signed)
[facility] Counselor/Therapist Progress Note  Patient ID: [name], MRN: [id number]   Date: 07/10/22  Time Spent: 10:03 am - 10:53 am : 50 Minutes  Treatment Type: Individual Therapy.  Reported Symptoms: Depression and anxiety.  Mental Status Exam: Appearance:  Neat and Well Groomed     Behavior: Appropriate  Motor: Normal  Speech/Language:  Normal Rate  Affect: Appropriate  Mood: dysthymic  Thought process: normal  Thought content:   WNL  Sensory/Perceptual disturbances:   WNL  Orientation: oriented to person, place, time/date, and situation  Attention: Good  Concentration: Good  Memory: WNL  Fund of knowledge:  Good  Insight:   Good  Judgment:  Good  Impulse Control: Good   Risk Assessment: Danger to Self:  No Self-injurious Behavior: No Danger to Others: No Duty to Warn:no Physical Aggression / Violence:No  Access to Firearms a concern: No  Gang Involvement:No   In case of a mental health emergency:  39 - confidential suicide hotline. [facility]):        [place], [place] [address]       [phone] 3.   911  4.   Visiting Nearest ED.    Subjective:   [name] participated from home, via video and consented to treatment. Therapist participated from home office. We met online due to [place] pandemic. [name] reviewed the events of the past week. She noted experiencing "racing thoughts", intrusive thoughts,  She postulated that this is related to her cycle. She noted her attempts to cope via drawing. Therapist encouraged [name] to keep a log of mood changes to be processed going forward and to discuss with her psychiatric provider, Dr.Agerwal. She noted meeting with her psychiatric provider and noted her medication is increased from 30 mg to 50 mg but noted barriers via insurance. She noted her intent to follow-up with her insurance. She noted frustration regarding access to medication and  the effect of this on her ability to concentrate and complete tasks. She noted her efforts to manage her own stress during day-to-day activities, parenting, and school related stressors. We worked on reviewing coping skills during the session. [name] was engaged and motivated during the session. Therapist praised [name] for her effort during the session and provided supportive therapy.   Interventions: CBT.   Diagnosis:  ADHD, predominantly inattentive type  MDD (major depressive disorder), single episode, moderate (HCC)  GAD (generalized anxiety disorder)   Treatment Plan:  Client Abilities/Strengths [name] is intelligent, forthcoming, and motivated for change.   Support System: Family, husband.   Client Treatment Preferences Outpatient Therapy.   Client Statement of Needs [name] would like to decrease rumination, think more positively, maintain positive behaviors (exercise), and manage overall mood.    Treatment Level Weekly  Symptoms  Depression: Low self-esteem, negative self-talk, negative automatic thoughts, infrequent self-care, feelings of worthlessness, sadness.    (Status: declined) Anxiety: Consistent worry, difficulty managing worry, tension, irritation  (Status: maintained)  Goals:   [name] experiences symptoms of depression, anxiety, and ADHD.    Target Date: 10/31/22 Frequency: Weekly  Progress: 0 Modality: individual    Therapist will provide referrals for additional resources as appropriate.  Therapist will provide psycho-education regarding Yajahira's diagnosis and corresponding treatment approaches and interventions. Licensed Clinical Social Worker, [place], [name] will support the patient's ability to achieve the goals identified. will employ CBT, BA, Problem-solving, Solution Focused, Mindfulness,  coping skills, & other evidenced-based practices will be used to promote progress  towards healthy functioning to help manage decrease symptoms associated  with her diagnosis.   Reduce overall level, frequency, and intensity of the feelings of depression and anxiety evidenced by decreased overall symptoms including rumination, negative self-talk, negative automatic thoughts, depressive moods/symptoms from 6 to 7 days/week to 0 to 1 days/week per client report for at least 3 consecutive months. Verbally express understanding of the relationship between feelings of depression, anxiety and their impact on thinking patterns and behaviors. Verbalize an understanding of the role that distorted thinking plays in creating fears, excessive worry, and ruminations.    [name] participated in the creation of the treatment plan)   Buena Irish, [name]

## 2022-07-13 ENCOUNTER — Telehealth (HOSPITAL_COMMUNITY): Payer: Self-pay

## 2022-07-13 NOTE — Telephone Encounter (Signed)
      Disp Refills Start End   amphetamine-dextroamphetamine 50 MG CP24 30 capsule 0 07/09/2022    Sig - Route: Take 50 mg by mouth daily. - Oral   Sent to pharmacy as: amphetamine-dextroamphetamine 50 MG Capsule SR 24 hr   Earliest Fill Date: 07/09/2022   Notes to Pharmacy: Dose increase   E-Prescribing Status: Receipt confirmed by pharmacy (07/09/2022  9:29 AM EDT)

## 2022-07-23 ENCOUNTER — Telehealth (HOSPITAL_COMMUNITY): Payer: Self-pay | Admitting: *Deleted

## 2022-07-23 NOTE — Telephone Encounter (Signed)
They did not.

## 2022-07-23 NOTE — Telephone Encounter (Signed)
Pt called stating that her insurance, [employer], would not pay for Alba, but possibly for the generic. However generic is what was sent in. Writer spoke with pt pharmacy, [facility] [address], and was advised that her insurance will not pay for either generic or brand. Writer attempted to start a PA on Cover My Meds but was not allowed to proceed and message stated that provider would have a use an alternative medication. Pt next appointment is on 07/30/22. Last visit 07/09/22. Please review.

## 2022-07-24 ENCOUNTER — Ambulatory Visit (INDEPENDENT_AMBULATORY_CARE_PROVIDER_SITE_OTHER): Payer: BC Managed Care – PPO | Admitting: Psychology

## 2022-07-24 DIAGNOSIS — F411 Generalized anxiety disorder: Secondary | ICD-10-CM | POA: Diagnosis not present

## 2022-07-24 DIAGNOSIS — F321 Major depressive disorder, single episode, moderate: Secondary | ICD-10-CM | POA: Diagnosis not present

## 2022-07-24 DIAGNOSIS — F900 Attention-deficit hyperactivity disorder, predominantly inattentive type: Secondary | ICD-10-CM

## 2022-07-24 NOTE — Progress Notes (Signed)
[facility] Counselor/Therapist Progress Note  Patient ID: [name], MRN: [id number]   Date: 07/24/22  Time Spent: 10:07 am - 10:56 am : 49 Minutes  Treatment Type: Individual Therapy.  Reported Symptoms: Depression and anxiety.  Mental Status Exam: Appearance:  Neat and Well Groomed     Behavior: Appropriate  Motor: Normal  Speech/Language:  Normal Rate  Affect: Appropriate  Mood: dysthymic  Thought process: normal  Thought content:   WNL  Sensory/Perceptual disturbances:   WNL  Orientation: oriented to person, place, time/date, and situation  Attention: Good  Concentration: Good  Memory: WNL  Fund of knowledge:  Good  Insight:   Good  Judgment:  Good  Impulse Control: Good   Risk Assessment: Danger to Self:  No Self-injurious Behavior: No Danger to Others: No Duty to Warn:no Physical Aggression / Violence:No  Access to Firearms a concern: No  Gang Involvement:No   In case of a mental health emergency:  28 - confidential suicide hotline. [facility]):        [place], [place] [address]       [phone] 3.   911  4.   Visiting Nearest ED.    Subjective:   [name] participated from home, via video and consented to treatment. Therapist participated from home office. We met online due to [name] pandemic. [name] reviewed the events of the past week. She noted continued efforts to track her mood and cycle and the relation to one another. She noted feeling weepy due to her aunt's upcoming birthday who recently passed (November 15th, 2020). She noted angry at her cousins for the they treated her aunt and have destroyed her home, which [name] and her sister helped with home improvement projects. She noted frustration regarding her cousin's treatment of her aunt despite her aunt's attempts to be nurturing and loving. She noted her aunt being a great support to her during difficult times and that  their relationship was reciprocal, close, and loving. She noted feelings of guilt for her life being busy and caring for her own responsibilities.  She noted missing that spirit and feeling lost. She noted her aunt caring for her children while [name] being in the [employer] . We continued to process this loss and [name] identified her aunt as a mother figure. Therapist validated and normalized  [name]'s feeling and experience.  We discussed ways to normalize her aunt, set boundaries regarding negative thoughts and feelings, redirect grief towards positive feelings while acknowledging the negative feelings.  [name] was engaged and motivated during the session and expressed commitment.  Therapist validated and normalized actions feelings and provided supportive therapy.  Therapist challenged passions negative cognitions the evidence and encouraged past continue the same going forward.  Interventions: CBT.   Diagnosis:  ADHD, predominantly inattentive type  MDD (major depressive disorder), single episode, moderate (HCC)  GAD (generalized anxiety disorder)   Treatment Plan:  Client Abilities/Strengths [name] is intelligent, forthcoming, and motivated for change.   Support System: Family, husband.   Client Treatment Preferences Outpatient Therapy.   Client Statement of Needs [name] would like to decrease rumination, think more positively, maintain positive behaviors (exercise), and manage overall mood.    Treatment Level Weekly  Symptoms  Depression: Low self-esteem, negative self-talk, negative automatic thoughts, infrequent self-care, feelings of worthlessness, sadness.    (Status: declined) Anxiety: Consistent worry, difficulty managing worry, tension, irritation  (Status: maintained)  Goals:   [name] experiences symptoms of depression, anxiety,  and ADHD.    Target Date: 10/31/22 Frequency: Weekly  Progress: 0 Modality: individual    Therapist will provide referrals for  additional resources as appropriate.  Therapist will provide psycho-education regarding [name]'s diagnosis and corresponding treatment approaches and interventions. Licensed Clinical Social Worker, [place], LCSW will support the patient's ability to achieve the goals identified. will employ CBT, BA, Problem-solving, Solution Focused, Mindfulness,  coping skills, & other evidenced-based practices will be used to promote progress towards healthy functioning to help manage decrease symptoms associated with her diagnosis.   Reduce overall level, frequency, and intensity of the feelings of depression and anxiety evidenced by decreased overall symptoms including rumination, negative self-talk, negative automatic thoughts, depressive moods/symptoms from 6 to 7 days/week to 0 to 1 days/week per client report for at least 3 consecutive months. Verbally express understanding of the relationship between feelings of depression, anxiety and their impact on thinking patterns and behaviors. Verbalize an understanding of the role that distorted thinking plays in creating fears, excessive worry, and ruminations.    [name] participated in the creation of the treatment plan)   Buena Irish, LCSW

## 2022-07-30 ENCOUNTER — Telehealth (HOSPITAL_BASED_OUTPATIENT_CLINIC_OR_DEPARTMENT_OTHER): Payer: BC Managed Care – PPO | Admitting: Psychiatry

## 2022-07-30 DIAGNOSIS — F900 Attention-deficit hyperactivity disorder, predominantly inattentive type: Secondary | ICD-10-CM | POA: Diagnosis not present

## 2022-07-30 MED ORDER — METHYLPHENIDATE HCL 10 MG PO TABS: 10.0000 mg | ORAL_TABLET | Freq: Three times a day (TID) | ORAL | 0 refills | Status: DC

## 2022-07-30 NOTE — Progress Notes (Signed)
Virtual Visit via Video Note  I connected with [name] on 07/30/22 at 11:00 AM EST by  a video enabled telemedicine application and verified that I am speaking with the correct person using two identifiers.  Location: Patient: home Provider: office   I discussed the limitations of evaluation and management by telemedicine and the availability of in person appointments. The patient expressed understanding and agreed to proceed.  History of Present Illness: [name] shares that the Adderall was moderately effective but needed an increased dose. She ran out a few days ago. Her insurance will not cover [place]. [name] would like to try getting anything at this point. Her ADHD symptoms are uncontrolled at this point in time.  She is eating and sleeping well. She denies SI/HI.    Observations/Objective: Psychiatric Specialty Exam: ROS  unknown if currently breastfeeding.There is no height or weight on file to calculate BMI.  General Appearance: Casual and Fairly Groomed  Eye Contact:  Good  Speech:  Clear and Coherent and Normal Rate  Volume:  Normal  Mood:  Euthymic  Affect:  Full Range  Thought Process:  Goal Directed, Linear, and Descriptions of Associations: Intact  Orientation:  Full (Time, Place, and Person)  Thought Content:  Logical  Suicidal Thoughts:  No  Homicidal Thoughts:  No  Memory:  Immediate;   Good  Judgement:  Good  Insight:  Good  Psychomotor Activity:  Normal  Concentration:  Concentration: Good  Recall:  Good  Fund of Knowledge:  Good  Language:  Good  Akathisia:  No  Handed:  Right  AIMS (if indicated):     Assets:  Communication Skills Desire for Improvement Financial Resources/Insurance Housing Intimacy Leisure Time Resilience Social Support Talents/Skills Transportation Vocational/Educational  ADL's:  Intact  Cognition:  WNL  Sleep:        Assessment and Plan:     06/25/2022    9:27 AM 05/15/2022    8:40 AM 04/04/2022    9:41 AM  02/12/2022   11:28 AM 02/16/2020    9:39 AM  Depression screen PHQ 2/9  Decreased Interest '1 1 1 1 1  '$ Down, Depressed, Hopeless '1 1 1 2 2  '$ PHQ - 2 Score '2 2 2 3 3  '$ Altered sleeping '1 1 2 1 '$ 0  Tired, decreased energy 3 0 '3 1 2  '$ Change in appetite '3 1 3 1 2  '$ Feeling bad or failure about yourself  '3 1 1 2 2  '$ Trouble concentrating 3 1 0 3 2  Moving slowly or fidgety/restless 3 0 0 1 0  Suicidal thoughts 0 0 '1 1 1  '$ PHQ-9 Score '18 6 12 13 12  '$ Difficult doing work/chores Somewhat difficult Somewhat difficult Very difficult Somewhat difficult Extremely dIfficult    Flowsheet Row Office Visit from 04/04/2022 in [facility] from 11/10/2020 in [facility] from 11/09/2020 in [facility] Error: Q3, 4, or 5 should not be populated when Q2 is No No Risk No Risk        Pt is aware that these meds carry a teratogenic risk. Pt will discuss plan of action if she does or plans to become pregnant in the future.  Status of current problems: ongoing ADHD symptoms  Meds: d/c Adderall Start trial of Ritalin 10-'15mg'$  po BID  1. ADHD, predominantly inattentive type - methylphenidate (RITALIN) 10 MG tablet; Take 1 tablet (10 mg total) by mouth 3 (three)  times daily with meals.  Dispense: 90 tablet; Refill: 0    Therapy: brief supportive therapy provided.    Collaboration of Care: Other none  Patient/Guardian was advised Release of Information must be obtained prior to any record release in order to collaborate their care with an outside provider. Patient/Guardian was advised if they have not already done so to contact the registration department to sign all necessary forms in order for [place] to release information regarding their care.   Consent: Patient/Guardian gives verbal consent for treatment and assignment of benefits for services provided during this visit.  Patient/Guardian expressed understanding and agreed to proceed.       Follow Up Instructions: Follow up in 1 week or sooner if needed    I discussed the assessment and treatment plan with the patient. The patient was provided an opportunity to ask questions and all were answered. The patient agreed with the plan and demonstrated an understanding of the instructions.   The patient was advised to call back or seek an in-person evaluation if the symptoms worsen or if the condition fails to improve as anticipated.  I provided 11 minutes of non-face-to-face time during this encounter.   Charlcie Cradle, MD

## 2022-08-06 ENCOUNTER — Telehealth (HOSPITAL_BASED_OUTPATIENT_CLINIC_OR_DEPARTMENT_OTHER): Payer: BC Managed Care – PPO | Admitting: Psychiatry

## 2022-08-06 DIAGNOSIS — F900 Attention-deficit hyperactivity disorder, predominantly inattentive type: Secondary | ICD-10-CM | POA: Diagnosis not present

## 2022-08-06 MED ORDER — METHYLPHENIDATE HCL 20 MG PO TABS: 20.0000 mg | ORAL_TABLET | Freq: Three times a day (TID) | ORAL | 0 refills | Status: DC

## 2022-08-06 NOTE — Progress Notes (Signed)
Virtual Visit via Video Note  I connected with [name] on 08/06/22 at 10:45 AM EST by   a video enabled telemedicine application and verified that I am speaking with the correct person using two identifiers.  Location: Patient: home Provider: office   I discussed the limitations of evaluation and management by telemedicine and the availability of in person appointments. The patient expressed understanding and agreed to proceed.  History of Present Illness: [name] has been taking Ritalin for about 1 week now. She is taking it about 2-3x/day. [name] has not noticed any "drop off" effect. At '10mg'$  she didn't notice much of an effect. At 15 mg she noticed a small improvement in concentration and focus. She denies decreased appetite, GI upset, headaches, insomnia or increased anxiety and increased irritability. She would like to increase the dose to see if it helps.    Observations/Objective: Psychiatric Specialty Exam: ROS  unknown if currently breastfeeding.There is no height or weight on file to calculate BMI.  General Appearance: Casual  Eye Contact:  Good  Speech:  Clear and Coherent and Normal Rate  Volume:  Normal  Mood:  Euthymic  Affect:  Full Range  Thought Process:  Goal Directed, Linear, and Descriptions of Associations: Intact  Orientation:  Full (Time, Place, and Person)  Thought Content:  Logical  Suicidal Thoughts:  No  Homicidal Thoughts:  No  Memory:  Immediate;   Good  Judgement:  Good  Insight:  Good  Psychomotor Activity:  Normal  Concentration:  Concentration: Good  Recall:  Good  Fund of Knowledge:  Good  Language:  Good  Akathisia:  No  Handed:  Right  AIMS (if indicated):     Assets:  Communication Skills Desire for Improvement Financial Resources/Insurance Housing Intimacy Leisure Time Resilience Social Support Talents/Skills Transportation Vocational/Educational  ADL's:  Intact  Cognition:  WNL  Sleep:        Assessment and Plan:      06/25/2022    9:27 AM 05/15/2022    8:40 AM 04/04/2022    9:41 AM 02/12/2022   11:28 AM 02/16/2020    9:39 AM  Depression screen PHQ 2/9  Decreased Interest '1 1 1 1 1  '$ Down, Depressed, Hopeless '1 1 1 2 2  '$ PHQ - 2 Score '2 2 2 3 3  '$ Altered sleeping '1 1 2 1 '$ 0  Tired, decreased energy 3 0 '3 1 2  '$ Change in appetite '3 1 3 1 2  '$ Feeling bad or failure about yourself  '3 1 1 2 2  '$ Trouble concentrating 3 1 0 3 2  Moving slowly or fidgety/restless 3 0 0 1 0  Suicidal thoughts 0 0 '1 1 1  '$ PHQ-9 Score '18 6 12 13 12  '$ Difficult doing work/chores Somewhat difficult Somewhat difficult Very difficult Somewhat difficult Extremely dIfficult    Flowsheet Row Office Visit from 04/04/2022 in [facility] from 11/10/2020 in [facility] ED from 11/09/2020 in [facility] Error: Q3, 4, or 5 should not be populated when Q2 is No No Risk No Risk        Pt is aware that these meds carry a teratogenic risk. Pt will discuss plan of action if she does or plans to become pregnant in the future.  Status of current problems: small improvement in ADHD symtpoms with Ritalin  Meds: increase Ritalin to 20 mg TID, if no improvement then increase to '30mg'$  BID.  1. ADHD, predominantly inattentive type - methylphenidate (RITALIN) 20 MG tablet; Take 1 tablet (20 mg total) by mouth 3 (three) times daily with meals.  Dispense: 90 tablet; Refill: 0        Therapy: brief supportive therapy provided.     Collaboration of Care: Other none  Patient/Guardian was advised Release of Information must be obtained prior to any record release in order to collaborate their care with an outside provider. Patient/Guardian was advised if they have not already done so to contact the registration department to sign all necessary forms in order for [place] to release information regarding their care.   Consent:  Patient/Guardian gives verbal consent for treatment and assignment of benefits for services provided during this visit. Patient/Guardian expressed understanding and agreed to proceed.      Follow Up Instructions: Follow up in 2-3 weeks or sooner if needed    I discussed the assessment and treatment plan with the patient. The patient was provided an opportunity to ask questions and all were answered. The patient agreed with the plan and demonstrated an understanding of the instructions.   The patient was advised to call back or seek an in-person evaluation if the symptoms worsen or if the condition fails to improve as anticipated.  I provided 9 minutes of non-face-to-face time during this encounter.   Charlcie Cradle, MD

## 2022-08-07 ENCOUNTER — Ambulatory Visit (INDEPENDENT_AMBULATORY_CARE_PROVIDER_SITE_OTHER): Payer: BC Managed Care – PPO | Admitting: Psychology

## 2022-08-07 DIAGNOSIS — F321 Major depressive disorder, single episode, moderate: Secondary | ICD-10-CM | POA: Diagnosis not present

## 2022-08-07 DIAGNOSIS — F900 Attention-deficit hyperactivity disorder, predominantly inattentive type: Secondary | ICD-10-CM

## 2022-08-07 DIAGNOSIS — F411 Generalized anxiety disorder: Secondary | ICD-10-CM

## 2022-08-07 NOTE — Progress Notes (Signed)
[facility] Counselor/Therapist Progress Note  Patient ID: [name], MRN: [id number]   Date: 08/07/22  Time Spent: 10:02 am - 10:58 am : 56 Minutes  Treatment Type: Individual Therapy.  Reported Symptoms: Depression and anxiety.  Mental Status Exam: Appearance:  Neat and Well Groomed     Behavior: Appropriate  Motor: Normal  Speech/Language:  Normal Rate  Affect: Appropriate  Mood: dysthymic  Thought process: normal  Thought content:   WNL  Sensory/Perceptual disturbances:   WNL  Orientation: oriented to person, place, time/date, and situation  Attention: Good  Concentration: Good  Memory: WNL  Fund of knowledge:  Good  Insight:   Good  Judgment:  Good  Impulse Control: Good   Risk Assessment: Danger to Self:  No Self-injurious Behavior: No Danger to Others: No Duty to Warn:no Physical Aggression / Violence:No  Access to Firearms a concern: No  Gang Involvement:No   In case of a mental health emergency:  50 - confidential suicide hotline. [facility]):        [place], [place] [address]       [phone] 3.   911  4.   Visiting Nearest ED.    Subjective:   [name] participated from home, via video and consented to treatment. Therapist participated from home office. We met online due to [place] pandemic. Klee reviewed the events of the past week. Kimari noted having to be switched to Ritalin 20 mg TID due to difficulty with her insurance would not fill her Adderral. She noted her symptoms improving with this medication regimen thus far. She noted unexpected events affecting her energy and sleep. She noted significant worry regarding her husband's possible upcoming surgery and what this might mean for her schooling. She noted is comfort with making a plan but noted not having all the necessary information to make a plan noting that she is jumping to conclusions. She noted having an exam  tomorrow that is creating stress. She noted delaying self-care due to worry that this would take time away from her studying. She identified negative self-talk. We explored this during the session. We discussed self-talk boundaries for self, preparing ahead of time, managing irritability, managing stress proactively. Therapist encouraged Isatou to work on developing a written daily schedule to increase engagement in self-care and completion of tasks. Linday was engaged and motivated during the session. Therapist praised Mercedes and provided supportive therapy.   Interventions: CBT & BA.   Diagnosis:  ADHD, predominantly inattentive type  MDD (major depressive disorder), single episode, moderate (HCC)  GAD (generalized anxiety disorder)   Treatment Plan:  Client Abilities/Strengths Lennox is intelligent, forthcoming, and motivated for change.   Support System: Family, husband.   Client Treatment Preferences Outpatient Therapy.   Client Statement of Needs Kimbrely would like to decrease rumination, think more positively, maintain positive behaviors (exercise), and manage overall mood.    Treatment Level Weekly  Symptoms  Depression: Low self-esteem, negative self-talk, negative automatic thoughts, infrequent self-care, feelings of worthlessness, sadness.    (Status: declined) Anxiety: Consistent worry, difficulty managing worry, tension, irritation  (Status: maintained)  Goals:   Marriah experiences symptoms of depression, anxiety, and ADHD.    Target Date: 10/31/22 Frequency: Weekly  Progress: 0 Modality: individual    Therapist will provide referrals for additional resources as appropriate.  Therapist will provide psycho-education regarding Mi's diagnosis and corresponding treatment approaches and interventions. Licensed Clinical Social Worker, [place], LCSW will support the patient's  ability to achieve the goals identified. will employ CBT, BA, Problem-solving,  Solution Focused, Mindfulness,  coping skills, & other evidenced-based practices will be used to promote progress towards healthy functioning to help manage decrease symptoms associated with her diagnosis.   Reduce overall level, frequency, and intensity of the feelings of depression and anxiety evidenced by decreased overall symptoms including rumination, negative self-talk, negative automatic thoughts, depressive moods/symptoms from 6 to 7 days/week to 0 to 1 days/week per client report for at least 3 consecutive months. Verbally express understanding of the relationship between feelings of depression, anxiety and their impact on thinking patterns and behaviors. Verbalize an understanding of the role that distorted thinking plays in creating fears, excessive worry, and ruminations.    [name] participated in the creation of the treatment plan)   Buena Irish, LCSW

## 2022-08-21 ENCOUNTER — Ambulatory Visit (INDEPENDENT_AMBULATORY_CARE_PROVIDER_SITE_OTHER): Payer: BC Managed Care – PPO | Admitting: Psychology

## 2022-08-21 DIAGNOSIS — F321 Major depressive disorder, single episode, moderate: Secondary | ICD-10-CM | POA: Diagnosis not present

## 2022-08-21 DIAGNOSIS — F411 Generalized anxiety disorder: Secondary | ICD-10-CM | POA: Diagnosis not present

## 2022-08-21 DIAGNOSIS — F900 Attention-deficit hyperactivity disorder, predominantly inattentive type: Secondary | ICD-10-CM

## 2022-08-21 NOTE — Progress Notes (Signed)
[facility] Counselor/Therapist Progress Note  Patient ID: [name], MRN: [id number]   Date: 08/21/22  Time Spent: 10:06 am - 10:53 am : 47 Minutes  Treatment Type: Individual Therapy.  Reported Symptoms: Depression and anxiety.  Mental Status Exam: Appearance:  Neat and Well Groomed     Behavior: Appropriate  Motor: Normal  Speech/Language:  Normal Rate  Affect: Appropriate  Mood: anxious  Thought process: normal  Thought content:   WNL  Sensory/Perceptual disturbances:   WNL  Orientation: oriented to person, place, time/date, and situation  Attention: Good  Concentration: Good  Memory: WNL  Fund of knowledge:  Good  Insight:   Good  Judgment:  Good  Impulse Control: Good   Risk Assessment: Danger to Self:  No Self-injurious Behavior: No Danger to Others: No Duty to Warn:no Physical Aggression / Violence:No  Access to Firearms a concern: No  Gang Involvement:No   In case of a mental health emergency:  39 - confidential suicide hotline. [facility]):        [place], [place] [address]       [phone] 3.   911  4.   Visiting Nearest ED.    Subjective:   [name] participated from home, via video and consented to treatment. Therapist participated from home office. We met online due to [place] pandemic. [name] reviewed the events of the past week. She noted academic stressors and endorsed jumps to conclusion affecting her anxiety. Therapist highlighted [name]'s emotional reasoning and lack of reason and data. Therapist introduced DBT skill Wise-mind, during the session, and provided psych-education regarding this topic and the use of this tool. Therapist modeled this during the session and resources were provided via email for reference and review. We discussed the importance of being mindful and proactive. We discussed this being a tool in possibly reducing rumination. She continues to take  her stimulant medication 73m TID and noted its efficacy. [name] engaged and motivated during the session. Therapist praised [name] provided supportive therapy.   Interventions: CBT & BA.   Diagnosis:  ADHD, predominantly inattentive type  MDD (major depressive disorder), single episode, moderate (HCC)  GAD (generalized anxiety disorder)   Treatment Plan:  Client Abilities/Strengths [name] intelligent, forthcoming, and motivated for change.   Support System: Family, husband.   Client Treatment Preferences Outpatient Therapy.   Client Statement of Needs [name] like to decrease rumination, think more positively, maintain positive behaviors (exercise), and manage overall mood.    Treatment Level Weekly  Symptoms  Depression: Low self-esteem, negative self-talk, negative automatic thoughts, infrequent self-care, feelings of worthlessness, sadness.    (Status: declined) Anxiety: Consistent worry, difficulty managing worry, tension, irritation  (Status: maintained)  Goals:   [name] experiences symptoms of depression, anxiety, and ADHD.    Target Date: 10/31/22 Frequency: Weekly  Progress: 0 Modality: individual    Therapist will provide referrals for additional resources as appropriate.  Therapist will provide psycho-education regarding [name]'s diagnosis and corresponding treatment approaches and interventions. Licensed Clinical Social Worker, [name] will support the patient's ability to achieve the goals identified. will employ CBT, BA, Problem-solving, Solution Focused, Mindfulness,  coping skills, & other evidenced-based practices will be used to promote progress towards healthy functioning to help manage decrease symptoms associated with her diagnosis.   Reduce overall level, frequency, and intensity of the feelings of depression and anxiety evidenced by decreased overall symptoms including rumination, negative self-talk, negative automatic  thoughts,  depressive moods/symptoms from 6 to 7 days/week to 0 to 1 days/week per client report for at least 3 consecutive months. Verbally express understanding of the relationship between feelings of depression, anxiety and their impact on thinking patterns and behaviors. Verbalize an understanding of the role that distorted thinking plays in creating fears, excessive worry, and ruminations.    [name] participated in the creation of the treatment plan)   Mary Irish, White

## 2022-09-02 ENCOUNTER — Ambulatory Visit: Payer: BC Managed Care – PPO | Admitting: Psychology

## 2022-09-03 ENCOUNTER — Telehealth (HOSPITAL_BASED_OUTPATIENT_CLINIC_OR_DEPARTMENT_OTHER): Payer: BC Managed Care – PPO | Admitting: Psychiatry

## 2022-09-03 ENCOUNTER — Encounter (HOSPITAL_COMMUNITY): Payer: Self-pay | Admitting: Psychiatry

## 2022-09-03 DIAGNOSIS — F900 Attention-deficit hyperactivity disorder, predominantly inattentive type: Secondary | ICD-10-CM | POA: Diagnosis not present

## 2022-09-03 MED ORDER — METHYLPHENIDATE HCL 20 MG PO TABS: 30.0000 mg | ORAL_TABLET | Freq: Two times a day (BID) | ORAL | 0 refills | Status: DC

## 2022-09-03 NOTE — Progress Notes (Signed)
Virtual Visit via Video Note  I connected with [name] on 09/03/22 at 10:15 AM EST by  a video enabled telemedicine application and verified that I am speaking with the correct person using two identifiers.  Location: Patient: home Provider: office   I discussed the limitations of evaluation and management by telemedicine and the availability of in person appointments. The patient expressed understanding and agreed to proceed.  History of Present Illness: [name] shares she is doing well. She is looking forward to Christmas. [name] has been taking Ritalin '30mg'$  BID. She is happy with the effect so far. [name] has been functioning and getting things done. She takes Ritalin at 7:30am and the next dose around 12:30pm. The effect wears off after about 4 hrs. She denies any "crashing effect". She denies decrease in appetite, headaches, GI upset, increased irritability and insomnia.    Observations/Objective: Psychiatric Specialty Exam: ROS  unknown if currently breastfeeding.There is no height or weight on file to calculate BMI.  General Appearance: Casual and Fairly Groomed  Eye Contact:  Good  Speech:  Clear and Coherent and Normal Rate  Volume:  Normal  Mood:  Euthymic  Affect:  Full Range  Thought Process:  Goal Directed, Linear, and Descriptions of Associations: Intact  Orientation:  Full (Time, Place, and Person)  Thought Content:  Logical  Suicidal Thoughts:  No  Homicidal Thoughts:  No  Memory:  Immediate;   Good  Judgement:  Good  Insight:  Good  Psychomotor Activity:  Normal  Concentration:  Concentration: Good  Recall:  Good  Fund of Knowledge:  Good  Language:  Good  Akathisia:  No  Handed:  Right  AIMS (if indicated):     Assets:  Communication Skills Desire for Improvement Financial Resources/Insurance Housing Intimacy Leisure Time Physical Health Resilience Social Support Talents/Skills Transportation Vocational/Educational  ADL's:  Intact  Cognition:   WNL  Sleep:        Assessment and Plan:     06/25/2022    9:27 AM 05/15/2022    8:40 AM 04/04/2022    9:41 AM 02/12/2022   11:28 AM 02/16/2020    9:39 AM  Depression screen PHQ 2/9  Decreased Interest '1 1 1 1 1  '$ Down, Depressed, Hopeless '1 1 1 2 2  '$ PHQ - 2 Score '2 2 2 3 3  '$ Altered sleeping '1 1 2 1 '$ 0  Tired, decreased energy 3 0 '3 1 2  '$ Change in appetite '3 1 3 1 2  '$ Feeling bad or failure about yourself  '3 1 1 2 2  '$ Trouble concentrating 3 1 0 3 2  Moving slowly or fidgety/restless 3 0 0 1 0  Suicidal thoughts 0 0 '1 1 1  '$ PHQ-9 Score '18 6 12 13 12  '$ Difficult doing work/chores Somewhat difficult Somewhat difficult Very difficult Somewhat difficult Extremely dIfficult    Flowsheet Row Office Visit from 04/04/2022 in [facility] from 11/10/2020 in [facility] from 11/09/2020 in [facility] Error: Q3, 4, or 5 should not be populated when Q2 is No No Risk No Risk        Pt is aware that these meds carry a teratogenic risk. Pt will discuss plan of action if she does or plans to become pregnant in the future.  Status of current problems: stable ADHD  Meds: increase to Ritalin '30mg'$  BID 1. ADHD, predominantly inattentive type - methylphenidate (RITALIN) 20 MG  tablet; Take 1.5 tablets (30 mg total) by mouth 2 (two) times daily with breakfast and lunch.  Dispense: 90 tablet; Refill: 0 - methylphenidate (RITALIN) 20 MG tablet; Take 1.5 tablets (30 mg total) by mouth 2 (two) times daily with breakfast and lunch.  Dispense: 90 tablet; Refill: 0 - methylphenidate (RITALIN) 20 MG tablet; Take 1.5 tablets (30 mg total) by mouth 2 (two) times daily with breakfast and lunch.  Dispense: 90 tablet; Refill: 0     Labs: none    Therapy: brief supportive therapy provided.    Collaboration of Care: Other none  Patient/Guardian was advised Release of Information  must be obtained prior to any record release in order to collaborate their care with an outside provider. Patient/Guardian was advised if they have not already done so to contact the registration department to sign all necessary forms in order for [place] to release information regarding their care.   Consent: Patient/Guardian gives verbal consent for treatment and assignment of benefits for services provided during this visit. Patient/Guardian expressed understanding and agreed to proceed.     Follow Up Instructions: Follow up in 3 months or sooner if needed    I discussed the assessment and treatment plan with the patient. The patient was provided an opportunity to ask questions and all were answered. The patient agreed with the plan and demonstrated an understanding of the instructions.   The patient was advised to call back or seek an in-person evaluation if the symptoms worsen or if the condition fails to improve as anticipated.  I provided 9 minutes of non-face-to-face time during this encounter.   Charlcie Cradle, MD

## 2022-09-11 ENCOUNTER — Ambulatory Visit (INDEPENDENT_AMBULATORY_CARE_PROVIDER_SITE_OTHER): Payer: BC Managed Care – PPO | Admitting: Psychology

## 2022-09-11 DIAGNOSIS — F900 Attention-deficit hyperactivity disorder, predominantly inattentive type: Secondary | ICD-10-CM

## 2022-09-11 DIAGNOSIS — F321 Major depressive disorder, single episode, moderate: Secondary | ICD-10-CM | POA: Diagnosis not present

## 2022-09-11 DIAGNOSIS — F411 Generalized anxiety disorder: Secondary | ICD-10-CM | POA: Diagnosis not present

## 2022-09-11 NOTE — Progress Notes (Signed)
[facility] Counselor/Therapist Progress Note  Patient ID: [name], MRN: [id number]   Date: 09/11/22  Time Spent: 1:08 pm - 1:54 pm : 46 Minutes  Treatment Type: Individual Therapy.  Reported Symptoms: Depression and anxiety.  Mental Status Exam: Appearance:  Neat and Well Groomed     Behavior: Appropriate  Motor: Normal  Speech/Language:  Normal Rate  Affect: Appropriate  Mood: anxious  Thought process: normal  Thought content:   WNL  Sensory/Perceptual disturbances:   WNL  Orientation: oriented to person, place, time/date, and situation  Attention: Good  Concentration: Good  Memory: WNL  Fund of knowledge:  Good  Insight:   Good  Judgment:  Good  Impulse Control: Good   Risk Assessment: Danger to Self:  No Self-injurious Behavior: No Danger to Others: No Duty to Warn:no Physical Aggression / Violence:No  Access to Firearms a concern: No  Gang Involvement:No   In case of a mental health emergency:  86 - confidential suicide hotline. [facility]):        [place], [place] [address]       [phone] 3.   911  4.   Visiting Nearest ED.    Subjective:   [name] participated from home, via video and consented to treatment. Therapist participated from home office. We met online due to [place] pandemic. [place]on reviewed the events of the past week. She noted a recent decline of sleep due to a busy schedule. She noted going to sleep earlier and waking earlier. She noted waking somewhat tired, as a result. She noted her ADHD medication continuing to be helpful and noted having minimal side-effects related to. She noted some difficulty identifying time to work on her school work. She noted difficulty managing her negative self-talk in relation to not studying as much as she would like due to the holiday break. She noted her active efforts to minimize and challenge her negative self-talk.She noted  anticipatory anxiety due to her husband's upcoming shoulder surgery. She noted the avoidance of thinking about the topic due to the propensity to identify and address all possible variables. She noted difficulty identifying her high level of anxiety and how to extinguish.  We worked on possible signs of increasing anxiety including irritability, reactions out of proportion to stressors, and people's feedback. Zilphia noted having an app that asks her to check in with self and noted her intent to employing this tool, going forward. We worked on identifying ways to manage rumination via boundaries, planned worry, and self-talk. Therapist modeled this during the session. Sally-Anne was engaged and motivated during the session. Therapist praised Lasa[place]ra and provided supportive therapy.   Interventions: CBT    Diagnosis:  ADHD, predominantly inattentive type  MDD (major depressive disorder), single episode, moderate (HCC)  GAD (generalized anxiety disorder)   Treatment Plan:  Client Abilities/Strengths Lizanne is intelligent, forthcoming, and motivated for change.   Support System: Family, husband.   Client Treatment Preferences Outpatient Therapy.   Client Statement of Needs Breauna would like to decrease rumination, think more positively, maintain positive behaviors (exercise), and manage overall mood.    Treatment Level Weekly  Symptoms  Depression: Low self-esteem, negative self-talk, negative automatic thoughts, infrequent self-care, feelings of worthlessness, sadness.    (Status: declined) Anxiety: Consistent worry, difficulty managing worry, tension, irritation  (Status: maintained)  Goals:   Donn experiences symptoms of depression, anxiety, and ADHD.    Target Date: 10/31/22 Frequency: Weekly  Progress: 0  Modality: individual    Therapist will provide referrals for additional resources as appropriate.  Therapist will provide psycho-education regarding Chanee's diagnosis and  corresponding treatment approaches and interventions. Licensed Clinical Social Worker, [place], LCSW will support the patient's ability to achieve the goals identified. will employ CBT, BA, Problem-solving, Solution Focused, Mindfulness,  coping skills, & other evidenced-based practices will be used to promote progress towards healthy functioning to help manage decrease symptoms associated with her diagnosis.   Reduce overall level, frequency, and intensity of the feelings of depression and anxiety evidenced by decreased overall symptoms including rumination, negative self-talk, negative automatic thoughts, depressive moods/symptoms from 6 to 7 days/week to 0 to 1 days/week per client report for at least 3 consecutive months. Verbally express understanding of the relationship between feelings of depression, anxiety and their impact on thinking patterns and behaviors. Verbalize an understanding of the role that distorted thinking plays in creating fears, excessive worry, and ruminations.    [name] participated in the creation of the treatment plan)   Buena Irish, LCSW

## 2022-10-02 ENCOUNTER — Ambulatory Visit (INDEPENDENT_AMBULATORY_CARE_PROVIDER_SITE_OTHER): Payer: BC Managed Care – PPO | Admitting: Psychology

## 2022-10-02 DIAGNOSIS — F321 Major depressive disorder, single episode, moderate: Secondary | ICD-10-CM | POA: Diagnosis not present

## 2022-10-02 DIAGNOSIS — F900 Attention-deficit hyperactivity disorder, predominantly inattentive type: Secondary | ICD-10-CM | POA: Diagnosis not present

## 2022-10-02 DIAGNOSIS — F411 Generalized anxiety disorder: Secondary | ICD-10-CM | POA: Diagnosis not present

## 2022-10-02 NOTE — Progress Notes (Signed)
[facility] Counselor/Therapist Progress Note  Patient ID: [name], MRN: [id number]   Date: 10/02/22  Time Spent: 11:02 am - 11:51 am : 38 Minutes  Treatment Type: Individual Therapy.  Reported Symptoms: Depression and anxiety.  Mental Status Exam: Appearance:  Neat and Well Groomed     Behavior: Appropriate  Motor: Normal  Speech/Language:  Normal Rate  Affect: Appropriate  Mood: anxious  Thought process: normal  Thought content:   WNL  Sensory/Perceptual disturbances:   WNL  Orientation: oriented to person, place, time/date, and situation  Attention: Good  Concentration: Good  Memory: WNL  Fund of knowledge:  Good  Insight:   Good  Judgment:  Good  Impulse Control: Good   Risk Assessment: Danger to Self:  No Self-injurious Behavior: No Danger to Others: No Duty to Warn:no Physical Aggression / Violence:No  Access to Firearms a concern: No  Gang Involvement:No   In case of a mental health emergency:  56 - confidential suicide hotline. [facility]):        [place], [place] [address]       [phone] 3.   911  4.   Visiting Nearest ED.    Subjective:   [name] participated from home, via video and consented to treatment. Therapist participated from home office. We met online due to [place] pandemic. [name] reviewed the events of the past week. She reflected on her her Christmas and new years. She noted numerous illnesses in the family including a cousin with pancreatic cancer and a different cousin with breast cancer. She noted a loss of a third cousin. She noted an additional loss, as well. She noted difficulty processing the varying illnesses and passing of family member. She identified feelings of sadness, weepy, guilty, and being overwhelmed, as well, but noted not being sure how to feel. We processed this during the session. She noted a need to set boundaries with her step-mother. We  explored this during the session and her worry that said boundaries will devolve into argument. We processed this and her past attempts to set boundaries with others and the effect of this on her interactions and overall mood. She noted feeling a bit "wimpy" due to the anxiety. We discussed the effect of avoidence in relation to increased psychological and physiological tension. [name] was engaged and motivated and expressed commitment towards our goals. Therapist praised [name] and provided supportive therapy. A follow-up was scheduled for continued treatment.   Interventions: CBT    Diagnosis:   ADHD, predominantly inattentive type  MDD (major depressive disorder), single episode, moderate (HCC)  GAD (generalized anxiety disorder)   Treatment Plan:  Client Abilities/Strengths [name] is intelligent, forthcoming, and motivated for change.   Support System: Family, husband.   Client Treatment Preferences Outpatient Therapy.   Client Statement of Needs [name] would like to decrease rumination, think more positively, maintain positive behaviors (exercise), and manage overall mood.    Treatment Level Weekly  Symptoms  Depression: Low self-esteem, negative self-talk, negative automatic thoughts, infrequent self-care, feelings of worthlessness, sadness.    (Status: declined) Anxiety: Consistent worry, difficulty managing worry, tension, irritation  (Status: maintained)  Goals:   [name] experiences symptoms of depression, anxiety, and ADHD.    Target Date: 10/31/22 Frequency: Weekly  Progress: 0 Modality: individual    Therapist will provide referrals for additional resources as appropriate.  Therapist will provide psycho-education regarding [name]'s diagnosis and corresponding treatment approaches and interventions. Licensed [place] representative,  Buena Irish, LCSW will support the patient's ability to achieve the goals identified. will employ CBT, BA, Problem-solving,  Solution Focused, Mindfulness,  coping skills, & other evidenced-based practices will be used to promote progress towards healthy functioning to help manage decrease symptoms associated with her diagnosis.   Reduce overall level, frequency, and intensity of the feelings of depression and anxiety evidenced by decreased overall symptoms including rumination, negative self-talk, negative automatic thoughts, depressive moods/symptoms from 6 to 7 days/week to 0 to 1 days/week per client report for at least 3 consecutive months. Verbally express understanding of the relationship between feelings of depression, anxiety and their impact on thinking patterns and behaviors. Verbalize an understanding of the role that distorted thinking plays in creating fears, excessive worry, and ruminations.    [name] participated in the creation of the treatment plan)   Buena Irish, LCSW

## 2022-10-16 ENCOUNTER — Ambulatory Visit (INDEPENDENT_AMBULATORY_CARE_PROVIDER_SITE_OTHER): Payer: BC Managed Care – PPO | Admitting: Psychology

## 2022-10-16 DIAGNOSIS — F900 Attention-deficit hyperactivity disorder, predominantly inattentive type: Secondary | ICD-10-CM

## 2022-10-16 DIAGNOSIS — F411 Generalized anxiety disorder: Secondary | ICD-10-CM

## 2022-10-16 DIAGNOSIS — F321 Major depressive disorder, single episode, moderate: Secondary | ICD-10-CM

## 2022-10-16 NOTE — Progress Notes (Signed)
[facility] Counselor/Therapist Progress Note  Patient ID: [name], MRN: [id number]   Date: 10/16/22  Time Spent: 10:04 am - 10:51 am : 38 Minutes  Treatment Type: Individual Therapy.  Reported Symptoms: Depression and anxiety.  Mental Status Exam: Appearance:  Neat and Well Groomed     Behavior: Appropriate  Motor: Normal  Speech/Language:  Normal Rate  Affect: Appropriate  Mood: anxious  Thought process: normal  Thought content:   WNL  Sensory/Perceptual disturbances:   WNL  Orientation: oriented to person, place, time/date, and situation  Attention: Good  Concentration: Good  Memory: WNL  Fund of knowledge:  Good  Insight:   Good  Judgment:  Good  Impulse Control: Good   Risk Assessment: Danger to Self:  No Self-injurious Behavior: No Danger to Others: No Duty to Warn:no Physical Aggression / Violence:No  Access to Firearms a concern: No  Gang Involvement:No   In case of a mental health emergency:  54 - confidential suicide hotline. [facility]):        [place], [place] [address]       [phone] 3.   911  4.   Visiting Nearest ED.    Subjective:   [name] participated from home, via video and consented to treatment. Therapist participated from home office. We met online due to [place] pandemic. [name] reviewed the events of the past week. She noted avoidance to communicating boundaries with her step-mother regarding unscheduled visits and uncertain duration of visits. She noted feeling regret to not communicating boundaries. She noted "stress cleaning" during this time only for the visit to not come to fruition. She noted not having a relationship with her step-mother at all. She noted her step-mother is overstepping boundaries. We worked on Radio broadcast assistant of her step-mother's behavior. We discussed the benefits and costs of her approach and avoidance in this space and the  distress she feels in relation to setting boundaries. Therapist reviewed distress tolerance and radical acceptance during the session. Resources were provided via email for reference and review. We practiced this during the session in relation to her stressors with her step-mother.  [name] was engaged and motivated and expressed commitment towards our goals and committed to practicing these tools between sessions.. Therapist praised [name] and provided supportive therapy. A follow-up was scheduled for continued treatment.   Interventions: DBT and psycho-education.    Diagnosis:   ADHD, predominantly inattentive type  MDD (major depressive disorder), single episode, moderate (HCC)  GAD (generalized anxiety disorder)   Treatment Plan:  Client Abilities/Strengths [name] is intelligent, forthcoming, and motivated for change.   Support System: Family, husband.   Client Treatment Preferences Outpatient Therapy.   Client Statement of Needs [name] would like to decrease rumination, think more positively, maintain positive behaviors (exercise), and manage overall mood.    Treatment Level Weekly  Symptoms  Depression: Low self-esteem, negative self-talk, negative automatic thoughts, infrequent self-care, feelings of worthlessness, sadness.    (Status: declined) Anxiety: Consistent worry, difficulty managing worry, tension, irritation  (Status: maintained)  Goals:   [name] experiences symptoms of depression, anxiety, and ADHD.    Target Date: 10/31/22 Frequency: Weekly  Progress: 0 Modality: individual    Therapist will provide referrals for additional resources as appropriate.  Therapist will provide psycho-education regarding [name]'s diagnosis and corresponding treatment approaches and interventions. Licensed Clinical Social Worker, [place], LCSW will support the patient's ability to achieve the goals identified. will employ CBT, BA, Problem-solving, Solution  Focused,  Mindfulness,  coping skills, & other evidenced-based practices will be used to promote progress towards healthy functioning to help manage decrease symptoms associated with her diagnosis.   Reduce overall level, frequency, and intensity of the feelings of depression and anxiety evidenced by decreased overall symptoms including rumination, negative self-talk, negative automatic thoughts, depressive moods/symptoms from 6 to 7 days/week to 0 to 1 days/week per client report for at least 3 consecutive months. Verbally express understanding of the relationship between feelings of depression, anxiety and their impact on thinking patterns and behaviors. Verbalize an understanding of the role that distorted thinking plays in creating fears, excessive worry, and ruminations.    [name] participated in the creation of the treatment plan)   Buena Irish, LCSW

## 2022-10-30 ENCOUNTER — Ambulatory Visit (INDEPENDENT_AMBULATORY_CARE_PROVIDER_SITE_OTHER): Payer: BC Managed Care – PPO | Admitting: Psychology

## 2022-10-30 DIAGNOSIS — F321 Major depressive disorder, single episode, moderate: Secondary | ICD-10-CM

## 2022-10-30 DIAGNOSIS — F411 Generalized anxiety disorder: Secondary | ICD-10-CM | POA: Diagnosis not present

## 2022-10-30 DIAGNOSIS — F900 Attention-deficit hyperactivity disorder, predominantly inattentive type: Secondary | ICD-10-CM | POA: Diagnosis not present

## 2022-10-30 NOTE — Progress Notes (Signed)
[facility] Counselor/Therapist Progress Note  Patient ID: [name], MRN: [id number]   Date: 10/30/22  Time Spent: 10:01 am - 10:46 am : 45 Minutes  Treatment Type: Individual Therapy.  Reported Symptoms: Depression and anxiety.  Mental Status Exam: Appearance:  Neat and Well Groomed     Behavior: Appropriate  Motor: Normal  Speech/Language:  Normal Rate  Affect: Appropriate  Mood: anxious  Thought process: normal  Thought content:   WNL  Sensory/Perceptual disturbances:   WNL  Orientation: oriented to person, place, time/date, and situation  Attention: Good  Concentration: Good  Memory: WNL  Fund of knowledge:  Good  Insight:   Good  Judgment:  Good  Impulse Control: Good   Risk Assessment: Danger to Self:  No Self-injurious Behavior: No Danger to Others: No Duty to Warn:no Physical Aggression / Violence:No  Access to Firearms a concern: No  Gang Involvement:No   In case of a mental health emergency:  43 - confidential suicide hotline. [name] [facility]):        [place], [place] [address]       [phone] 3.   911  4.   Visiting Nearest ED.    Subjective:   [name] participated from home, via video and consented to treatment. Therapist participated from home office. We met online due to [name] pandemic. [name] reviewed the events of the past week. She noted feeling tired due to needing to complete tasks for her children's school. She noted familial interpersonal stressors at her mother's home as her mother visit's [name]'s home. She noted frustration regarding the situation and how her mother was treated. She noted an upcoming transition, her husband's surgery, and noted a need to create a plan of action during his rehab time. She noted managing this stress more effectively. She noted working on tracking her mood in relation to her cycle. She noted this self-awareness aiding in managing her  mood, overall. She noted feeling good to be able to predict the change in her mood. She noted difficulty getting refills for her ADHD medication. She noted working on addressing this issue. She noted difficulty with her eldest daughter's entry into adulthood and the adjustment to the necessitated change in parenting style. She noted her hopes that [name] does not make similar mistakes as her. We explored this transition for [name] and her husband. We discussed her attempts to manage this transition and identifying ways to communicate this to her daughter. Therapist validated [name]'s experience and feelings and experience and provided supportive therapy. A follow-up was scheduled for continued treatment.   Interventions: CBT  Diagnosis:   ADHD, predominantly inattentive type  MDD (major depressive disorder), single episode, moderate (HCC)  GAD (generalized anxiety disorder)   Treatment Plan:  Client Abilities/Strengths [name] is intelligent, forthcoming, and motivated for change.   Support System: Family, husband.   Client Treatment Preferences Outpatient Therapy.   Client Statement of Needs [name] would like to decrease rumination, think more positively, maintain positive behaviors (exercise), and manage overall mood.    Treatment Level Weekly  Symptoms  Depression: Low self-esteem, negative self-talk, negative automatic thoughts, infrequent self-White, feelings of worthlessness, sadness.    (Status: maintained) Anxiety: Consistent worry, difficulty managing worry, tension, irritation  (Status: maintained)  Goals:   [name] experiences symptoms of depression, anxiety, and ADHD.    Target Date: 10/31/22 Frequency: Weekly  Progress: 0 Modality: individual    Therapist will provide referrals for additional resources as appropriate.  Therapist will provide psycho-education regarding [name]'s diagnosis and corresponding treatment approaches and interventions. Licensed Clinical  Social Worker, [place], LCSW will support the patient's ability to achieve the goals identified. will employ CBT, BA, Problem-solving, Solution Focused, Mindfulness,  coping skills, & other evidenced-based practices will be used to promote progress towards healthy functioning to help manage decrease symptoms associated with her diagnosis.   Reduce overall level, frequency, and intensity of the feelings of depression and anxiety evidenced by decreased overall symptoms including rumination, negative self-talk, negative automatic thoughts, depressive moods/symptoms from 6 to 7 days/week to 0 to 1 days/week per client report for at least 3 consecutive months. Verbally express understanding of the relationship between feelings of depression, anxiety and their impact on thinking patterns and behaviors. Verbalize an understanding of the role that distorted thinking plays in creating fears, excessive worry, and ruminations.    [name] participated in the creation of the treatment plan)   Mary Irish, LCSW

## 2022-11-13 ENCOUNTER — Ambulatory Visit (INDEPENDENT_AMBULATORY_CARE_PROVIDER_SITE_OTHER): Payer: BC Managed Care – PPO | Admitting: Psychology

## 2022-11-13 DIAGNOSIS — F321 Major depressive disorder, single episode, moderate: Secondary | ICD-10-CM

## 2022-11-13 DIAGNOSIS — F411 Generalized anxiety disorder: Secondary | ICD-10-CM

## 2022-11-13 DIAGNOSIS — F900 Attention-deficit hyperactivity disorder, predominantly inattentive type: Secondary | ICD-10-CM
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
## 2022-11-26 ENCOUNTER — Telehealth (HOSPITAL_COMMUNITY): Payer: BC Managed Care – PPO | Admitting: Psychiatry

## 2022-11-27 ENCOUNTER — Ambulatory Visit: Payer: BC Managed Care – PPO | Admitting: Psychology

## 2022-12-03 ENCOUNTER — Telehealth (HOSPITAL_BASED_OUTPATIENT_CLINIC_OR_DEPARTMENT_OTHER): Payer: BC Managed Care – PPO | Admitting: Psychiatry

## 2022-12-03 DIAGNOSIS — F900 Attention-deficit hyperactivity disorder, predominantly inattentive type: Secondary | ICD-10-CM

## 2022-12-03 MED ORDER — METHYLPHENIDATE HCL 20 MG PO TABS: 30.0000 mg | ORAL_TABLET | Freq: Two times a day (BID) | ORAL | 0 refills | Status: DC

## 2022-12-03 NOTE — Progress Notes (Signed)
Virtual Visit via Video Note  I connected with [name] on 12/03/22 at 10:30 AM EDT by  a video enabled telemedicine application and verified that I am speaking with the correct person using two identifiers.  Location: Patient: home Provider: office   I discussed the limitations of evaluation and management by telemedicine and the availability of in person appointments. The patient expressed understanding and agreed to proceed.  History of Present Illness: [name] is doing well. She has had trouble adjusting to the recent time change. Her husband had shoulder surgery a couple of weeks ago. He is recovering well but she is having to do more around the house. Her sleep is fair. [name] shares that increasing Ritalin to '30mg'$  BID has been good. She didn't get to start the increased dose until February. She is productive and focused. She takes it around 8-9am and feels it wear off 12:30pm. She will then eat and then takes her second dose around 1pm and it wears off around 4pm.  Her appetite is good and she has been eating well. She denies headaches, GI upset, irritability and anxiety. She attending therapy session 2x/month.    Observations/Objective: Psychiatric Specialty Exam: ROS  unknown if currently breastfeeding.There is no height or weight on file to calculate BMI.  General Appearance: Neat and Well Groomed  Eye Contact:  Good  Speech:  Clear and Coherent and Normal Rate  Volume:  Normal  Mood:  Euthymic  Affect:  Full Range  Thought Process:  Goal Directed, Linear, and Descriptions of Associations: Intact  Orientation:  Full (Time, Place, and Person)  Thought Content:  Logical  Suicidal Thoughts:  No  Homicidal Thoughts:  No  Memory:  Immediate;   Good  Judgement:  Good  Insight:  Good  Psychomotor Activity:  Normal  Concentration:  Concentration: Good  Recall:  Good  Fund of Knowledge:  Good  Language:  Good  Akathisia:  No  Handed:  Right  AIMS (if indicated):     Assets:   Communication Skills Desire for Improvement Financial Resources/Insurance [place] Talents/Skills Transportation Vocational/Educational  ADL's:  Intact  Cognition:  WNL  Sleep:        Assessment and Plan:     11/13/2022   10:42 AM 06/25/2022    9:27 AM 05/15/2022    8:40 AM 04/04/2022    9:41 AM 02/12/2022   11:28 AM  Depression screen PHQ 2/9  Decreased Interest 0 '1 1 1 1  '$ Down, Depressed, Hopeless 0 '1 1 1 2  '$ PHQ - 2 Score 0 '2 2 2 3  '$ Altered sleeping 0 '1 1 2 1  '$ Tired, decreased energy 0 3 0 3 1  Change in appetite 0 '3 1 3 1  '$ Feeling bad or failure about yourself  '1 3 1 1 2  '$ Trouble concentrating 0 3 1 0 3  Moving slowly or fidgety/restless 0 3 0 0 1  Suicidal thoughts 0 0 0 1 1  PHQ-9 Score '1 18 6 12 13  '$ Difficult doing work/chores  Somewhat difficult Somewhat difficult Very difficult Somewhat difficult    Flowsheet Row Office Visit from 04/04/2022 in [facility] ED from 11/10/2020 in [facility] Emergency Department at [facility] ED from 11/09/2020 in [facility] Emergency Department at [place] Error: Q3, 4, or 5 should not be populated when Q2 is No No Risk No Risk  Pt is aware that these meds carry a teratogenic risk. Pt will discuss plan of action if she does or plans to become pregnant in the future.  Status of current problems: stable ADHD symptoms   Medication management with supportive therapy. Risks and benefits, side effects and alternative treatment options discussed with patient. Pt was given an opportunity to ask questions about medication, illness, and treatment. All current psychiatric medications have been reviewed and discussed with the patient and adjusted as clinically appropriate.  Pt verbalized understanding and verbal consent obtained for treatment.  Meds: she has 2 Ritalin scripts she has not filled yet 1. ADHD,  predominantly inattentive type - methylphenidate (RITALIN) 20 MG tablet; Take 1.5 tablets (30 mg total) by mouth 2 (two) times daily with breakfast and lunch.  Dispense: 90 tablet; Refill: 0     Labs: none    Therapy: brief supportive therapy provided. Discussed psychosocial stressors in detail.     Collaboration of Care: Other therapy 2x/month  Patient/Guardian was advised Release of Information must be obtained prior to any record release in order to collaborate their care with an outside provider. Patient/Guardian was advised if they have not already done so to contact the registration department to sign all necessary forms in order for [place] to release information regarding their care.   Consent: Patient/Guardian gives verbal consent for treatment and assignment of benefits for services provided during this visit. Patient/Guardian expressed understanding and agreed to proceed.      Follow Up Instructions: Follow up in 3-4 months or sooner if needed    I discussed the assessment and treatment plan with the patient. The patient was provided an opportunity to ask questions and all were answered. The patient agreed with the plan and demonstrated an understanding of the instructions.   The patient was advised to call back or seek an in-person evaluation if the symptoms worsen or if the condition fails to improve as anticipated.  I provided 9 minutes of non-face-to-face time during this encounter.   Charlcie Cradle, MD

## 2022-12-11 ENCOUNTER — Ambulatory Visit (INDEPENDENT_AMBULATORY_CARE_PROVIDER_SITE_OTHER): Payer: BC Managed Care – PPO | Admitting: Psychology

## 2022-12-11 DIAGNOSIS — F411 Generalized anxiety disorder: Secondary | ICD-10-CM | POA: Diagnosis not present

## 2022-12-11 DIAGNOSIS — F321 Major depressive disorder, single episode, moderate: Secondary | ICD-10-CM

## 2022-12-11 DIAGNOSIS — F900 Attention-deficit hyperactivity disorder, predominantly inattentive type: Secondary | ICD-10-CM | POA: Diagnosis not present

## 2022-12-11 NOTE — Progress Notes (Signed)
[facility] Counselor/Therapist Progress Note  Patient ID: [name], MRN: [id number]   Date: 12/11/22  Time Spent: 10:06  am - 10:54 am : 48 Minutes  Treatment Type: Individual Therapy.  Reported Symptoms: depression, anxiety, and ADHD.   Mental Status Exam: Appearance:  Neat and Well Groomed     Behavior: Appropriate  Motor: Normal  Speech/Language:  Clear and Coherent  Affect: Congruent  Mood: normal  Thought process: normal  Thought content:   WNL  Sensory/Perceptual disturbances:   WNL  Orientation: oriented to person, place, time/date, and situation  Attention: Good  Concentration: Good  Memory: WNL  Fund of knowledge:  Good  Insight:   Good  Judgment:  Good  Impulse Control: Good   Risk Assessment: Danger to Self:  No Self-injurious Behavior: No Danger to Others: No Duty to Warn:no Physical Aggression / Violence:No  Access to Firearms a concern: No  Gang Involvement:No   In case of a mental health emergency:  34 - confidential suicide hotline. [facility]):        [place],  [id number]       [phone] 3.   911  4.   Visiting Nearest ED.    Subjective:   [name] participated from home, via video and consented to treatment. Therapist participated from home office. We met online due to [place] pandemic. [name] reviewed the events of the past week. We reviewed numerous treatment approaches including CBT, BA, Problem Solving, and Solution focused therapy. Psych-education regarding the [name]'s diagnosis of ADHD, predominantly inattentive type  MDD (major depressive disorder), single episode, moderate (HCC)  GAD (generalized anxiety disorder) was provided during the session. We discussed [name] goals treatment goals which include improve coping, manage day-to-day stressors, improve frustration tolerance, improve mindfulness, increase consistency with exercise and self-care,  increasing time to self, manage negative self-talk, staying organized and on task, think more positively.  [name] provided verbal approval of the treatment plan. [name] denied any safety concerns and is aware of safety protocol. [name] noted a need to communicate her needs more consistently and take breaks to de-stress. [name] noted a need for time alone. We worked on exploring this during the session, ways to communicating needs, and problem-solving possible barriers. [name] noted a need to manage her frustrations during driving. We explored this during the session. Therapist provided supportive therapy.   Interventions: Psycho-education & Goal Setting.   Diagnosis:  ADHD, predominantly inattentive type  MDD (major depressive disorder), single episode, moderate (HCC)  GAD (generalized anxiety disorder)  Psychiatric Treatment: Yes , Dr. Shirley Friar MD. Please see chart.    Treatment Plan:  Client Abilities/Strengths [name] is intelligent, self-aware, and motivated for change.   Support System: Family and Friends.   Client Treatment Preferences OPT  Client Statement of Needs [name] would like to improve coping, manage day-to-day stressors, improve frustration tolerance, improve mindfulness, increase consistency with exercise and self-care, increasing time to self, manage negative self-talk, staying organized and on task,  think more positively.    Treatment Level Weekly  Symptoms  GAD: Anxious, difficulty managing worry, worrying about different things, trouble relaxing, irritability.    (Status: maintained) Depression: Feeling bad about self, negative self-talk, feelings of guilt.    (Status: maintained)  Goals:   [name] experiences symptoms of depression, anxiety, and ADHD.   Target Date: 12/11/23 Frequency: Weekly  Progress: 0 Modality: individual    Therapist will provide referrals for additional resources as  appropriate.  Therapist will provide psycho-education regarding  [name]'s diagnosis and corresponding treatment approaches and interventions. Licensed Clinical Social Worker, [place], LCSW will support the patient's ability to achieve the goals identified. will employ CBT, BA, Problem-solving, Solution Focused, Mindfulness,  coping skills, & other evidenced-based practices will be used to promote progress towards healthy functioning to help manage decrease symptoms associated with her diagnosis.   Reduce overall level, frequency, and intensity of the feelings of depression and anxiety as evidenced by decreased overall symptoms from 6 to 7 days/week to 0 to 1 days/week per client report for at least 3 consecutive months. Verbally express understanding of the relationship between feelings of depression, anxiety and their impact on thinking patterns and behaviors. Verbalize an understanding of the role that distorted thinking plays in creating fears, excessive worry, and ruminations.    [name] participated in the creation of the treatment plan)    Buena Irish, LCSW

## 2023-01-03 ENCOUNTER — Emergency Department (HOSPITAL_COMMUNITY): Payer: BC Managed Care – PPO

## 2023-01-03 ENCOUNTER — Encounter (HOSPITAL_COMMUNITY): Payer: Self-pay

## 2023-01-03 ENCOUNTER — Emergency Department (HOSPITAL_COMMUNITY)
Admission: EM | Admit: 2023-01-03 | Discharge: 2023-01-03 | Disposition: A | Discharge status: Home / Self Care | Payer: BC Managed Care – PPO | Attending: Emergency Medicine | Admitting: Emergency Medicine

## 2023-01-03 DIAGNOSIS — N83202 Unspecified ovarian cyst, left side: Secondary | ICD-10-CM | POA: Diagnosis not present

## 2023-01-03 DIAGNOSIS — D259 Leiomyoma of uterus, unspecified: Secondary | ICD-10-CM | POA: Diagnosis not present

## 2023-01-03 DIAGNOSIS — R109 Unspecified abdominal pain: Secondary | ICD-10-CM | POA: Diagnosis not present

## 2023-01-03 DIAGNOSIS — D251 Intramural leiomyoma of uterus: Secondary | ICD-10-CM | POA: Diagnosis not present

## 2023-01-03 DIAGNOSIS — R1031 Right lower quadrant pain: Secondary | ICD-10-CM

## 2023-01-03 LAB — URINALYSIS, ROUTINE W REFLEX MICROSCOPIC
Bacteria, UA: NONE SEEN
Bilirubin Urine: NEGATIVE
Glucose, UA: NEGATIVE mg/dL
Hgb urine dipstick: NEGATIVE
Ketones, ur: NEGATIVE mg/dL
Nitrite: NEGATIVE
Protein, ur: NEGATIVE mg/dL
Specific Gravity, Urine: 1.04 — ABNORMAL HIGH (ref 1.005–1.030)
pH: 6 (ref 5.0–8.0)

## 2023-01-03 LAB — COMPREHENSIVE METABOLIC PANEL
ALT: 25 U/L (ref 0–44)
AST: 24 U/L (ref 15–41)
Albumin: 4.2 g/dL (ref 3.5–5.0)
Alkaline Phosphatase: 53 U/L (ref 38–126)
Anion gap: 7 (ref 5–15)
BUN: 14 mg/dL (ref 6–20)
CO2: 24 mmol/L (ref 22–32)
Calcium: 9.1 mg/dL (ref 8.9–10.3)
Chloride: 105 mmol/L (ref 98–111)
Creatinine, Ser: 0.84 mg/dL (ref 0.44–1.00)
GFR, Estimated: >60 mL/min (ref >60)
Glucose, Bld: 139 mg/dL — ABNORMAL HIGH (ref 70–99)
Potassium: 3.8 mmol/L (ref 3.5–5.1)
Sodium: 136 mmol/L (ref 135–145)
Total Bilirubin: 0.4 mg/dL (ref 0.3–1.2)
Total Protein: 7.6 g/dL (ref 6.5–8.1)

## 2023-01-03 LAB — I-STAT CHEM 8, ED
BUN: 12 mg/dL (ref 6–20)
Calcium, Ion: 1.19 mmol/L (ref 1.15–1.40)
Chloride: 102 mmol/L (ref 98–111)
Creatinine, Ser: 0.7 mg/dL (ref 0.44–1.00)
Glucose, Bld: 107 mg/dL — ABNORMAL HIGH (ref 70–99)
HCT: 42 % (ref 36.0–46.0)
Hemoglobin: 14.3 g/dL (ref 12.0–15.0)
Potassium: 4.1 mmol/L (ref 3.5–5.1)
Sodium: 139 mmol/L (ref 135–145)
TCO2: 24 mmol/L (ref 22–32)

## 2023-01-03 LAB — LIPASE, BLOOD: Lipase: 31 U/L (ref 11–51)

## 2023-01-03 LAB — I-STAT BETA HCG BLOOD, ED (MC, WL, AP ONLY): I-stat hCG, quantitative: <5 m[IU]/mL (ref <5)

## 2023-01-03 LAB — CBC
HCT: 40.4 % (ref 36.0–46.0)
Hemoglobin: 12.6 g/dL (ref 12.0–15.0)
MCH: 25 pg — ABNORMAL LOW (ref 26.0–34.0)
MCHC: 31.2 g/dL (ref 30.0–36.0)
MCV: 80 fL (ref 80.0–100.0)
Platelets: 336 10*3/uL (ref 150–400)
RBC: 5.05 MIL/uL (ref 3.87–5.11)
RDW: 14.4 % (ref 11.5–15.5)
WBC: 9.2 10*3/uL (ref 4.0–10.5)
nRBC: 0 % (ref 0.0–0.2)

## 2023-01-03 MED ORDER — IOHEXOL 300 MG/ML  SOLN
100.0000 mL | Freq: Once | INTRAMUSCULAR | Status: AC | PRN
  Administered 2023-01-03: 100 mL via INTRAVENOUS

## 2023-01-03 MED ORDER — ONDANSETRON 4 MG PO TBDP: 4.0000 mg | ORAL_TABLET | Freq: Three times a day (TID) | ORAL | 0 refills | Status: AC | PRN

## 2023-01-03 MED ORDER — SODIUM CHLORIDE 0.9 % IV BOLUS
1000.0000 mL | Freq: Once | INTRAVENOUS | Status: AC
  Administered 2023-01-03: 1000 mL via INTRAVENOUS

## 2023-01-03 MED ORDER — DICYCLOMINE HCL 10 MG PO CAPS
10.0000 mg | ORAL_CAPSULE | Freq: Once | ORAL | Status: AC
  Administered 2023-01-03: 10 mg via ORAL
  Filled 2023-01-03: qty 1

## 2023-01-03 MED ORDER — ONDANSETRON 4 MG PO TBDP
4.0000 mg | ORAL_TABLET | Freq: Once | ORAL | Status: AC
  Administered 2023-01-03: 4 mg via ORAL
  Filled 2023-01-03: qty 1

## 2023-01-03 MED ORDER — OXYCODONE-ACETAMINOPHEN 5-325 MG PO TABS
2.0000 | ORAL_TABLET | Freq: Once | ORAL | Status: AC
  Administered 2023-01-03: 2 via ORAL
  Filled 2023-01-03: qty 2

## 2023-01-03 MED ORDER — SODIUM CHLORIDE (PF) 0.9 % IJ SOLN
INTRAMUSCULAR | Status: AC
  Filled 2023-01-03: qty 50

## 2023-01-03 MED ORDER — ONDANSETRON HCL 4 MG/2ML IJ SOLN
4.0000 mg | Freq: Once | INTRAMUSCULAR | Status: AC
  Administered 2023-01-03: 4 mg via INTRAVENOUS
  Filled 2023-01-03: qty 2

## 2023-01-03 MED ORDER — DICYCLOMINE HCL 20 MG PO TABS: 20.0000 mg | ORAL_TABLET | Freq: Two times a day (BID) | ORAL | 0 refills | Status: AC

## 2023-01-03 MED ORDER — HYDROMORPHONE HCL 1 MG/ML IJ SOLN
1.0000 mg | Freq: Once | INTRAMUSCULAR | Status: AC
  Administered 2023-01-03: 1 mg via INTRAVENOUS
  Filled 2023-01-03: qty 1

## 2023-01-03 NOTE — ED Triage Notes (Addendum)
Pt c/o right sided groin/RLQ pain x 2 hours. Pt c/o nausea and vomiting, denies diarrhea. Pt is crying through triage, obviously in pain . No relief with tylenol at home.

## 2023-01-03 NOTE — ED Provider Notes (Signed)
[facility] AT [facility] Provider Note   CSN: [id number] Arrival date & time: 01/03/23  1028     History  Chief Complaint  Patient presents with   Abdominal Pain    [name] is a 37 y.o. female.  Patient with noncontributory past medical history presents today with complaints of RLQ abdominal pain. She states that same began suddenly 2 hours ago and has been persistent since then. She endorses associated nausea and vomiting without diarrhea. She is having normal bowel movements and passing flatus. Denies history of similar symptoms previously. Has had tubal ligation and c section previously, no other history of abdominal surgeries. LMP was 2 weeks ago and was normal for her. Denies hematochezia or melena. No known sick contacts. She does not smoke marijuana. No vaginal discharge or urinary symptoms.   The history is provided by the patient. No language interpreter was used.  Abdominal Pain Associated symptoms: nausea and vomiting        Home Medications Prior to Admission medications   Medication Sig Start Date End Date Taking? Authorizing Provider  ibuprofen (ADVIL) 800 MG tablet Take 1 tablet (800 mg total) by mouth 3 (three) times daily. 02/27/22   Peter Garter, PA  methylphenidate (RITALIN) 20 MG tablet Take 1.5 tablets (30 mg total) by mouth 2 (two) times daily with breakfast and lunch. 09/03/22 09/03/23  Oletta Darter, MD  methylphenidate (RITALIN) 20 MG tablet Take 1.5 tablets (30 mg total) by mouth 2 (two) times daily with breakfast and lunch. 09/03/22 09/03/23  Oletta Darter, MD  methylphenidate (RITALIN) 20 MG tablet Take 1.5 tablets (30 mg total) by mouth 2 (two) times daily with breakfast and lunch. 12/03/22 12/03/23  Oletta Darter, MD      Allergies    Patient has no known allergies.    Review of Systems   Review of Systems  Gastrointestinal:  Positive for abdominal pain, nausea and vomiting.  All other systems reviewed  and are negative.   Physical Exam Updated Vital Signs BP (!) 169/113 (BP Location: Right Arm)   Pulse 82   Temp 98 F (36.7 C) (Oral)   Resp (!) 22   Ht 5\' 6"  (1.676 m)   Wt 117.9 kg   SpO2 100%   BMI 41.97 kg/m  Physical Exam Vitals and nursing note reviewed.  Constitutional:      General: She is not in acute distress.    Appearance: Normal appearance. She is normal weight. She is not ill-appearing, toxic-appearing or diaphoretic.  HENT:     Head: Normocephalic and atraumatic.  Cardiovascular:     Rate and Rhythm: Normal rate.  Pulmonary:     Effort: Pulmonary effort is normal. No respiratory distress.  Abdominal:     General: Abdomen is flat.     Palpations: Abdomen is soft.     Tenderness: There is abdominal tenderness in the right lower quadrant.  Musculoskeletal:        General: Normal range of motion.     Cervical back: Normal range of motion.  Skin:    General: Skin is warm and dry.  Neurological:     General: No focal deficit present.     Mental Status: She is alert.  Psychiatric:        Mood and Affect: Mood normal.        Behavior: Behavior normal.     ED Results / Procedures / Treatments   Labs (all labs ordered are listed, but only abnormal  results are displayed) Labs Reviewed  COMPREHENSIVE METABOLIC PANEL - Abnormal; Notable for the following components:      Result Value   Glucose, Bld 139 (*)    All other components within normal limits  CBC - Abnormal; Notable for the following components:   MCH 25.0 (*)    All other components within normal limits  URINALYSIS, ROUTINE W REFLEX MICROSCOPIC - Abnormal; Notable for the following components:   APPearance HAZY (*)    Specific Gravity, Urine 1.040 (*)    Leukocytes,Ua TRACE (*)    All other components within normal limits  I-STAT CHEM 8, ED - Abnormal; Notable for the following components:   Glucose, Bld 107 (*)    All other components within normal limits  LIPASE, BLOOD  I-STAT BETA HCG  BLOOD, ED ([facility], [facility], [facility] ONLY)    EKG None  Radiology US Pelvis Complete  Result Date: 01/03/2023 CLINICAL DATA:  Right lower quadrant pain EXAM: TRANSABDOMINAL AND TRANSVAGINAL ULTRASOUND OF PELVIS DOPPLER ULTRASOUND OF OVARIES TECHNIQUE: Both transabdominal and transvaginal ultrasound examinations of the pelvis were performed. Transabdominal technique was performed for global imaging of the pelvis including uterus, ovaries, adnexal regions, and pelvic cul-de-sac. It was necessary to proceed with endovaginal exam following the transabdominal exam to visualize the ovaries. Color and duplex Doppler ultrasound was utilized to evaluate blood flow to the ovaries. COMPARISON:  CT abdomen and pelvis with contrast January 03, 2023 FINDINGS: Uterus Measurements: 10.8 x 5.5 x 8.0 cm = volume: 249.3 mL. There is a large calcified fibroid within the fundus, measuring approximately 6.9 x 6.6 x 6.4 cm. Endometrium Limited visualization secondary to large calcified fibroid. No focal abnormality visualized. Right ovary Not visualized, likely secondary to body habitus and overlying bowel gas. Left ovary Measurements: 4.3 x 2.1 x 1.6 cm = volume: 7.3 mL. Normal appearance/no adnexal mass. There is a benign simple cyst versus prominent follicle measuring 2.2 x 2.1 x 2.3 cm. No follow-up is recommended. Pulsed Doppler evaluation of both ovaries demonstrates normal low-resistance arterial and venous waveforms. Other findings No abnormal free fluid. IMPRESSION: 1. No acute findings. 2. Large calcified fibroid in the fundus. 3. Normal sonographic appearance of the left ovary. The right ovary is not clearly visualized, likely secondary to body habitus and overlying bowel gas. Electronically Signed   By: Jacob Moores M.D.   On: 01/03/2023 14:42   US Transvaginal Non-OB  Result Date: 01/03/2023 CLINICAL DATA:  Right lower quadrant pain EXAM: TRANSABDOMINAL AND TRANSVAGINAL ULTRASOUND OF PELVIS DOPPLER ULTRASOUND OF OVARIES  TECHNIQUE: Both transabdominal and transvaginal ultrasound examinations of the pelvis were performed. Transabdominal technique was performed for global imaging of the pelvis including uterus, ovaries, adnexal regions, and pelvic cul-de-sac. It was necessary to proceed with endovaginal exam following the transabdominal exam to visualize the ovaries. Color and duplex Doppler ultrasound was utilized to evaluate blood flow to the ovaries. COMPARISON:  CT abdomen and pelvis with contrast January 03, 2023 FINDINGS: Uterus Measurements: 10.8 x 5.5 x 8.0 cm = volume: 249.3 mL. There is a large calcified fibroid within the fundus, measuring approximately 6.9 x 6.6 x 6.4 cm. Endometrium Limited visualization secondary to large calcified fibroid. No focal abnormality visualized. Right ovary Not visualized, likely secondary to body habitus and overlying bowel gas. Left ovary Measurements: 4.3 x 2.1 x 1.6 cm = volume: 7.3 mL. Normal appearance/no adnexal mass. There is a benign simple cyst versus prominent follicle measuring 2.2 x 2.1 x 2.3 cm. No follow-up is recommended. Pulsed Doppler  evaluation of both ovaries demonstrates normal low-resistance arterial and venous waveforms. Other findings No abnormal free fluid. IMPRESSION: 1. No acute findings. 2. Large calcified fibroid in the fundus. 3. Normal sonographic appearance of the left ovary. The right ovary is not clearly visualized, likely secondary to body habitus and overlying bowel gas. Electronically Signed   By: Jacob Moores M.D.   On: 01/03/2023 14:42   [place] Art/Ven Flow Abd Pelv Doppler  Result Date: 01/03/2023 CLINICAL DATA:  Right lower quadrant pain EXAM: TRANSABDOMINAL AND TRANSVAGINAL ULTRASOUND OF PELVIS DOPPLER ULTRASOUND OF OVARIES TECHNIQUE: Both transabdominal and transvaginal ultrasound examinations of the pelvis were performed. Transabdominal technique was performed for global imaging of the pelvis including uterus, ovaries, adnexal regions, and pelvic  cul-de-sac. It was necessary to proceed with endovaginal exam following the transabdominal exam to visualize the ovaries. Color and duplex Doppler ultrasound was utilized to evaluate blood flow to the ovaries. COMPARISON:  CT abdomen and pelvis with contrast January 03, 2023 FINDINGS: Uterus Measurements: 10.8 x 5.5 x 8.0 cm = volume: 249.3 mL. There is a large calcified fibroid within the fundus, measuring approximately 6.9 x 6.6 x 6.4 cm. Endometrium Limited visualization secondary to large calcified fibroid. No focal abnormality visualized. Right ovary Not visualized, likely secondary to body habitus and overlying bowel gas. Left ovary Measurements: 4.3 x 2.1 x 1.6 cm = volume: 7.3 mL. Normal appearance/no adnexal mass. There is a benign simple cyst versus prominent follicle measuring 2.2 x 2.1 x 2.3 cm. No follow-up is recommended. Pulsed Doppler evaluation of both ovaries demonstrates normal low-resistance arterial and venous waveforms. Other findings No abnormal free fluid. IMPRESSION: 1. No acute findings. 2. Large calcified fibroid in the fundus. 3. Normal sonographic appearance of the left ovary. The right ovary is not clearly visualized, likely secondary to body habitus and overlying bowel gas. Electronically Signed   By: Jacob Moores M.D.   On: 01/03/2023 14:42   CT ABDOMEN PELVIS W CONTRAST  Result Date: 01/03/2023 CLINICAL DATA:  Right lower quadrant abdominal pain for the past 2 hours. EXAM: CT ABDOMEN AND PELVIS WITH CONTRAST TECHNIQUE: Multidetector CT imaging of the abdomen and pelvis was performed using the standard protocol following bolus administration of intravenous contrast. RADIATION DOSE REDUCTION: This exam was performed according to the departmental dose-optimization program which includes automated exposure control, adjustment of the mA and/or kV according to patient size and/or use of iterative reconstruction technique. CONTRAST:  OMNIPAQUE IOHEXOL 300 MG/ML  SOLN COMPARISON:   CT abdomen pelvis dated November 10, 2020. FINDINGS: Lower chest: No acute abnormality. Hepatobiliary: No focal liver abnormality is seen. Status post cholecystectomy. No biliary dilatation. Pancreas: Unremarkable. No pancreatic ductal dilatation or surrounding inflammatory changes. Spleen: Normal in size without focal abnormality. Adrenals/Urinary Tract: Adrenal glands are unremarkable. Kidneys are normal, without renal calculi, focal lesion, or hydronephrosis. Bladder is unremarkable. Stomach/Bowel: Stomach is within normal limits. Appendix appears normal. No evidence of bowel wall thickening, distention, or inflammatory changes. Vascular/Lymphatic: No significant vascular findings are present. No enlarged abdominal or pelvic lymph nodes. Reproductive: Previously described degenerating fibroid in the right uterine body/fundus currently measures 7.2 x 6.9 cm, previously 9.2 x 8.5 cm, and now demonstrates rim calcification. No adnexal mass. Prior tubal ligation. Other: Unchanged diastasis of the midline ventral abdominal wall. No free fluid or pneumoperitoneum. Musculoskeletal: No acute or significant osseous findings. IMPRESSION: 1. No acute intra-abdominal process. Normal appendix. 2. Mild interval decrease in size of the large degenerating fibroid in the right uterus. Electronically  Signed   By: Obie Dredge M.D.   On: 01/03/2023 12:48    Procedures Procedures    Medications Ordered in ED Medications  iohexol (OMNIPAQUE) 300 MG/ML solution 100 mL (has no administration in time range)  sodium chloride 0.9 % bolus 1,000 mL (1,000 mLs Intravenous New Bag/Given 01/03/23 1139)  ondansetron (ZOFRAN) injection 4 mg (4 mg Intravenous Given 01/03/23 1136)  oxyCODONE-acetaminophen (PERCOCET/ROXICET) 5-325 MG per tablet 2 tablet (2 tablets Oral Given 01/03/23 1136)  ondansetron (ZOFRAN-ODT) disintegrating tablet 4 mg (4 mg Oral Given 01/03/23 1136)    ED Course/ Medical Decision Making/ A&P                              Medical Decision Making Amount and/or Complexity of Data Reviewed Labs: ordered. Radiology: ordered.  Risk Prescription drug management.   This patient is a 37 y.o. female who presents to the ED for concern of abdominal pain, this involves an extensive number of treatment options, and is a complaint that carries with it a high risk of complications and morbidity. The emergent differential diagnosis prior to evaluation includes, but is not limited to,  gastroenteritis, appendicitis, Bowel obstruction, Bowel perforation. Gastroparesis, DKA, Hernia, Inflammatory bowel disease, mesenteric ischemia, pancreatitis, peritonitis SBP, volvulus.  This is not an exhaustive differential.   Past Medical History / Co-morbidities / Social History: Hx tubal ligation and c section  Physical Exam: Physical exam performed. The pertinent findings include: RLQ TTP  Lab Tests: I ordered, and personally interpreted labs.  The pertinent results include:  no acute laboratory findings   Imaging Studies: I ordered imaging studies including CT abdomen pelvis and pelvic US. I independently visualized and interpreted imaging which showed   CT: 1. No acute intra-abdominal process. Normal appendix. 2. Mild interval decrease in size of the large degenerating fibroid in the right uterus.  Pelvic US:   1. No acute findings. 2. Large calcified fibroid in the fundus. 3. Normal sonographic appearance of the left ovary. The right ovary is not clearly visualized, likely secondary to body habitus and overlying bowel gas.  I agree with the radiologist interpretation.    Medications: I ordered medication including zofran, percocet, dilaudid, bentyl, fluids  for nausea, vomiting, and abdominal pain. Reevaluation of the patient after these medicines showed that the patient improved. I have reviewed the patients home medicines and have made adjustments as needed.   Disposition:  Patient presents today  with complaints of right lower quadrant abdominal pain.  She is afebrile, nontoxic-appearing, and in no acute distress with reassuring vital signs. Patient is nontoxic, nonseptic appearing, in no apparent distress.  Patient's pain and other symptoms adequately managed in emergency department.  Fluid bolus given.  Labs, imaging and vitals reviewed.  Patient does not meet the SIRS or Sepsis criteria.  On repeat exam patient does not have a surgical abdomin and there are no peritoneal signs.  No indication of appendicitis, bowel obstruction, bowel perforation, cholecystitis, diverticulitis, PID or ectopic pregnancy.  Pain likely due to patient's degenerating uterine fibroid that is present on the right side of her uterus.  Discussed with patient who is understanding and agreement.  Recommend closed OB/GYN follow-up to discuss potential surgical management of this.  After medication management, her pain is significantly improved and she is able to eat and drink without any subsequent episodes of nausea or vomiting.  Patient discharged home with Zofran and Bentyl for symptomatic treatment and given strict  instructions for follow-up with their primary care physician.  I have also discussed reasons to return immediately to the ER.  Patient expresses understanding and agrees with plan.   Final Clinical Impression(s) / ED Diagnoses Final diagnoses:  RLQ abdominal pain  Degeneration of uterine fibroid    Rx / DC Orders ED Discharge Orders          Ordered    dicyclomine (BENTYL) 20 MG tablet  2 times daily        01/03/23 1548    ondansetron (ZOFRAN-ODT) 4 MG disintegrating tablet  Every 8 hours PRN        01/03/23 1548          An After Visit Summary was printed and given to the patient.     Vear Clock 01/03/23 1548    Mardene Sayer, MD 01/03/23 [phone]

## 2023-01-03 NOTE — Discharge Instructions (Addendum)
As we discussed, your workup in the ER today was reassuring for acute findings. CT imaging and US imaging was unremarkable for emergent concerns. I suspect your pain is due to your uterine fibroid that appears to have outgrown its blood supply and is degenerating. I recommend you call your OB/GYN for follow-up to discuss removal if your pain continues. In the interim, I have given you a prescription for bentyl which is to help with stomach cramps and Zofran to help with nausea and vomiting.  Please take these as prescribed as needed.  Return if development of any new or worsening symptoms.

## 2023-01-03 NOTE — ED Notes (Signed)
Pt transferred to CT by stretcher

## 2023-01-07 ENCOUNTER — Ambulatory Visit (INDEPENDENT_AMBULATORY_CARE_PROVIDER_SITE_OTHER): Payer: BC Managed Care – PPO | Admitting: Psychology

## 2023-01-07 DIAGNOSIS — F411 Generalized anxiety disorder: Secondary | ICD-10-CM

## 2023-01-07 DIAGNOSIS — F321 Major depressive disorder, single episode, moderate: Secondary | ICD-10-CM | POA: Diagnosis not present

## 2023-01-07 DIAGNOSIS — F900 Attention-deficit hyperactivity disorder, predominantly inattentive type: Secondary | ICD-10-CM | POA: Diagnosis not present

## 2023-01-07 NOTE — Progress Notes (Addendum)
[facility] Counselor/Therapist Progress Note  Patient ID: [name], MRN: [id number]   Date: 01/07/23  Time Spent: 9:03  am - 9:50 am : 47 Minutes  Treatment Type: Individual Therapy.  Reported Symptoms: depression, anxiety, and ADHD.   Mental Status Exam: Appearance:  Neat and Well Groomed     Behavior: Appropriate  Motor: Normal  Speech/Language:  Clear and Coherent  Affect: Congruent  Mood: normal  Thought process: normal  Thought content:   WNL  Sensory/Perceptual disturbances:   WNL  Orientation: oriented to person, place, time/date, and situation  Attention: Good  Concentration: Good  Memory: WNL  Fund of knowledge:  Good  Insight:   Good  Judgment:  Good  Impulse Control: Good   Risk Assessment: Danger to Self:  No Self-injurious Behavior: No Danger to Others: No Duty to Warn:no Physical Aggression / Violence:No  Access to Firearms a concern: No  Gang Involvement:No   In case of a mental health emergency:  34 - confidential suicide hotline. [facility]        [address], [place] [address]       [phone] 3.   911  4.   Visiting Nearest ED.    Subjective:   [name] participated from home, via video and consented to treatment. Therapist participated from home office. We met online due to COVID pandemic. Ova reviewed the events of the past week. [name] noted recently experiencing significant pain which necessitated a visit to the ER. She noted the possibility of a need for a partial hysterectomy. She identified negative feelings associated to this possibility. She noted anxiety regarding this due to her husband healing from his surgery and how they would navigate both adults being out of commission. She noted her avoidance of contacting her OBGYN to get a consult. We explored this avoidance and identifying the pros and cons of this. She noted many positives during this time as well  including being on track for graduation. She noted engagement in enjoyable activities. She noted difficulty getting her adhd medication but noted this issue being resolved. Therapist encouraged continued efforts towards self-care including exercise and boundaries for children. Therapist praised [name] for her effort between sessions and provided supportive therapy.   Interventions: Psycho-education & Goal Setting.   Diagnosis:  MDD (major depressive disorder), single episode, moderate  GAD (generalized anxiety disorder)  ADHD, predominantly inattentive type  Psychiatric Treatment: Yes , Dr. Bayard Males MD. Please see chart.    Treatment Plan:  Client Abilities/Strengths [name] is intelligent, self-aware, and motivated for change.   Support System: Family and Friends.   Client Treatment Preferences OPT  Client Statement of Needs [name] would like to improve coping, manage day-to-day stressors, improve frustration tolerance, improve mindfulness, increase consistency with exercise and self-care, increasing time to self, manage negative self-talk, staying organized and on task,  think more positively.    Treatment Level Weekly  Symptoms  GAD: Anxious, difficulty managing worry, worrying about different things, trouble relaxing, irritability.    (Status: maintained) Depression: Feeling bad about self, negative self-talk, feelings of guilt.    (Status: maintained)  Goals:   [name] experiences symptoms of depression, anxiety, and ADHD.   Target Date: 12/11/23 Frequency: Weekly  Progress: 0 Modality: individual    Therapist will provide referrals for additional resources as appropriate.  Therapist will provide psycho-education regarding [name]'s diagnosis and corresponding treatment approaches and interventions. Licensed Clinical Social Worker, [place], LCSW will support the  patient's ability to achieve the goals identified. will employ CBT, BA, Problem-solving,  Solution Focused, Mindfulness,  coping skills, & other evidenced-based practices will be used to promote progress towards healthy functioning to help manage decrease symptoms associated with her diagnosis.   Reduce overall level, frequency, and intensity of the feelings of depression and anxiety as evidenced by decreased overall symptoms from 6 to 7 days/week to 0 to 1 days/week per client report for at least 3 consecutive months. Verbally express understanding of the relationship between feelings of depression, anxiety and their impact on thinking patterns and behaviors. Verbalize an understanding of the role that distorted thinking plays in creating fears, excessive worry, and ruminations.    [name] participated in the creation of the treatment plan)    Delight Ovens, LCSW

## 2023-01-21 ENCOUNTER — Ambulatory Visit (INDEPENDENT_AMBULATORY_CARE_PROVIDER_SITE_OTHER): Payer: BC Managed Care – PPO | Admitting: Psychology

## 2023-01-21 DIAGNOSIS — F411 Generalized anxiety disorder: Secondary | ICD-10-CM

## 2023-01-21 DIAGNOSIS — F900 Attention-deficit hyperactivity disorder, predominantly inattentive type: Secondary | ICD-10-CM | POA: Diagnosis not present

## 2023-01-21 DIAGNOSIS — F321 Major depressive disorder, single episode, moderate: Secondary | ICD-10-CM

## 2023-01-21 NOTE — Progress Notes (Signed)
[facility] Counselor/Therapist Progress Note  Patient ID: [name], MRN: [id number]   Date: 01/21/23  Time Spent: 10:00  am - 10:51 am :51 Minutes  Treatment Type: Individual Therapy.  Reported Symptoms: depression, anxiety, and ADHD.   Mental Status Exam: Appearance:  Neat and Well Groomed     Behavior: Appropriate  Motor: Normal  Speech/Language:  Clear and Coherent  Affect: Congruent  Mood: normal  Thought process: normal  Thought content:   WNL  Sensory/Perceptual disturbances:   WNL  Orientation: oriented to person, place, time/date, and situation  Attention: Good  Concentration: Good  Memory: WNL  Fund of knowledge:  Good  Insight:   Good  Judgment:  Good  Impulse Control: Good   Risk Assessment: Danger to Self:  No Self-injurious Behavior: No Danger to Others: No Duty to Warn:no Physical Aggression / Violence:No  Access to Firearms a concern: No  Gang Involvement:No   In case of a mental health emergency:  32 - confidential suicide hotline. [facility]):        [address], [place] [address]       [phone] 3.   911  4.   Visiting Nearest ED.    Subjective:   [name] participated from home, via video and consented to treatment. Therapist participated from home office. We met online due to COVID pandemic. [name] reviewed the events of the past week. [name] note stress regarding her daughter's ([name]) behavior at school and noted recent behavior that could have resulted in a suspension. We explored this during the session and processed this during the session. We worked on identifying her attempts to manage this and discussed additional interventions to address this behavior. She noted her own worry regarding her daughter's behavior and the possibility of of her daughter being labeled as a result. We worked on identifying ways to manage her worry and stress and discussed ways to manage  this via self-talk, setting reasonable expectations, and taking breaks. She noted, at times, unreasonable expectations for herself and others at times. She noted her goals for exercise of 5 days week and the negative self-talk that she experiences when she does not meet the goal. We worked on setting reasonable expectations and managing negative self-talk. Therapist encouraged continued efforts towards self-care including exercise and boundaries for children. Therapist praised [name] for her effort between sessions and provided supportive therapy.   Interventions: Psycho-education & Goal Setting.   Diagnosis:  MDD (major depressive disorder), single episode, moderate (HCC)  GAD (generalized anxiety disorder)  ADHD, predominantly inattentive type  Psychiatric Treatment: Yes , Dr. Bayard Males MD. Please see chart.    Treatment Plan:  Client Abilities/Strengths [name] is intelligent, self-aware, and motivated for change.   Support System: Family and Friends.   Client Treatment Preferences OPT  Client Statement of Needs [name] would like to improve coping, manage day-to-day stressors, improve frustration tolerance, improve mindfulness, increase consistency with exercise and self-care, increasing time to self, manage negative self-talk, staying organized and on task,  think more positively.    Treatment Level Weekly  Symptoms  GAD: Anxious, difficulty managing worry, worrying about different things, trouble relaxing, irritability.    (Status: maintained) Depression: Feeling bad about self, negative self-talk, feelings of guilt.    (Status: maintained)  Goals:   [name] experiences symptoms of depression, anxiety, and ADHD.   Target Date: 12/11/23 Frequency: Weekly  Progress: 0 Modality: individual    Therapist will provide referrals for  additional resources as appropriate.  Therapist will provide psycho-education regarding [name]'s diagnosis and corresponding treatment  approaches and interventions. Licensed Clinical Social Worker, [place], LCSW will support the patient's ability to achieve the goals identified. will employ CBT, BA, Problem-solving, Solution Focused, Mindfulness,  coping skills, & other evidenced-based practices will be used to promote progress towards healthy functioning to help manage decrease symptoms associated with her diagnosis.   Reduce overall level, frequency, and intensity of the feelings of depression and anxiety as evidenced by decreased overall symptoms from 6 to 7 days/week to 0 to 1 days/week per client report for at least 3 consecutive months. Verbally express understanding of the relationship between feelings of depression, anxiety and their impact on thinking patterns and behaviors. Verbalize an understanding of the role that distorted thinking plays in creating fears, excessive worry, and ruminations.    [name] participated in the creation of the treatment plan)    Mary Ovens, LCSW

## 2023-01-25 ENCOUNTER — Encounter (HOSPITAL_COMMUNITY): Payer: Self-pay

## 2023-02-10 ENCOUNTER — Ambulatory Visit (INDEPENDENT_AMBULATORY_CARE_PROVIDER_SITE_OTHER): Payer: BC Managed Care – PPO | Admitting: Psychology

## 2023-02-10 DIAGNOSIS — F321 Major depressive disorder, single episode, moderate: Secondary | ICD-10-CM

## 2023-02-10 DIAGNOSIS — F900 Attention-deficit hyperactivity disorder, predominantly inattentive type: Secondary | ICD-10-CM

## 2023-02-10 DIAGNOSIS — F411 Generalized anxiety disorder: Secondary | ICD-10-CM | POA: Diagnosis not present

## 2023-02-10 NOTE — Progress Notes (Signed)
[facility] Counselor/Therapist Progress Note  Patient ID: [name], MRN: [id number]   Date: 02/10/23  Time Spent: 11:00  am - 11:42 am :42 Minutes  Treatment Type: Individual Therapy.  Reported Symptoms: depression, anxiety, and ADHD.   Mental Status Exam: Appearance:  Neat and Well Groomed     Behavior: Appropriate  Motor: Normal  Speech/Language:  Clear and Coherent  Affect: Congruent  Mood: normal  Thought process: normal  Thought content:   WNL  Sensory/Perceptual disturbances:   WNL  Orientation: oriented to person, place, time/date, and situation  Attention: Good  Concentration: Good  Memory: WNL  Fund of knowledge:  Good  Insight:   Good  Judgment:  Good  Impulse Control: Good   Risk Assessment: Danger to Self:  No Self-injurious Behavior: No Danger to Others: No Duty to Warn:no Physical Aggression / Violence:No  Access to Firearms a concern: No  Gang Involvement:No   In case of a mental health emergency:  65 - confidential suicide hotline. [facility]):        [address], [place] [address]       [phone] 3.   911  4.   Visiting Nearest ED.    Subjective:   [name] participated from home, via video and consented to treatment. Therapist participated from home office. We met online due to COVID pandemic. [name] reviewed the events of the past week. She noted being nervous about re-entering the workforce after being out of work for ~6 years. She noted having double including "do I really know what I know?". She noted being slated to go back into the workforce in middle of 2025. She noted the stressors include the interview process, having to present during an interview, location of a perspective job, and change in her routine. We worked on exploring other related concerns. Therapist encouraged [name] to create a list of tasks and concerns she has and arrange them in a timeline that  matches her goals. Therapist validated [name]'s feeling and experience and normalized experiencing anxiety when pursuing employment. She noted interpersonal stressors, with her neighbor, due to feral cats and noted having to deal with this issue. We explored this during the session and her feelings of sadness. Therapist encouraged [name] to work towards goal between sessions and to problem solve. Therapist validated and normalized her experience and provided supportive therapy.   Interventions: Psycho-education & Goal Setting.   Diagnosis:  MDD (major depressive disorder), single episode, moderate (HCC)  GAD (generalized anxiety disorder)  ADHD, predominantly inattentive type  Psychiatric Treatment: Yes , Dr. Bayard Males MD. Please see chart.    Treatment Plan:  Client Abilities/Strengths [name] is intelligent, self-aware, and motivated for change.   Support System: Family and Friends.   Client Treatment Preferences OPT  Client Statement of Needs [name] would like to improve coping, manage day-to-day stressors, improve frustration tolerance, improve mindfulness, increase consistency with exercise and self-care, increasing time to self, manage negative self-talk, staying organized and on task,  think more positively.    Treatment Level Weekly  Symptoms  GAD: Anxious, difficulty managing worry, worrying about different things, trouble relaxing, irritability.    (Status: maintained) Depression: Feeling bad about self, negative self-talk, feelings of guilt.    (Status: maintained)  Goals:   [name] experiences symptoms of depression, anxiety, and ADHD.   Target Date: 12/11/23 Frequency: Weekly  Progress: 0 Modality: individual    Therapist will provide referrals for additional resources as appropriate.  Therapist will provide psycho-education regarding Karmina's diagnosis and corresponding treatment approaches and interventions. Licensed Clinical Social Worker, [place], LCSW will support the patient's ability to achieve the goals identified. will employ CBT, BA, Problem-solving, Solution Focused, Mindfulness,  coping skills, & other evidenced-based practices will be used to promote progress towards healthy functioning to help manage decrease symptoms associated with her diagnosis.   Reduce overall level, frequency, and intensity of the feelings of depression and anxiety as evidenced by decreased overall symptoms from 6 to 7 days/week to 0 to 1 days/week per client report for at least 3 consecutive months. Verbally express understanding of the relationship between feelings of depression, anxiety and their impact on thinking patterns and behaviors. Verbalize an understanding of the role that distorted thinking plays in creating fears, excessive worry, and ruminations.    [name] participated in the creation of the treatment plan)    Delight Ovens, LCSW

## 2023-03-01 ENCOUNTER — Ambulatory Visit (INDEPENDENT_AMBULATORY_CARE_PROVIDER_SITE_OTHER): Payer: BC Managed Care – PPO | Admitting: Psychology

## 2023-03-01 DIAGNOSIS — F411 Generalized anxiety disorder: Secondary | ICD-10-CM

## 2023-03-01 DIAGNOSIS — F321 Major depressive disorder, single episode, moderate: Secondary | ICD-10-CM | POA: Diagnosis not present

## 2023-03-01 DIAGNOSIS — F900 Attention-deficit hyperactivity disorder, predominantly inattentive type: Secondary | ICD-10-CM | POA: Diagnosis not present

## 2023-03-01 NOTE — Progress Notes (Signed)
[facility] Counselor/Therapist Progress Note  Patient ID: [name], MRN: [id number]   Date: 03/01/23  Time Spent: 5:03  pm - 5:48 pm : 45 Minutes  Treatment Type: Individual Therapy.  Reported Symptoms: depression, anxiety, and ADHD.   Mental Status Exam: Appearance:  Neat and Well Groomed     Behavior: Appropriate  Motor: Normal  Speech/Language:  Clear and Coherent  Affect: Congruent  Mood: normal  Thought process: normal  Thought content:   WNL  Sensory/Perceptual disturbances:   WNL  Orientation: oriented to person, place, time/date, and situation  Attention: Good  Concentration: Good  Memory: WNL  Fund of knowledge:  Good  Insight:   Good  Judgment:  Good  Impulse Control: Good   Risk Assessment: Danger to Self:  No Self-injurious Behavior: No Danger to Others: No Duty to Warn:no Physical Aggression / Violence:No  Access to Firearms a concern: No  Gang Involvement:No   In case of a mental health emergency:  19 - confidential suicide hotline. [facility]):        [address], [place] [address]       [phone] 3.   911  4.   Visiting Nearest ED.    Subjective:   [name] participated from home, via video and consented to treatment. Therapist participated from home office. We met online due to COVID pandemic. [name] reviewed the events of the past week. She noted feeling nervous about the job market and noted being slated to apply in early 2025. She noted struggling with her attention on specific tasks and we worked on identifying ways to address this during the session. She noted difficulty with maintaining her exercise routine and noted needing to "feel it" to exercise. She noted over-thinking being a barrier to engagement. She noted her self-talk being a barrier and discussed her workouts, at time, feel like a waste of time. We explored this during the session and ways to manage this  going forward including positive self-talk, reframing, and challenging negative self-talk. Therapist modeled this during the session. Therapist encouraged [name] to work towards employing these tools between sessions. Therapist validated and normalized her experience and provided supportive therapy.   Interventions: CBT *& BA  Diagnosis:  MDD (major depressive disorder), single episode, moderate (HCC)  GAD (generalized anxiety disorder)  ADHD, predominantly inattentive type  Psychiatric Treatment: Yes , Dr. Bayard Males MD. Please see chart.    Treatment Plan:  Client Abilities/Strengths [name] is intelligent, self-aware, and motivated for change.   Support System: Family and Friends.   Client Treatment Preferences OPT  Client Statement of Needs [name] would like to improve coping, manage day-to-day stressors, improve frustration tolerance, improve mindfulness, increase consistency with exercise and self-care, increasing time to self, manage negative self-talk, staying organized and on task,  think more positively.    Treatment Level Weekly  Symptoms  GAD: Anxious, difficulty managing worry, worrying about different things, trouble relaxing, irritability.    (Status: maintained) Depression: Feeling bad about self, negative self-talk, feelings of guilt.    (Status: maintained)  Goals:   [name] experiences symptoms of depression, anxiety, and ADHD.   Target Date: 12/11/23 Frequency: Weekly  Progress: 0 Modality: individual    Therapist will provide referrals for additional resources as appropriate.  Therapist will provide psycho-education regarding [name]'s diagnosis and corresponding treatment approaches and interventions. Licensed Clinical Social Worker, [place], LCSW will support the patient's ability to achieve the goals identified. will employ CBT,  BA, Problem-solving, Solution Focused, Mindfulness,  coping skills, & other evidenced-based practices will be  used to promote progress towards healthy functioning to help manage decrease symptoms associated with her diagnosis.   Reduce overall level, frequency, and intensity of the feelings of depression and anxiety as evidenced by decreased overall symptoms from 6 to 7 days/week to 0 to 1 days/week per client report for at least 3 consecutive months. Verbally express understanding of the relationship between feelings of depression, anxiety and their impact on thinking patterns and behaviors. Verbalize an understanding of the role that distorted thinking plays in creating fears, excessive worry, and ruminations.    [name] participated in the creation of the treatment plan)    Delight Ovens, LCSW

## 2023-03-18 ENCOUNTER — Telehealth (HOSPITAL_BASED_OUTPATIENT_CLINIC_OR_DEPARTMENT_OTHER): Payer: BC Managed Care – PPO | Admitting: Psychiatry

## 2023-03-18 DIAGNOSIS — F900 Attention-deficit hyperactivity disorder, predominantly inattentive type: Secondary | ICD-10-CM

## 2023-03-18 MED ORDER — METHYLPHENIDATE HCL 20 MG PO TABS
30.0000 mg | ORAL_TABLET | Freq: Two times a day (BID) | ORAL | 0 refills | Status: AC
Start: 2023-03-18 — End: 2024-03-17

## 2023-03-18 MED ORDER — METHYLPHENIDATE HCL 20 MG PO TABS
30.0000 mg | ORAL_TABLET | Freq: Two times a day (BID) | ORAL | 0 refills | Status: DC
Start: 2023-03-18 — End: 2023-05-31

## 2023-03-18 NOTE — Progress Notes (Signed)
Virtual Visit via Video Note  I connected with [name] on 03/18/23 at 11:00 AM EDT by a video enabled telemedicine application and verified that I am speaking with the correct person using two identifiers.  Location: Patient: home Provider: office   I discussed the limitations of evaluation and management by telemedicine and the availability of in person appointments. The patient expressed understanding and agreed to proceed.  History of Present Illness: [place] is doing. They are going to visit her dad next week at the beach. Her sleep and energy are good. Her appetite is increased and she has gained a few pounds. Her depression is stable. She is working with her therapist. It was around her menstrual cycle. She has noticed the depression is strongest 2-3 days before her period starts. During that time she has no energy or motivation and has anhedonia. She denies SI/HI. Outside of this time her mood is great. The Ritalin has been very effective for her controlling her ADHD. Each tablet lasts for about 4 hrs. While on it she is motivated, focused and productive. On days she does not take it she has trouble focusing on one task at a time and is easily distracted. She denies SE from Ritalin.      Observations/Objective: Psychiatric Specialty Exam: ROS  unknown if currently breastfeeding.There is no height or weight on file to calculate BMI.  General Appearance: Neat and Well Groomed  Eye Contact:  Good  Speech:  Clear and Coherent and Normal Rate  Volume:  Normal  Mood:  Euthymic  Affect:  Full Range  Thought Process:  Goal Directed, Linear, and Descriptions of Associations: Intact  Orientation:  Full (Time, Place, and Person)  Thought Content:  Logical  Suicidal Thoughts:  No  Homicidal Thoughts:  No  Memory:  Immediate;   Good  Judgement:  Good  Insight:  Good  Psychomotor Activity:  Normal  Concentration:  Concentration: Good  Recall:  Good  Fund of Knowledge:  Good  Language:   Good  Akathisia:  No  Handed:  Right  AIMS (if indicated):     Assets:  Communication Skills Desire for Improvement Financial Resources/Insurance Housing Intimacy Leisure Time Resilience Social Support Talents/Skills Transportation Vocational/Educational  ADL's:  Intact  Cognition:  WNL  Sleep:        Assessment and Plan:     03/18/2023   10:58 AM 11/13/2022   10:42 AM 06/25/2022    9:27 AM 05/15/2022    8:40 AM 04/04/2022    9:41 AM  Depression screen PHQ 2/9  Decreased Interest 1 0 1 1 1   Down, Depressed, Hopeless 1 0 1 1 1   PHQ - 2 Score 2 0 2 2 2   Altered sleeping 0 0 1 1 2   Tired, decreased energy 0 0 3 0 3  Change in appetite 1 0 3 1 3   Feeling bad or failure about yourself  1 1 3 1 1   Trouble concentrating 0 0 3 1 0  Moving slowly or fidgety/restless  0 3 0 0  Suicidal thoughts 0 0 0 0 1  PHQ-9 Score 4 1 18 6 12   Difficult doing work/chores Very difficult  Somewhat difficult Somewhat difficult Very difficult    Flowsheet Row Video Visit from [facility]/20[facility] from 01/03/2023 in [facility] Department at [facility] Office Visit from 04/04/2022 in [facility]  C-SSRS RISK CATEGORY No Risk No Risk Error: Q3, 4, or 5  should not be populated when Q2 is No          Pt is aware that these meds carry a teratogenic risk. Pt will discuss plan of action if she does or plans to become pregnant in the future.  Status of current problems: stable ADHD   Medication management with supportive therapy. Risks and benefits, side effects and alternative treatment options discussed with patient. Pt was given an opportunity to ask questions about medication, illness, and treatment. All current psychiatric medications have been reviewed and discussed with the patient and adjusted as clinically appropriate.  Pt verbalized understanding and verbal consent obtained for  treatment.  Meds:  1. ADHD, predominantly inattentive type - methylphenidate (RITALIN) 20 MG tablet; Take 1.5 tablets (30 mg total) by mouth 2 (two) times daily with breakfast and lunch.  Dispense: 90 tablet; Refill: 0 - methylphenidate (RITALIN) 20 MG tablet; Take 1.5 tablets (30 mg total) by mouth 2 (two) times daily with breakfast and lunch.  Dispense: 90 tablet; Refill: 0 - methylphenidate (RITALIN) 20 MG tablet; Take 1.5 tablets (30 mg total) by mouth 2 (two) times daily with breakfast and lunch.  Dispense: 90 tablet; Refill: 0  Kaedynce has not been checking her BP and it was mildly elevated 4 weeks ago. I encouraged to check it more often and talk to her PCP about it.    Labs: none    Therapy: brief supportive therapy provided. Discussed psychosocial stressors in detail.     Collaboration of Care: Other none today  Patient/Guardian was advised Release of Information must be obtained prior to any record release in order to collaborate their care with an outside provider. Patient/Guardian was advised if they have not already done so to contact the registration department to sign all necessary forms in order for [place] to release information regarding their care.   Consent: Patient/Guardian gives verbal consent for treatment and assignment of benefits for services provided during this visit. Patient/Guardian expressed understanding and agreed to proceed.     Follow Up Instructions: Follow up in 3 months or sooner if needed with a new psychiatrist  Patient informed that I am leaving [facility] in 03/2023 and I relayed that they will be getting a new provider after that. Patient verbalized understanding and agreed with the plan.     I discussed the assessment and treatment plan with the patient. The patient was provided an opportunity to ask questions and all were answered. The patient agreed with the plan and demonstrated an understanding of the instructions.   The patient was advised to call  back or seek an in-person evaluation if the symptoms worsen or if the condition fails to improve as anticipated.  I provided 9 minutes of non-face-to-face time during this encounter.   Oletta Darter, MD

## 2023-03-26 ENCOUNTER — Ambulatory Visit: Payer: BC Managed Care – PPO | Admitting: Psychology

## 2023-04-09 ENCOUNTER — Ambulatory Visit: Payer: BC Managed Care – PPO | Admitting: Psychology

## 2023-04-12 ENCOUNTER — Ambulatory Visit (INDEPENDENT_AMBULATORY_CARE_PROVIDER_SITE_OTHER): Payer: BC Managed Care – PPO | Admitting: Psychology

## 2023-04-12 DIAGNOSIS — F900 Attention-deficit hyperactivity disorder, predominantly inattentive type: Secondary | ICD-10-CM | POA: Diagnosis not present

## 2023-04-12 DIAGNOSIS — F411 Generalized anxiety disorder: Secondary | ICD-10-CM

## 2023-04-12 DIAGNOSIS — F321 Major depressive disorder, single episode, moderate: Secondary | ICD-10-CM

## 2023-04-12 NOTE — Progress Notes (Signed)
[facility] Counselor/Therapist Progress Note  Patient ID: [name], MRN: [id number]   Date: 04/12/23  Time Spent: 10:02 am - 10:57 am : 55 Minutes  Treatment Type: Individual Therapy.  Reported Symptoms: depression, anxiety, and ADHD.   Mental Status Exam: Appearance:  Neat and Well Groomed     Behavior: Appropriate  Motor: Normal  Speech/Language:  Clear and Coherent  Affect: Congruent  Mood: normal  Thought process: normal  Thought content:   WNL  Sensory/Perceptual disturbances:   WNL  Orientation: oriented to person, place, time/date, and situation  Attention: Good  Concentration: Good  Memory: WNL  Fund of knowledge:  Good  Insight:   Good  Judgment:  Good  Impulse Control: Good   Risk Assessment: Danger to Self:  No Self-injurious Behavior: No Danger to Others: No Duty to Warn:no Physical Aggression / Violence:No  Access to Firearms a concern: No  Gang Involvement:No   In case of a mental health emergency:  81 - confidential suicide hotline. [facility]):        [address], [place] [address]       [phone] 3.   911  4.   Visiting Nearest ED.    Subjective:   [name] participated from home, via video, is aware of tele-session limitations, and consented to treatment. Therapist participated from office. [name] reviewed the events of the past week. [name] noted worry regarding her health after a recent discussion with her family regarding health. She noted having a family that experiences back issues. She noted this increasing her motivation to begin exercising and noted feeling better physically and emotionally while walking 3x per week. She noted upset stomach with her ADHD medication in the past three weeks and could not identify a cause for this. She noted continued anxiety about entering the work force next summer and noted worry that she won't do well or doesn't know enough. She  noted worry that she would "mess up something". We worked on identifying the evidence for this and identifying possible data for this, going forward. We worked on managing her anxiety via developing worry-time and psycho-education was provided. The handout was provided via email for reference and review. Therapist modeled intervention during the session. [name] was engaged and motivated during the session and committed towards her goals. Therapist provided supportive therapy.   Interventions: CBT   Diagnosis:  GAD (generalized anxiety disorder)  ADHD, predominantly inattentive type  MDD (major depressive disorder), single episode, moderate (HCC)  Psychiatric Treatment: Yes , Dr. Bayard Males MD. Please see chart.    Treatment Plan:  Client Abilities/Strengths [name] is intelligent, self-aware, and motivated for change.   Support System: Family and Friends.   Client Treatment Preferences OPT  Client Statement of Needs [name] would like to improve coping, manage day-to-day stressors, improve frustration tolerance, improve mindfulness, increase consistency with exercise and self-care, increasing time to self, manage negative self-talk, staying organized and on task,  think more positively.    Treatment Level Weekly  Symptoms  GAD: Anxious, difficulty managing worry, worrying about different things, trouble relaxing, irritability.    (Status: maintained) Depression: Feeling bad about self, negative self-talk, feelings of guilt.    (Status: maintained)  Goals:   Latroya experiences symptoms of depression, anxiety, and ADHD.   Target Date: 12/11/23 Frequency: Weekly  Progress: 0 Modality: individual    Therapist will provide referrals for additional resources as appropriate.  Therapist will provide psycho-education regarding [name]'s diagnosis  and corresponding treatment approaches and interventions. Licensed Clinical Social Worker, [place], LCSW will support the  patient's ability to achieve the goals identified. will employ CBT, BA, Problem-solving, Solution Focused, Mindfulness,  coping skills, & other evidenced-based practices will be used to promote progress towards healthy functioning to help manage decrease symptoms associated with her diagnosis.   Reduce overall level, frequency, and intensity of the feelings of depression and anxiety as evidenced by decreased overall symptoms from 6 to 7 days/week to 0 to 1 days/week per client report for at least 3 consecutive months. Verbally express understanding of the relationship between feelings of depression, anxiety and their impact on thinking patterns and behaviors. Verbalize an understanding of the role that distorted thinking plays in creating fears, excessive worry, and ruminations.    [name] participated in the creation of the treatment plan)    Mary Ovens, LCSW

## 2023-04-21 ENCOUNTER — Encounter (INDEPENDENT_AMBULATORY_CARE_PROVIDER_SITE_OTHER): Payer: Self-pay

## 2023-04-27 ENCOUNTER — Ambulatory Visit: Payer: BC Managed Care – PPO | Admitting: Psychology

## 2023-04-27 DIAGNOSIS — F900 Attention-deficit hyperactivity disorder, predominantly inattentive type: Secondary | ICD-10-CM | POA: Diagnosis not present

## 2023-04-27 DIAGNOSIS — F321 Major depressive disorder, single episode, moderate: Secondary | ICD-10-CM | POA: Diagnosis not present

## 2023-04-27 DIAGNOSIS — F411 Generalized anxiety disorder: Secondary | ICD-10-CM | POA: Diagnosis not present

## 2023-04-27 NOTE — Progress Notes (Unsigned)
[facility] Counselor/Therapist Progress Note  Patient ID: [name], MRN: [id number]   Date: 04/27/23  Time Spent: 6:02 pm - 6:53  pm : 51 Minutes  Treatment Type: Individual Therapy.  Reported Symptoms: depression, anxiety, and ADHD.   Mental Status Exam: Appearance:  Neat and Well Groomed     Behavior: Appropriate  Motor: Normal  Speech/Language:  Clear and Coherent  Affect: Congruent  Mood: normal  Thought process: normal  Thought content:   WNL  Sensory/Perceptual disturbances:   WNL  Orientation: oriented to person, place, time/date, and situation  Attention: Good  Concentration: Good  Memory: WNL  Fund of knowledge:  Good  Insight:   Good  Judgment:  Good  Impulse Control: Good   Risk Assessment: Danger to Self:  No Self-injurious Behavior: No Danger to Others: No Duty to Warn:no Physical Aggression / Violence:No  Access to Firearms a concern: No  Gang Involvement:No   In case of a mental health emergency:  91 - confidential suicide hotline. [facility]):        [address], [place] [address]       [phone] 3.   911  4.   Visiting Nearest ED.    Subjective:   [name] participated from home, via video, is aware of tele-session limitations, and consented to treatment. Therapist participated from office. [name] reviewed the events of the past week. [name] noted the upcoming transition with school starting for her children and the necessary schedule changes. She noted her continued effort to exercises regularly, which therapist praised. 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 She noted often thinking negatively and noted that people's excitement often results in a sense of pressure. She noted, at times, wanting to be the person who comes in to save the day. We identified an insight that she often thinks that how people feel  about her is a reflection of what they can do for her. She noted this being a fear in interactions with her husband despite having no history of this in their relationship. We explored this during the session. [place] discussed the influence of past relationships on her marriage. We worked on identifying possible evidence in her marriage regarding unhealthy dynamics and [name] was unable to identify any. She noted a need to discuss her experience with her husband. Therapist praised [name] for her vulnerability during the session. Therapist encouraged [name] to be mindful of her negative self-talk and to challenge this. Therapist provided supportive therapy and a follow-up was scheduled for continued treatment.    Interventions: CBT & interpersonal.  Diagnosis:  ADHD, predominantly inattentive type  MDD (major depressive disorder), single episode, moderate (HCC)  GAD (generalized anxiety disorder)  Psychiatric Treatment: Yes , Dr. Bayard Males MD. Please see chart.    Treatment Plan:  Client Abilities/Strengths [name] is intelligent, self-aware, and motivated for change.   Support System: Family and Friends.   Client Treatment Preferences OPT  Client Statement of Needs [name] would like to improve coping, manage day-to-day stressors, improve frustration tolerance, improve mindfulness, increase consistency with exercise and self-care, increasing time to self, manage negative self-talk, staying organized and on task,  think more positively.    Treatment Level Weekly  Symptoms  GAD: Anxious, difficulty managing worry, worrying about different things, trouble relaxing, irritability.    (Status: maintained) Depression: Feeling bad about self, negative self-talk, feelings of guilt.    (Status:  maintained)  Goals:   [name] experiences symptoms of depression, anxiety, and ADHD.   Target Date: 12/11/23 Frequency: Weekly  Progress: 0 Modality: individual    Therapist will provide  referrals for additional resources as appropriate.  Therapist will provide psycho-education regarding [name]'s diagnosis and corresponding treatment approaches and interventions. Licensed Clinical Social Worker, [place], LCSW will support the patient's ability to achieve the goals identified. will employ CBT, BA, Problem-solving, Solution Focused, Mindfulness,  coping skills, & other evidenced-based practices will be used to promote progress towards healthy functioning to help manage decrease symptoms associated with her diagnosis.   Reduce overall level, frequency, and intensity of the feelings of depression and anxiety as evidenced by decreased overall symptoms from 6 to 7 days/week to 0 to 1 days/week per client report for at least 3 consecutive months. Verbally express understanding of the relationship between feelings of depression, anxiety and their impact on thinking patterns and behaviors. Verbalize an understanding of the role that distorted thinking plays in creating fears, excessive worry, and ruminations.    [name] participated in the creation of the treatment plan)  Delight Ovens, LCSW

## 2023-05-14 ENCOUNTER — Ambulatory Visit: Payer: BC Managed Care – PPO | Admitting: Psychology

## 2023-05-14 DIAGNOSIS — F321 Major depressive disorder, single episode, moderate: Secondary | ICD-10-CM | POA: Diagnosis not present

## 2023-05-14 DIAGNOSIS — F900 Attention-deficit hyperactivity disorder, predominantly inattentive type: Secondary | ICD-10-CM | POA: Diagnosis not present

## 2023-05-14 DIAGNOSIS — F411 Generalized anxiety disorder: Secondary | ICD-10-CM

## 2023-05-14 NOTE — Progress Notes (Signed)
[facility] Counselor/Therapist Progress Note  Patient ID: [name], MRN: [id number]   Date: 05/14/23  Time Spent: 9:03 am - 9:56  am : 53 Minutes  Treatment Type: Individual Therapy.  Reported Symptoms: depression, anxiety, and ADHD.   Mental Status Exam: Appearance:  Neat and Well Groomed     Behavior: Appropriate  Motor: Normal  Speech/Language:  Clear and Coherent  Affect: Congruent  Mood: normal  Thought process: normal  Thought content:   WNL  Sensory/Perceptual disturbances:   WNL  Orientation: oriented to person, place, time/date, and situation  Attention: Good  Concentration: Good  Memory: WNL  Fund of knowledge:  Good  Insight:   Good  Judgment:  Good  Impulse Control: Good   Risk Assessment: Danger to Self:  No Self-injurious Behavior: No Danger to Others: No Duty to Warn:no Physical Aggression / Violence:No  Access to Firearms a concern: No  Gang Involvement:No   In case of a mental health emergency:  71 - confidential suicide hotline. [facility]):        [address], [place] [address]       [phone] 3.   911  4.   Visiting Nearest ED.    Subjective:   [name] participated from home, via video, is aware of tele-session limitations, and consented to treatment. Therapist participated from office. [place] reviewed the events of the past week. [name] noted having difficulty sleeping due to the anticipation of school starting for her children and noted having to do a meet and greet at school later this past evening. She noted feeling overwhelmed with making doctor appointments from her husband and noted anxiety regarding this as she noted identifying that "he does a lot". She noted ruminating regarding how this conversation might go and noted this being negative. She noted her role often being able to do "everything". Therapist encouraged [name] to employ her coping skills, challenge  negative self-talk, and identify ways to communicate her concerns about balance in household tasks. Therapist modeled positive communication, use of empathy, and assertiveness. We worked on exploring this during the session and she noted having one stressful discussion about religious beliefs that become a barrier to many conversations unrelated to religious beliefs and faith. She noted her anxiety and worry regarding the discussion about getting additional support regarding day-to-day tasks and worry that her husband might not want to maintain a relationship going forward due to religious differences. We explored this during the session and worked on feeling identification. Therapist praised [name] for her effort and energy during the session and provided supportive therapy. A follow-up was scheduled for continued therapy.   Interventions: CBT & interpersonal.  Diagnosis:  ADHD, predominantly inattentive type  MDD (major depressive disorder), single episode, moderate (HCC)  GAD (generalized anxiety disorder)  Psychiatric Treatment: Yes , Dr. Bayard Males MD. Please see chart.    Treatment Plan:  Client Abilities/Strengths [name] is intelligent, self-aware, and motivated for change.   Support System: Family and Friends.   Client Treatment Preferences OPT  Client Statement of Needs [name] would like to improve coping, manage day-to-day stressors, improve frustration tolerance, improve mindfulness, increase consistency with exercise and self-care, increasing time to self, manage negative self-talk, staying organized and on task,  think more positively.    Treatment Level Weekly  Symptoms  GAD: Anxious, difficulty managing worry, worrying about different things, trouble relaxing, irritability.    (Status: maintained) Depression: Feeling bad about self, negative self-talk, feelings  of guilt.    (Status: maintained)  Goals:   [name] experiences symptoms of depression, anxiety, and  ADHD.   Target Date: 12/11/23 Frequency: Weekly  Progress: 0 Modality: individual    Therapist will provide referrals for additional resources as appropriate.  Therapist will provide psycho-education regarding [name]'s diagnosis and corresponding treatment approaches and interventions. Licensed Clinical Social Worker, [place], LCSW will support the patient's ability to achieve the goals identified. will employ CBT, BA, Problem-solving, Solution Focused, Mindfulness,  coping skills, & other evidenced-based practices will be used to promote progress towards healthy functioning to help manage decrease symptoms associated with her diagnosis.   Reduce overall level, frequency, and intensity of the feelings of depression and anxiety as evidenced by decreased overall symptoms from 6 to 7 days/week to 0 to 1 days/week per client report for at least 3 consecutive months. Verbally express understanding of the relationship between feelings of depression, anxiety and their impact on thinking patterns and behaviors. Verbalize an understanding of the role that distorted thinking plays in creating fears, excessive worry, and ruminations.    Phineas Semen participated in the creation of the treatment plan)  Delight Ovens, LCSW

## 2023-05-17 ENCOUNTER — Encounter: Payer: BC Managed Care – PPO | Admitting: Nurse Practitioner

## 2023-05-31 ENCOUNTER — Ambulatory Visit (HOSPITAL_BASED_OUTPATIENT_CLINIC_OR_DEPARTMENT_OTHER): Payer: BC Managed Care – PPO | Admitting: Student

## 2023-05-31 VITALS — BP 150/98 | HR 67 | Ht 66.0 in | Wt 252.4 lb

## 2023-05-31 DIAGNOSIS — F339 Major depressive disorder, recurrent, unspecified: Secondary | ICD-10-CM | POA: Diagnosis not present

## 2023-05-31 DIAGNOSIS — F900 Attention-deficit hyperactivity disorder, predominantly inattentive type: Secondary | ICD-10-CM

## 2023-05-31 MED ORDER — VILOXAZINE HCL ER 200 MG PO CP24: 400.0000 mg | ORAL_CAPSULE | Freq: Every day | ORAL | 0 refills | Status: AC

## 2023-05-31 MED ORDER — METHYLPHENIDATE HCL 20 MG PO TABS: 30.0000 mg | ORAL_TABLET | Freq: Every day | ORAL | 0 refills | Status: AC

## 2023-05-31 MED ORDER — METHYLPHENIDATE HCL 20 MG PO TABS
30.0000 mg | ORAL_TABLET | Freq: Two times a day (BID) | ORAL | 0 refills | Status: AC
Start: 2023-05-31 — End: 2023-06-15

## 2023-05-31 MED ORDER — VILOXAZINE HCL ER 200 MG PO CP24: 200.0000 mg | ORAL_CAPSULE | Freq: Every day | ORAL | 0 refills | Status: AC
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
## 2023-06-02 ENCOUNTER — Encounter (HOSPITAL_COMMUNITY): Payer: Self-pay | Admitting: Student

## 2023-06-03 NOTE — Addendum Note (Signed)
Addended by: Everlena Cooper on: 06/03/2023 10:33 AM   Modules accepted: Level of Service

## 2023-06-04 ENCOUNTER — Ambulatory Visit (INDEPENDENT_AMBULATORY_CARE_PROVIDER_SITE_OTHER): Payer: BC Managed Care – PPO | Admitting: Psychology

## 2023-06-04 DIAGNOSIS — F900 Attention-deficit hyperactivity disorder, predominantly inattentive type: Secondary | ICD-10-CM | POA: Diagnosis not present

## 2023-06-04 DIAGNOSIS — F339 Major depressive disorder, recurrent, unspecified: Secondary | ICD-10-CM | POA: Diagnosis not present

## 2023-06-04 DIAGNOSIS — F411 Generalized anxiety disorder: Secondary | ICD-10-CM

## 2023-06-04 NOTE — Progress Notes (Signed)
[facility] Counselor/Therapist Progress Note  Patient ID: [name], MRN: [id number]   Date: 06/04/23  Time Spent: 10:04 am -11:02 am : 58  Minutes  Treatment Type: Individual Therapy.  Reported Symptoms: depression, anxiety, and ADHD.   Mental Status Exam: Appearance:  Neat and Well Groomed     Behavior: Appropriate  Motor: Normal  Speech/Language:  Clear and Coherent  Affect: Congruent  Mood: normal  Thought process: normal  Thought content:   WNL  Sensory/Perceptual disturbances:   WNL  Orientation: oriented to person, place, time/date, and situation  Attention: Good  Concentration: Good  Memory: WNL  Fund of knowledge:  Good  Insight:   Good  Judgment:  Good  Impulse Control: Good   Risk Assessment: Danger to Self:  Yes.  without intent/plan Self-injurious Behavior: No Danger to Others: No Duty to Warn:no Physical Aggression / Violence:No  Access to Firearms a concern: No  Gang Involvement:No   In case of a mental health emergency:  5 - confidential suicide hotline. Visiting [facility]al [facility] Ur[facility]):        [address], [place] [address]       [phone] 3.   911  4.   Visiting Nearest ED.    Subjective:   [name] participated from home, via video, is aware of tele-session limitations, and consented to treatment. Therapist participated from home office. [name] reviewed the events of the past week. [name] noted having a tough morning and noted this being due to the upcoming anniversary of an aunt who passed away. Additional contributing factors include academic stressors and family life. She noted recently going to her psychiatric provider and noted her blood pressure was higher than her typical reading. She noted feeling overwhelmed by the household tasks and experiencing worry that this conversation might not go well and that her husband would react poorly. She noted this leading to avoidance of the  conversation. We worked on processing this during the session and the possible contributing factors to her feelings. We worked on identifying ways to communicate needs while simplifying the process for her and allowing for direct and meaningful communication. [name] noted writing down her needs in a an explicit and direct way, such as bullet-points, to help guide her conversations or giving that list to her husband to read and respond to. [name] was engaged and motivated and expressed commitment towards goals. We reviewed safety due to recent SI and She was agreeable to the plan above, which was previously reviewed, and noted commitment towards safety and that she would discuss her concerns with her husband as well. We scheduled a follow-up and [name] will contact provider, ahead of schedule, should the need arise and to use the safety plan for guidance, should the need arise. Therapist validated [name]'s feelings, challenged jumps to conclusions, and provided positive evidence to challenge this. Therapist provided supportive therapy.    Interventions: CBT & interpersonal.  Diagnosis:  ADHD, predominantly inattentive type  Depression, recurrent (HCC)  GAD (generalized anxiety disorder)  Psychiatric Treatment: Yes ,[name], MD . Please see chart.    Treatment Plan:  Client Abilities/Strengths [name] is intelligent, self-aware, and motivated for change.   Support System: Family and Friends.   Client Treatment Preferences OPT  Client Statement of Needs [name] would like to improve coping, manage day-to-day stressors, improve frustration tolerance, improve mindfulness, increase consistency with exercise and self-care, increasing time to self, manage negative self-talk, staying organized and on task,  think more  positively.    Treatment Level Weekly  Symptoms  GAD: Anxious, difficulty managing worry, worrying about different things, trouble relaxing, irritability.    (Status:  maintained) Depression: Feeling bad about self, negative self-talk, feelings of guilt.    (Status: maintained)  Goals:   [name] experiences symptoms of depression, anxiety, and ADHD.   Target Date: 12/11/23 Frequency: Weekly  Progress: 0 Modality: individual    Therapist will provide referrals for additional resources as appropriate.  Therapist will provide psycho-education regarding [name]'s diagnosis and corresponding treatment approaches and interventions. Licensed Clinical Social Worker, [place], LCSW will support the patient's ability to achieve the goals identified. will employ CBT, BA, Problem-solving, Solution Focused, Mindfulness,  coping skills, & other evidenced-based practices will be used to promote progress towards healthy functioning to help manage decrease symptoms associated with her diagnosis.   Reduce overall level, frequency, and intensity of the feelings of depression and anxiety as evidenced by decreased overall symptoms from 6 to 7 days/week to 0 to 1 days/week per client report for at least 3 consecutive months. Verbally express understanding of the relationship between feelings of depression, anxiety and their impact on thinking patterns and behaviors. Verbalize an understanding of the role that distorted thinking plays in creating fears, excessive worry, and ruminations.    Phineas Semen participated in the creation of the treatment plan)  Delight Ovens, LCSW

## 2023-06-09 ENCOUNTER — Other Ambulatory Visit (HOSPITAL_COMMUNITY): Payer: Self-pay | Admitting: Student

## 2023-06-18 ENCOUNTER — Ambulatory Visit (INDEPENDENT_AMBULATORY_CARE_PROVIDER_SITE_OTHER): Payer: BC Managed Care – PPO | Admitting: Psychology

## 2023-06-18 DIAGNOSIS — F339 Major depressive disorder, recurrent, unspecified: Secondary | ICD-10-CM | POA: Diagnosis not present

## 2023-06-18 DIAGNOSIS — F900 Attention-deficit hyperactivity disorder, predominantly inattentive type: Secondary | ICD-10-CM | POA: Diagnosis not present

## 2023-06-18 DIAGNOSIS — F411 Generalized anxiety disorder: Secondary | ICD-10-CM

## 2023-06-18 NOTE — Progress Notes (Signed)
[facility] Counselor/Therapist Progress Note  Patient ID: [name], MRN: [id number]   Date: 06/18/23  Time Spent: 10:04 am - 10:55 am : 51 Minutes  Treatment Type: Individual Therapy.  Reported Symptoms: depression, anxiety, and ADHD.   Mental Status Exam: Appearance:  Neat and Well Groomed     Behavior: Appropriate  Motor: Normal  Speech/Language:  Clear and Coherent  Affect: Congruent  Mood: dysthymic  Thought process: normal  Thought content:   WNL  Sensory/Perceptual disturbances:   WNL  Orientation: oriented to person, place, time/date, and situation  Attention: Good  Concentration: Good  Memory: WNL  Fund of knowledge:  Good  Insight:   Good  Judgment:  Good  Impulse Control: Good   Risk Assessment: Danger to Self:  Yes.  without intent/plan Self-injurious Behavior: No Danger to Others: No Duty to Warn:no Physical Aggression / Violence:No  Access to Firearms a concern: No  Gang Involvement:No   In case of a mental health emergency:  988 - confidential suicide hotline. [facility]):        [address], [place] [address]       [phone] 3.   911  4.   Visiting Nearest ED.    Subjective:   [name] participated from home, via video, is aware of tele-session limitations, and consented to treatment. Therapist participated from home office. [name] reviewed the events of the past week.  She noted feeling better than our previous session. She noted beginning to communicate her needs with her husband and noted "he was rather receptive to it". She noted, in hind-sight, wondering why she was assuming he would be upset. She noted an insight that this worry is related to past family members' reaction to her feelings, needs, or requests. She noted her family being, in the past, being "rather dismissive", blow-up (mother), and having her feeling ignored. She noted her mother's alcohol use exacerbated  her reaction to [name]'s efforts to communicate needs and feelings. She noted her family's dismissive nature would lead to avoidance on her part. Therapist highlighted implicit and explicit pressure to not share feelings with others, even as an adult, and being critiqued for going to therapy by her grandmother. Therapist highlighted negative self-talk including "I can be a lot" & "I punish him" and challenged this during the session. We discussed the importance of mindfulness and worked on identifying ways to manage this going forward. We highlighted numerous questions to employ including "How do I feel? Where are these feelings from (past or present)? What can I do to manage the feelings? What is the self-talk like? What am I physically feeling (will log symptoms)?" We discussed the importance of challenging negative self-talk. [name] was committed to work on addressing this proactively going forward. Therapist modeled these tools and interventions during the session. She noted an improvement in her self-care and is walking 3x per week which has aided her mood and reduced her blood pressure. Therapist praised [name] for her effort and energy, provided supportive therapy, and a follow-up was scheduled.    Interventions: CBT & interpersonal.  Diagnosis:  ADHD, predominantly inattentive type  Depression, recurrent (HCC)  GAD (generalized anxiety disorder)  Psychiatric Treatment: Yes ,Mary Sprinkles, MD . Please see chart.    Treatment Plan:  Client Abilities/Strengths [name] is intelligent, self-aware, and motivated for change.   Support System: Family and Friends.   Client Treatment Preferences OPT  Client Statement of Needs [name] would like to  improve coping, manage day-to-day stressors, improve frustration tolerance, improve mindfulness, increase consistency with exercise and self-care, increasing time to self, manage negative self-talk, staying organized and on task,  think more  positively.    Treatment Level Weekly  Symptoms  GAD: Anxious, difficulty managing worry, worrying about different things, trouble relaxing, irritability.    (Status: maintained) Depression: Feeling bad about self, negative self-talk, feelings of guilt.    (Status: maintained)  Goals:   [name] experiences symptoms of depression, anxiety, and ADHD.   Target Date: 12/11/23 Frequency: Weekly  Progress: 0 Modality: individual    Therapist will provide referrals for additional resources as appropriate.  Therapist will provide psycho-education regarding [name]'s diagnosis and corresponding treatment approaches and interventions. Licensed Clinical Social Worker, [place], LCSW will support the patient's ability to achieve the goals identified. will employ CBT, BA, Problem-solving, Solution Focused, Mindfulness,  coping skills, & other evidenced-based practices will be used to promote progress towards healthy functioning to help manage decrease symptoms associated with her diagnosis.   Reduce overall level, frequency, and intensity of the feelings of depression and anxiety as evidenced by decreased overall symptoms from 6 to 7 days/week to 0 to 1 days/week per client report for at least 3 consecutive months. Verbally express understanding of the relationship between feelings of depression, anxiety and their impact on thinking patterns and behaviors. Verbalize an understanding of the role that distorted thinking plays in creating fears, excessive worry, and ruminations.    [name] participated in the creation of the treatment plan)  Delight Ovens, LCSW

## 2023-07-07 ENCOUNTER — Ambulatory Visit (HOSPITAL_COMMUNITY): Payer: BC Managed Care – PPO | Admitting: Student

## 2023-07-15 ENCOUNTER — Ambulatory Visit: Payer: BC Managed Care – PPO | Admitting: Psychology

## 2023-07-15 DIAGNOSIS — F411 Generalized anxiety disorder: Secondary | ICD-10-CM | POA: Diagnosis not present

## 2023-07-15 DIAGNOSIS — F900 Attention-deficit hyperactivity disorder, predominantly inattentive type: Secondary | ICD-10-CM

## 2023-07-15 DIAGNOSIS — F339 Major depressive disorder, recurrent, unspecified: Secondary | ICD-10-CM

## 2023-07-15 NOTE — Progress Notes (Signed)
[facility] Counselor/Therapist Progress Note  Patient ID: Monita Makin, MRN: [id number]   Date: 07/15/23  Time Spent: 10:03 am - 10:56 am : 53 Minutes  Treatment Type: Individual Therapy.  Reported Symptoms: depression, anxiety, and ADHD.   Mental Status Exam: Appearance:  Neat and Well Groomed     Behavior: Appropriate  Motor: Normal  Speech/Language:  Clear and Coherent  Affect: Congruent  Mood: dysthymic  Thought process: normal  Thought content:   WNL  Sensory/Perceptual disturbances:   WNL  Orientation: oriented to person, place, time/date, and situation  Attention: Good  Concentration: Good  Memory: WNL  Fund of knowledge:  Good  Insight:   Good  Judgment:  Good  Impulse Control: Good   Risk Assessment: Danger to Self:  Yes.  without intent/plan Self-injurious Behavior: No Danger to Others: No Duty to Warn:no Physical Aggression / Violence:No  Access to Firearms a concern: No  Gang Involvement:No   In case of a mental health emergency:  988 - confidential suicide hotline. [facility]        [address], [place] [address]       [phone] 3.   911  4.   Visiting Nearest ED.    Subjective:   [name] participated from home, via video, is aware of tele-session limitations, and consented to treatment. Therapist participated from home office. Lyli reviewed the events of the past week. She noted working on communicating more with her husband regarding her feelings regarding various issues. She noted feeling a little more closed off and that it's become more difficult to talk. She noted often being disappointed when she realizes that she knows very little about a topic and noted this causing distress.  She noted this in relation to her upcoming job search during the spring of next year. We worked on processing ways to manage this including Identifying feelings, (De-stress) ex. Walking, drawing,  Identifying "What is the primary issue?", "Do I have all the information I need to assess the situation?", How would others feel about the situation & is how I am feeling normal?", getting support from others in similar situations, and challenge negative self-talk. We worked on applying this to this current anxiety and therapist encouraged Charie to apply this going forward and we can discuss this during our follow-up. Therapist praised Kolbie for her effort during the session. She was engaged and motivated during the session and expressed commitment towards session goals.  Therapist praised Cheramie for her effort and energy and  provided supportive therapy. A follow-up was scheduled and Anda continues to benefit from treatment.   Interventions: CBT & interpersonal.  Diagnosis:  ADHD, predominantly inattentive type  Depression, recurrent (HCC)  GAD (generalized anxiety disorder)  Psychiatric Treatment: Yes ,[name], MD . Please see chart.    Treatment Plan:  Client Abilities/Strengths Modelle is intelligent, self-aware, and motivated for change.   Support System: Family and Friends.   Client Treatment Preferences OPT  Client Statement of Needs Dewanna would like to improve coping, manage day-to-day stressors, improve frustration tolerance, improve mindfulness, increase consistency with exercise and self-care, increasing time to self, manage negative self-talk, staying organized and on task,  think more positively.    Treatment Level Weekly  Symptoms  GAD: Anxious, difficulty managing worry, worrying about different things, trouble relaxing, irritability.    (Status: maintained) Depression: Feeling bad about self, negative self-talk, feelings of guilt.    (Status: maintained)  Goals:  Maia experiences symptoms of depression, anxiety, and ADHD.   Target Date: 12/11/23 Frequency: Weekly  Progress: 0 Modality: individual    Therapist will provide referrals for  additional resources as appropriate.  Therapist will provide psycho-education regarding Emberley's diagnosis and corresponding treatment approaches and interventions. Licensed Clinical Social Worker, [place], LCSW will support the patient's ability to achieve the goals identified. will employ CBT, BA, Problem-solving, Solution Focused, Mindfulness,  coping skills, & other evidenced-based practices will be used to promote progress towards healthy functioning to help manage decrease symptoms associated with her diagnosis.   Reduce overall level, frequency, and intensity of the feelings of depression and anxiety as evidenced by decreased overall symptoms from 6 to 7 days/week to 0 to 1 days/week per client report for at least 3 consecutive months. Verbally express understanding of the relationship between feelings of depression, anxiety and their impact on thinking patterns and behaviors. Verbalize an understanding of the role that distorted thinking plays in creating fears, excessive worry, and ruminations.    Phineas Semen participated in the creation of the treatment plan)  Delight Ovens, LCSW

## 2023-07-21 ENCOUNTER — Ambulatory Visit (HOSPITAL_COMMUNITY): Payer: BC Managed Care – PPO | Admitting: Student

## 2023-08-11 ENCOUNTER — Ambulatory Visit: Payer: BC Managed Care – PPO | Admitting: Psychology
